# Patient Record
Sex: Female | Born: 1950
Health system: Southern US, Community
[De-identification: ages and names within clinical notes are randomized; demographics above are authoritative.]

## PROBLEM LIST (undated history)

## (undated) DIAGNOSIS — M1990 Unspecified osteoarthritis, unspecified site: Secondary | ICD-10-CM

## (undated) DIAGNOSIS — R011 Cardiac murmur, unspecified: Secondary | ICD-10-CM

## (undated) DIAGNOSIS — K759 Inflammatory liver disease, unspecified: Secondary | ICD-10-CM

## (undated) DIAGNOSIS — Z8679 Personal history of other diseases of the circulatory system: Secondary | ICD-10-CM

## (undated) DIAGNOSIS — I10 Essential (primary) hypertension: Secondary | ICD-10-CM

## (undated) DIAGNOSIS — Z8619 Personal history of other infectious and parasitic diseases: Secondary | ICD-10-CM

## (undated) DIAGNOSIS — M25461 Effusion, right knee: Secondary | ICD-10-CM

## (undated) DIAGNOSIS — Z8719 Personal history of other diseases of the digestive system: Secondary | ICD-10-CM

## (undated) DIAGNOSIS — K222 Esophageal obstruction: Secondary | ICD-10-CM

## (undated) DIAGNOSIS — Z9889 Other specified postprocedural states: Secondary | ICD-10-CM

## (undated) DIAGNOSIS — M25562 Pain in left knee: Secondary | ICD-10-CM

## (undated) DIAGNOSIS — R002 Palpitations: Secondary | ICD-10-CM

## (undated) DIAGNOSIS — Z8661 Personal history of infections of the central nervous system: Secondary | ICD-10-CM

## (undated) DIAGNOSIS — K219 Gastro-esophageal reflux disease without esophagitis: Secondary | ICD-10-CM

## (undated) DIAGNOSIS — H269 Unspecified cataract: Secondary | ICD-10-CM

## (undated) DIAGNOSIS — S83209A Unspecified tear of unspecified meniscus, current injury, unspecified knee, initial encounter: Secondary | ICD-10-CM

## (undated) HISTORY — PX: JOINT REPLACEMENT: SHX530

## (undated) HISTORY — PX: OTHER SURGICAL HISTORY: SHX169

## (undated) HISTORY — PX: APPENDECTOMY: SHX54

## (undated) HISTORY — PX: DILATION AND CURETTAGE OF UTERUS: SHX78

## (undated) HISTORY — PX: TONSILLECTOMY AND ADENOIDECTOMY: SUR1326

## (undated) HISTORY — PX: TUBAL LIGATION: SHX77

---

## 1998-06-28 ENCOUNTER — Emergency Department (HOSPITAL_COMMUNITY): Admission: EM | Admit: 1998-06-28 | Discharge: 1998-06-28 | Payer: Self-pay | Admitting: Emergency Medicine

## 1998-07-15 ENCOUNTER — Encounter: Admission: RE | Admit: 1998-07-15 | Discharge: 1998-08-09 | Payer: Self-pay

## 1998-09-03 ENCOUNTER — Encounter: Admission: RE | Admit: 1998-09-03 | Discharge: 1998-10-25 | Payer: Self-pay

## 1998-11-22 ENCOUNTER — Encounter: Admission: RE | Admit: 1998-11-22 | Discharge: 1999-02-20 | Payer: Self-pay | Admitting: *Deleted

## 1999-03-31 ENCOUNTER — Encounter: Payer: Self-pay | Admitting: Neurological Surgery

## 1999-03-31 ENCOUNTER — Ambulatory Visit (HOSPITAL_COMMUNITY): Admission: RE | Admit: 1999-03-31 | Discharge: 1999-03-31 | Payer: Self-pay | Admitting: Neurological Surgery

## 1999-04-15 ENCOUNTER — Ambulatory Visit (HOSPITAL_COMMUNITY): Admission: RE | Admit: 1999-04-15 | Discharge: 1999-04-15 | Payer: Self-pay | Admitting: Neurological Surgery

## 1999-04-15 ENCOUNTER — Encounter: Payer: Self-pay | Admitting: Neurological Surgery

## 1999-04-29 ENCOUNTER — Encounter: Payer: Self-pay | Admitting: Neurological Surgery

## 1999-04-29 ENCOUNTER — Ambulatory Visit (HOSPITAL_COMMUNITY): Admission: RE | Admit: 1999-04-29 | Discharge: 1999-04-29 | Payer: Self-pay | Admitting: Neurological Surgery

## 1999-04-30 ENCOUNTER — Encounter: Payer: Self-pay | Admitting: Internal Medicine

## 1999-04-30 ENCOUNTER — Ambulatory Visit (HOSPITAL_COMMUNITY): Admission: RE | Admit: 1999-04-30 | Discharge: 1999-04-30 | Payer: Self-pay | Admitting: Internal Medicine

## 1999-05-06 ENCOUNTER — Encounter: Payer: Self-pay | Admitting: Internal Medicine

## 1999-05-06 ENCOUNTER — Ambulatory Visit (HOSPITAL_COMMUNITY): Admission: RE | Admit: 1999-05-06 | Discharge: 1999-05-06 | Payer: Self-pay | Admitting: Internal Medicine

## 1999-10-13 ENCOUNTER — Encounter
Admission: RE | Admit: 1999-10-13 | Discharge: 1999-10-14 | Payer: Self-pay | Admitting: Physical Medicine & Rehabilitation

## 2000-03-29 ENCOUNTER — Other Ambulatory Visit: Admission: RE | Admit: 2000-03-29 | Discharge: 2000-03-29 | Payer: Self-pay | Admitting: Obstetrics and Gynecology

## 2000-05-18 ENCOUNTER — Ambulatory Visit (HOSPITAL_COMMUNITY): Admission: RE | Admit: 2000-05-18 | Discharge: 2000-05-18 | Payer: Self-pay | Admitting: Obstetrics and Gynecology

## 2000-05-18 ENCOUNTER — Encounter: Payer: Self-pay | Admitting: Obstetrics and Gynecology

## 2000-11-16 ENCOUNTER — Encounter: Payer: Self-pay | Admitting: Physical Medicine & Rehabilitation

## 2000-11-16 ENCOUNTER — Encounter
Admission: RE | Admit: 2000-11-16 | Discharge: 2000-11-16 | Payer: Self-pay | Admitting: Physical Medicine & Rehabilitation

## 2001-01-18 ENCOUNTER — Encounter
Admission: RE | Admit: 2001-01-18 | Discharge: 2001-01-18 | Payer: Self-pay | Admitting: Physical Medicine & Rehabilitation

## 2001-01-18 ENCOUNTER — Encounter: Payer: Self-pay | Admitting: Physical Medicine & Rehabilitation

## 2001-02-04 ENCOUNTER — Encounter: Payer: Self-pay | Admitting: Physical Medicine & Rehabilitation

## 2001-02-04 ENCOUNTER — Encounter
Admission: RE | Admit: 2001-02-04 | Discharge: 2001-02-04 | Payer: Self-pay | Admitting: Physical Medicine & Rehabilitation

## 2001-02-25 ENCOUNTER — Encounter
Admission: RE | Admit: 2001-02-25 | Discharge: 2001-02-25 | Payer: Self-pay | Admitting: Physical Medicine & Rehabilitation

## 2001-02-25 ENCOUNTER — Encounter: Payer: Self-pay | Admitting: Physical Medicine & Rehabilitation

## 2001-06-07 ENCOUNTER — Encounter: Payer: Self-pay | Admitting: Obstetrics and Gynecology

## 2001-06-07 ENCOUNTER — Ambulatory Visit (HOSPITAL_COMMUNITY): Admission: RE | Admit: 2001-06-07 | Discharge: 2001-06-07 | Payer: Self-pay | Admitting: Obstetrics and Gynecology

## 2001-06-14 ENCOUNTER — Encounter: Admission: RE | Admit: 2001-06-14 | Discharge: 2001-06-14 | Payer: Self-pay | Admitting: Obstetrics and Gynecology

## 2001-06-14 ENCOUNTER — Encounter: Payer: Self-pay | Admitting: Obstetrics and Gynecology

## 2001-06-20 ENCOUNTER — Other Ambulatory Visit: Admission: RE | Admit: 2001-06-20 | Discharge: 2001-06-20 | Payer: Self-pay | Admitting: Obstetrics and Gynecology

## 2001-08-11 ENCOUNTER — Encounter: Payer: Self-pay | Admitting: Physical Medicine & Rehabilitation

## 2001-08-11 ENCOUNTER — Ambulatory Visit (HOSPITAL_COMMUNITY)
Admission: RE | Admit: 2001-08-11 | Discharge: 2001-08-11 | Payer: Self-pay | Admitting: Physical Medicine & Rehabilitation

## 2001-08-23 ENCOUNTER — Inpatient Hospital Stay (HOSPITAL_COMMUNITY): Admission: EM | Admit: 2001-08-23 | Discharge: 2001-08-25 | Payer: Self-pay | Admitting: Emergency Medicine

## 2001-08-23 ENCOUNTER — Encounter: Payer: Self-pay | Admitting: Emergency Medicine

## 2002-02-14 ENCOUNTER — Encounter: Payer: Self-pay | Admitting: Internal Medicine

## 2002-02-14 ENCOUNTER — Ambulatory Visit (HOSPITAL_COMMUNITY): Admission: RE | Admit: 2002-02-14 | Discharge: 2002-02-14 | Payer: Self-pay | Admitting: Internal Medicine

## 2002-06-23 ENCOUNTER — Other Ambulatory Visit: Admission: RE | Admit: 2002-06-23 | Discharge: 2002-06-23 | Payer: Self-pay | Admitting: Obstetrics and Gynecology

## 2002-06-28 ENCOUNTER — Ambulatory Visit (HOSPITAL_COMMUNITY): Admission: RE | Admit: 2002-06-28 | Discharge: 2002-06-28 | Payer: Self-pay | Admitting: Gastroenterology

## 2002-06-28 ENCOUNTER — Encounter: Payer: Self-pay | Admitting: Gastroenterology

## 2002-07-04 ENCOUNTER — Encounter: Payer: Self-pay | Admitting: Obstetrics and Gynecology

## 2002-07-04 ENCOUNTER — Ambulatory Visit (HOSPITAL_COMMUNITY): Admission: RE | Admit: 2002-07-04 | Discharge: 2002-07-04 | Payer: Self-pay | Admitting: Obstetrics and Gynecology

## 2002-07-05 ENCOUNTER — Encounter: Payer: Self-pay | Admitting: Gastroenterology

## 2002-07-05 ENCOUNTER — Ambulatory Visit (HOSPITAL_COMMUNITY): Admission: RE | Admit: 2002-07-05 | Discharge: 2002-07-05 | Payer: Self-pay | Admitting: Gastroenterology

## 2002-07-13 ENCOUNTER — Ambulatory Visit (HOSPITAL_COMMUNITY): Admission: RE | Admit: 2002-07-13 | Discharge: 2002-07-13 | Payer: Self-pay | Admitting: Gastroenterology

## 2002-07-20 HISTORY — PX: HYSTEROSCOPY WITH D & C: SHX1775

## 2002-07-20 HISTORY — PX: HYSTEROSCOPY W/D&C: SHX1775

## 2002-07-26 ENCOUNTER — Encounter: Payer: Self-pay | Admitting: Physical Medicine & Rehabilitation

## 2002-07-26 ENCOUNTER — Encounter
Admission: RE | Admit: 2002-07-26 | Discharge: 2002-07-26 | Payer: Self-pay | Admitting: Physical Medicine & Rehabilitation

## 2002-08-11 ENCOUNTER — Ambulatory Visit (HOSPITAL_COMMUNITY): Admission: RE | Admit: 2002-08-11 | Discharge: 2002-08-11 | Payer: Self-pay | Admitting: Obstetrics and Gynecology

## 2002-08-11 ENCOUNTER — Encounter (INDEPENDENT_AMBULATORY_CARE_PROVIDER_SITE_OTHER): Payer: Self-pay

## 2002-08-17 ENCOUNTER — Encounter: Payer: Self-pay | Admitting: General Surgery

## 2002-08-19 HISTORY — PX: LAPAROSCOPIC CHOLECYSTECTOMY: SUR755

## 2002-08-22 ENCOUNTER — Encounter: Payer: Self-pay | Admitting: General Surgery

## 2002-08-22 ENCOUNTER — Encounter (INDEPENDENT_AMBULATORY_CARE_PROVIDER_SITE_OTHER): Payer: Self-pay | Admitting: Specialist

## 2002-08-22 ENCOUNTER — Observation Stay (HOSPITAL_COMMUNITY): Admission: RE | Admit: 2002-08-22 | Discharge: 2002-08-23 | Payer: Self-pay | Admitting: General Surgery

## 2002-11-15 ENCOUNTER — Encounter
Admission: RE | Admit: 2002-11-15 | Discharge: 2003-02-13 | Payer: Self-pay | Admitting: Physical Medicine & Rehabilitation

## 2003-03-19 ENCOUNTER — Encounter
Admission: RE | Admit: 2003-03-19 | Discharge: 2003-06-17 | Payer: Self-pay | Admitting: Physical Medicine & Rehabilitation

## 2003-10-01 ENCOUNTER — Other Ambulatory Visit: Admission: RE | Admit: 2003-10-01 | Discharge: 2003-10-01 | Payer: Self-pay | Admitting: Obstetrics and Gynecology

## 2003-10-08 ENCOUNTER — Emergency Department (HOSPITAL_COMMUNITY): Admission: EM | Admit: 2003-10-08 | Discharge: 2003-10-08 | Payer: Self-pay | Admitting: Emergency Medicine

## 2003-11-20 ENCOUNTER — Ambulatory Visit (HOSPITAL_COMMUNITY): Admission: RE | Admit: 2003-11-20 | Discharge: 2003-11-20 | Payer: Self-pay | Admitting: Obstetrics and Gynecology

## 2003-12-19 ENCOUNTER — Encounter
Admission: RE | Admit: 2003-12-19 | Discharge: 2004-03-18 | Payer: Self-pay | Admitting: Physical Medicine & Rehabilitation

## 2004-04-20 DIAGNOSIS — K759 Inflammatory liver disease, unspecified: Secondary | ICD-10-CM

## 2004-04-20 HISTORY — DX: Inflammatory liver disease, unspecified: K75.9

## 2004-06-13 ENCOUNTER — Ambulatory Visit (HOSPITAL_COMMUNITY): Admission: RE | Admit: 2004-06-13 | Discharge: 2004-06-13 | Payer: Self-pay | Admitting: Gastroenterology

## 2004-06-16 ENCOUNTER — Ambulatory Visit: Payer: Self-pay | Admitting: Gastroenterology

## 2004-07-09 ENCOUNTER — Ambulatory Visit: Payer: Self-pay | Admitting: Gastroenterology

## 2004-08-08 ENCOUNTER — Ambulatory Visit: Payer: Self-pay | Admitting: Gastroenterology

## 2004-09-29 ENCOUNTER — Encounter: Admission: RE | Admit: 2004-09-29 | Discharge: 2004-09-29 | Payer: Self-pay | Admitting: Internal Medicine

## 2004-09-30 ENCOUNTER — Other Ambulatory Visit: Admission: RE | Admit: 2004-09-30 | Discharge: 2004-09-30 | Payer: Self-pay | Admitting: Obstetrics and Gynecology

## 2004-11-20 ENCOUNTER — Encounter
Admission: RE | Admit: 2004-11-20 | Discharge: 2005-02-18 | Payer: Self-pay | Admitting: Physical Medicine & Rehabilitation

## 2004-11-20 ENCOUNTER — Ambulatory Visit: Payer: Self-pay | Admitting: Physical Medicine & Rehabilitation

## 2004-11-25 ENCOUNTER — Ambulatory Visit: Payer: Self-pay | Admitting: Gastroenterology

## 2004-12-19 ENCOUNTER — Encounter (INDEPENDENT_AMBULATORY_CARE_PROVIDER_SITE_OTHER): Payer: Self-pay | Admitting: *Deleted

## 2004-12-19 ENCOUNTER — Ambulatory Visit (HOSPITAL_COMMUNITY): Admission: RE | Admit: 2004-12-19 | Discharge: 2004-12-19 | Payer: Self-pay | Admitting: Gastroenterology

## 2005-05-28 ENCOUNTER — Encounter
Admission: RE | Admit: 2005-05-28 | Discharge: 2005-08-26 | Payer: Self-pay | Admitting: Physical Medicine & Rehabilitation

## 2005-05-28 ENCOUNTER — Ambulatory Visit: Payer: Self-pay | Admitting: Physical Medicine & Rehabilitation

## 2005-06-19 ENCOUNTER — Ambulatory Visit: Payer: Self-pay | Admitting: Gastroenterology

## 2005-09-24 ENCOUNTER — Ambulatory Visit: Payer: Self-pay | Admitting: Gastroenterology

## 2005-10-01 ENCOUNTER — Ambulatory Visit: Payer: Self-pay | Admitting: Gastroenterology

## 2005-10-15 ENCOUNTER — Ambulatory Visit: Payer: Self-pay | Admitting: Gastroenterology

## 2005-10-29 ENCOUNTER — Ambulatory Visit: Payer: Self-pay | Admitting: Gastroenterology

## 2005-11-16 ENCOUNTER — Encounter
Admission: RE | Admit: 2005-11-16 | Discharge: 2006-02-14 | Payer: Self-pay | Admitting: Physical Medicine & Rehabilitation

## 2005-11-19 ENCOUNTER — Ambulatory Visit: Payer: Self-pay | Admitting: Gastroenterology

## 2005-12-10 ENCOUNTER — Ambulatory Visit: Payer: Self-pay | Admitting: Gastroenterology

## 2005-12-24 ENCOUNTER — Ambulatory Visit: Payer: Self-pay | Admitting: Gastroenterology

## 2006-01-07 ENCOUNTER — Ambulatory Visit: Payer: Self-pay | Admitting: Gastroenterology

## 2006-02-04 ENCOUNTER — Ambulatory Visit: Payer: Self-pay | Admitting: Gastroenterology

## 2006-02-05 ENCOUNTER — Ambulatory Visit: Payer: Self-pay | Admitting: Physical Medicine & Rehabilitation

## 2006-03-04 ENCOUNTER — Ambulatory Visit: Payer: Self-pay | Admitting: Gastroenterology

## 2006-04-01 ENCOUNTER — Ambulatory Visit: Payer: Self-pay | Admitting: Gastroenterology

## 2006-04-09 ENCOUNTER — Ambulatory Visit (HOSPITAL_COMMUNITY): Admission: RE | Admit: 2006-04-09 | Discharge: 2006-04-09 | Payer: Self-pay | Admitting: Obstetrics and Gynecology

## 2006-04-29 ENCOUNTER — Ambulatory Visit: Payer: Self-pay | Admitting: Gastroenterology

## 2006-05-27 ENCOUNTER — Ambulatory Visit: Payer: Self-pay | Admitting: Gastroenterology

## 2006-06-10 ENCOUNTER — Emergency Department (HOSPITAL_COMMUNITY): Admission: EM | Admit: 2006-06-10 | Discharge: 2006-06-10 | Payer: Self-pay | Admitting: Family Medicine

## 2006-07-22 ENCOUNTER — Ambulatory Visit: Payer: Self-pay | Admitting: Gastroenterology

## 2006-08-24 ENCOUNTER — Ambulatory Visit: Payer: Self-pay | Admitting: Gastroenterology

## 2006-09-21 ENCOUNTER — Ambulatory Visit: Payer: Self-pay | Admitting: Gastroenterology

## 2006-11-18 ENCOUNTER — Ambulatory Visit: Payer: Self-pay | Admitting: Gastroenterology

## 2007-03-03 ENCOUNTER — Ambulatory Visit: Payer: Self-pay | Admitting: Gastroenterology

## 2007-08-11 ENCOUNTER — Ambulatory Visit: Payer: Self-pay | Admitting: Gastroenterology

## 2009-11-24 ENCOUNTER — Emergency Department (HOSPITAL_COMMUNITY): Admission: EM | Admit: 2009-11-24 | Discharge: 2009-11-24 | Payer: Self-pay | Admitting: Family Medicine

## 2010-01-05 ENCOUNTER — Ambulatory Visit (HOSPITAL_COMMUNITY): Admission: RE | Admit: 2010-01-05 | Discharge: 2010-01-05 | Payer: Self-pay | Admitting: Orthopedic Surgery

## 2010-05-11 ENCOUNTER — Encounter: Payer: Self-pay | Admitting: Obstetrics and Gynecology

## 2010-07-17 ENCOUNTER — Inpatient Hospital Stay (INDEPENDENT_AMBULATORY_CARE_PROVIDER_SITE_OTHER)
Admission: RE | Admit: 2010-07-17 | Discharge: 2010-07-17 | Disposition: A | Discharge status: Home / Self Care | Payer: 59 | Source: Ambulatory Visit | Attending: Family Medicine | Admitting: Family Medicine

## 2010-07-17 DIAGNOSIS — J019 Acute sinusitis, unspecified: Secondary | ICD-10-CM

## 2010-09-05 NOTE — Assessment & Plan Note (Signed)
Ms. Eickhoff returns to clinic today for followup evaluation. Overall, she is  doing well. She continues to use minimal amounts of hydrocodone, Ultram,  Neurontin, and Flexeril. She does need refills on each of those in the  office today. She continues to work at [employer] and is doing well there.  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   The patient continues on the Pegasus and ribavirin through the hepatology  office at [facility]. She continues to be seen by [facility] physician, Dr. Foy Guadalajara,  who apparently is a specialist in hepatitis C infection.   MEDICATIONS:  1. Vicodin 5/325 1/2 tablet 1 tablet b.i.d. p.r.n. (usually 0-1 per day).  2. Ultram 50 mg b.i.d. p.r.n. (0-1 per day).  3. Neurontin 300 mg daily.  4. Flexeril 10 mg 1/2 tablet to 1 tablet daily.  5. Nexium 40 mg daily.  6. Spironolactone 25 mg b.i.d.  7. Diovan/hydrochlorothiazide 160/25 one tablet daily.  8. Topamax 50 mg b.i.d.  9. Allegra daily.  10.Ribavirin b.i.d.  11.Pegasus injections weekly.   REVIEW OF SYSTEMS:  Positive for nausea, abdominal pain, and right ear fluid  with most of her side effects secondary to treatment for hepatitis C.   PHYSICAL EXAMINATION:  A well-appearing, fit, adult female in mild-to-no-  acute discomfort. Blood pressure 110/73 with pulse 77, respiratory rate 16,  and O2 saturation 100% on room air. She has 5-/5 strength throughout the  bilateral upper and lower extremities. She has decreased range of motion of  her lumbar spine but ambulates without any assistive device.   IMPRESSION:  1. Moderate lumbar spondylosis with chronic knee pain, improved.  2. Degenerative arthritis of the left knee.   In the office today, we did refill the patient's Ultram, Neurontin,  Flexeril, and Vicodin, each for a total of 3 months. She has a mail-in  through her work at [employer] and  that will be necessary to get it  in a 72-month supply. Will plan on seeing the patient in followup in this  office in approximately 6 months' time with refills prior to that  appointment as necessary.           ______________________________  Ellwood Dense, M.D.     DC/MedQ  D:  02/05/2006 13:00:46  T:  02/07/2006 10:04:35  Job #:  [id number]

## 2010-09-05 NOTE — Assessment & Plan Note (Signed)
HISTORY OF PRESENT ILLNESS:  Ms. Hudock returns to the clinical today for  followup evaluation. She reports that overall, her back and hip pain are  about the same. She continues to get good relief from the medicines but  tries to use them only as absolutely necessary. She apparently had a recent  elevated liver enzymes and was seen by recent Hepatology group. She had a  liver biopsy but is not sure of the results at this point. She has not had  any contact with the people that did the biopsy and that was done back in  September. I have encouraged her to follow up with those people.   MEDICATIONS:  1.  Vicodin 5/325, 1/2 tablet to 1 tablet b.i.d. p.r.n. (usually 0 to 1 per      day).  2.  Ultram 50 mg b.i.d. p.r.n. (0 to 1 per day).  3.  Neurontin 300 mg daily.  4.  Flexeril 10 mg 1/2 tablet to 1 tablet daily.  5.  Nexium 40 mg daily.  6.  Spironolactone 25 mg b.i.d.  7.  Diovan-hydrochlorothiazide 160/25 1 tablet daily.  8.  Topamax 50 mg b.i.d.   PHYSICAL EXAMINATION:  GENERAL:  A reasonably well-appearing adult female.  VITAL SIGNS:  Blood pressure 100/59 with a pulse of 75, respiratory rate 16,  and O2 saturation 100% on room air.  NEUROLOGIC:  She ambulates without any assistive device. Strength was 5  minus over 5 throughout.   IMPRESSION:  1.  Moderate lumbar spondylosis with chronic knee pain, improved.  2.  Degenerative arthritis of the left knee.   PLAN:  In the office today, we did give the patient refill on her  medications and specifically for a 3 month supply including the Ultram,  Neurontin, Flexeril, and Vicodin. We will plan on seeing her in followup in  approximately 6 months' time.           ______________________________  Ellwood Dense, M.D.     DC/MedQ  D:  05/29/2005 16:00:02  T:  05/30/2005 14:15:01  Job #:  [id number]

## 2010-09-05 NOTE — H&P (Signed)
NAME:  Vicki Krueger, CALEF NO.:  [id number]   MEDICAL RECORD NO.:  [id number]                   PATIENT TYPE:  AMB   LOCATION:  SDC                                  FACILITY:  [facility]   PHYSICIAN:  Osborn Coho, M.D.                DATE OF BIRTH:  [birth year]   DATE OF ADMISSION:  DATE OF DISCHARGE:                                HISTORY & PHYSICAL   CHIEF COMPLAINT:  Heavier menstrual cycles for the past 2 years with cycles  sometimes twice per month.   HISTORY OF PRESENT ILLNESS:  The patient is a 60 year old gravida 4, para 1  with last menstrual period of June 23, 2002, who [facility] 23, [facility], complaining of heavier menstrual  cycles for the past 2 years with 2 cycles in May 2003 and no menses since  that time. The patient reported a history of fibroids for approximately 5 to  10 years and had been status post a myomectomy.   More recently the patient reports intramenstrual spotting. An endometrial  biopsy in September 2003 revealed an endocervical polyp and proliferative  type endometrium with breakdown. The patient reported a history of using  Naprosyn. The patient was offered observation versus hysteroscopy and  dilatation and curettage versus sonohystogram at that time.   She opted for observation until March of 2004, when she underwent a  sonohystogram which showed a 1.2 cm echogenic focus in the endocervical  region suggestive of an endocervical polyp. The patient was again offered  observation versus surgical management, and the patient opted for surgical  management. A hysteroscopy dilatation and curettage was discussed with the  patient. The risks, benefits and alternatives were reviewed including but  not limited to the risks of bleeding, infection, injury and uterine  perforation. The patient was scheduled for hysteroscopy dilatation and  curettage on August 11, 2002.   OBSTETRIC HISTORY:  Cesarean  section x1 at 32 weeks secondary to PPROM with  failure to progress.   GYNECOLOGIC HISTORY:  History of regular menses until about 9 months ago  with irregular menses since and heavier bleeding for the past couple of  years. The patient denied a history of gonorrhea, Chlamydia or other  sexually transmitted diseases. She denied a history of hormonal treatment.  Her last Pap smear was in March 2004 which was negative. She denied a  history of abnormal Pap smears. She reported being diagnosed with fibroids  approximately 5 to 10 years ago. She reported a history of an ovarian cyst  years ago with no masses seen on last ultrasound on July 03, 2002. Last  mammogram was in March of 2004, which was negative.   PAST MEDICAL HISTORY:  1. Hypertension.  2. Heart murmur requiring antibiotics with dental procedures.  3. Arthritis.  4. Chronic back pain.  5. Left knee pain.   PAST SURGICAL HISTORY:  1. Cesarean section.  2. Left knee surgery.  3. Bilateral tubal ligation.  4. Diagnostic laparoscopy x3.  5. Appendectomy.  6. Tonsillectomy.  7. Myomectomy.  8. Dilatation and curettage.  9. Gastroesophageal reflux disease.  10.      Questionable irritable bowel syndrome.  11.      Questionable hiatal hernia.  12.      Gallbladder sludge.   MEDICATIONS:  1. Atenolol.  2. Diovan.  3. Norvasc.   SOCIAL HISTORY:  Denies a history of cigarette use, alcohol use, drug use.  She lives with her husband and son.   FAMILY HISTORY:  Father with heart disease and lung cancer.   REVIEW OF SYSTEMS:  Denies a history of chest pain or shortness of breath.  She reports a history of nausea secondary to gallbladder sludge. She reports  occasional intermittent episodes of constipation and diarrhea secondary to  irritable bowel syndrome. She denied a history of urinary symptoms except  occasional episodes of urgency. The patient is in the process of undergoing  a gastrointestinal workup with Dr. Victorino Dike and is scheduled to  undergo  a cholecystectomy on Aug 22, 2002, secondary to gallbladder sludge.   LABORATORY DATA:  In September 2003 TSH was normal at 2.026. Prolactin  normal at 11.1. SSH 17.5. Hematocrit 40.9.   Ultrasound on December 26, 2001, showed a uterus that was  11.8 x 5.6 x 5.6 cm with multiple fibroids, largest measuring  3.2 x 2.6 x 2.7 cm. Endometrial lining measuring 4.9 mm. No adnexal masses.  Endometrial biopsy December 23, 2001, showed proliferative type endometrium  with breakdown and benign endocervical polyp. Sonohystogram on July 03, 2002, showed a uterus measuring  12.4 x 6.4 x 7.6 cm, again with multiple fibroids. A nabothian cyst was  noted on the cervix measuring 3.8 mm. The endometrial lining measured 2.8  mm. No adnexal masses were noted. A 1.2-cm echogenic focus in the  endocervical region suggestive of a polyp. Mammogram July 04, 2002, was  negative. Pap smear June 23, 2002, was negative.   PHYSICAL EXAMINATION:  VITAL SIGNS:  Afebrile, vital signs stable. Blood  pressure has been 130s/80s in the office.  HEENT:  Normal.  NECK:  Thyroid not enlarged.  HEART:  Regular rate and rhythm.  CHEST:  Clear to auscultation bilaterally.  BREASTS:  No masses, discharge, skin changes or nipple retraction.  BACK:  No CVA tenderness.  ABDOMEN:  Soft, nontender without masses.  EXTREMITIES:  Grossly within normal limits.  PELVIC:  External genitalia were normal. Vagina was normal. Cervix was  nontender with no lesions. Uterus approximately 10 to 12 week size,  anteverted, nontender. Adnexa was  nontender without masses.    ASSESSMENT AND PLAN:  The patient is a 60 year old para 1, perimenopausal  female with menometrorrhagia and endocervical polyp on endometrial biopsy  and sonohystogram. She is scheduled to undergo a hysteroscopy dilatation and curettage on August 11, 2002, after the risks, benefits and alternatives were  discussed with the patient as  stated above. Preoperative labs to be done.  Consent to be signed and witnessed the day of the procedure.                                               Osborn Coho, M.D.    AR/MEDQ  D:  08/11/2002  T:  08/11/2002  Job:  [id number]

## 2010-09-05 NOTE — Op Note (Signed)
NAME:  Vicki Krueger, Vicki Krueger                          ACCOUNT NO.:  [id number]   MEDICAL RECORD NO.:  [id number]                   PATIENT TYPE:  AMB   LOCATION:  DAY                                  FACILITY:  [facility]   PHYSICIAN:  Timothy E. Earlene Plater, M.D.              DATE OF BIRTH:  05/09/1950   DATE OF PROCEDURE:  08/22/2002  DATE OF DISCHARGE:                                 OPERATIVE REPORT   PREOPERATIVE DIAGNOSIS:  Cholecystitis with biliary dyskinesia.   POSTOPERATIVE DIAGNOSIS:  Cholecystitis with biliary dyskinesia.   OPERATION PERFORMED:  Laparoscopic cholecystectomy with intraoperative  cholangiogram.   SURGEON:  Timothy E. Earlene Plater, M.D.   ASSISTANT:  Anselm Pancoast. Zachery Dakins, M.D.   ANESTHESIA:  CRNA supervised Dr. Almeta Monas.   INDICATIONS FOR PROCEDURE:  The patient is 52, has hypertension, arthritis,  chronic back pain, obesity, reflux and has had for years, right upper  quadrant pain associated with food intake.  A recent GI work-up by Dr. Victorino Dike was negative except for abnormal HIDA scan.  After careful  consideration and discussion, the patient wishes to proceed with removal of  the gallbladder.  This has been carefully discussed with her.  Her  laboratory data are satisfactory except for elevated SGOT and SGPT today.   DESCRIPTION OF PROCEDURE:  The patient was taken to the operating room and  placed supine.  General endotracheal anesthesia administered.  The abdomen  prepped and draped in the usual fashion.  Marcaine 0.5% with epinephrine was  used prior to each incision.  A horizontal infraumbilical incision was made,  the fascia identified, opened vertically.  The peritoneum entered without  complication, Hasson catheter placed, tied in place with a #1 Vicryl.  Abdomen insufflated.  Peritoneoscopy was carried out showing an enlarged  uterus with fibroids and a prominent right ovary.  These are known features.   A 10 mm trocar placed in the midepigastrium, two 5 mm  trocars in the right  upper quadrant.  The gallbladder was grasped, inspected, appeared normal in  its overall appearance.  Careful dissection at the base of the gallbladder  revealed an anterior cystic artery which was separated from the cystic duct.  The artery was triply clipped and divided.  The cystic duct was dissected  out.  It was quite small.  A clip was placed near the gallbladder on the  cystic duct.  The cystic duct was opened and it was tiny.  I was, however,  able to thread a Reddick catheter into the cystic duct.  The balloon was  inflated and then using real time fluoroscopy with half strength dye, a  cholangiogram performed.  The balloon did partially block the common bile  duct.  It was slightly deflated and the dye then completely opacified the  hepatic ductal system, as well the common duct and common bile duct.  There  was normal anatomy and free flow  of dye to the duodenum.  We thought this  was normal.  The Reddick catheter was removed.  The stump of the cystic duct  was carefully clipped twice.  The duct was divided and then the gallbladder  was removed from the gallbladder bed without incident of complication.  The  gallbladder was removed through the infraumbilical incision, passed off the  field and submitted for pathology.  The gallbladder bed was dry and there  was no bile or blood.  Irrigant was completely clear.  All irrigant, CO2,  instruments and trocars were removed under  direct vision and the procedure was complete.  Instrument and glove counts  correct.  The skin incisions were closed with 3-0 Monocryl and Steri-Strips.  Small bandages applied.  Final count was correct.  The patient tolerated the  procedure well, was awakened and taken to the recovery room in good  condition.                                                Timothy E. Earlene Plater, M.D.    TED/MEDQ  D:  08/22/2002  T:  08/22/2002  Job:  [id number]   cc:   Ulyess Mort, M.D. [facility]    Candyce Churn, M.D.  [address] [place]  [place] [address]  Fax: [phone]

## 2010-09-05 NOTE — Discharge Summary (Signed)
[place]  Patient:    OMAR, ORREGO Visit Number: [id number] MRN: [id number]          Service Type: MED Location: 3000 [id number] Attending Physician:  Pearla Dubonnet Dictated by:   Pearla Dubonnet, M.D. Admit Date:  08/23/2001 Discharge Date: 08/25/2001                             Discharge Summary  DISCHARGE DIAGNOSES: 1. Aseptic meningitis, recurrent. 2. Hypertension. 3. Lumbar and degenerative joint disease. 4. Knee degenerative joint disease. 5. History of migraine headaches. 6. Aortic sclerosis.  DISCHARGE MEDICATIONS:  Neurontin 300 mg b.i.d., Hyzaar 100/25 one daily, Atenolol 50 mg daily, indomethacin 50 mg 1 p.o. b.i.d. p.r.n. headache/neck pain, Protonix 40 mg daily while taking indomethacin, Darvocet-N 100 one orally 2-3 times a day for joint pain after Tylox is finished, Tylox 1-2 orally every 6-8 hours as needed for severe neck pain/headache.  PROCEDURES:  Lumbar puncture, Aug 23, 2001.  HOSPITAL COURSE:  Mrs. Brenn is a very pleasant 60 year old female who had an episode of viral meningitis in the 3s and another one in approximately 1996.  Two days prior to admission, she began to develop headache but interestingly enough had stopped her Vioxx therapy for approximately 48 hours which she takes chronically for DJD of the spine and knees.  She thought that this headache was consistent with prior viral meningitis headaches and came to the [facility] Emergency Room for evaluation and lumbar puncture was consistent with viral meningitis as below.  She was admitted and treated with IV Demerol and IV Toradol with excellent relief of pain over the next 24-48 hours and is now being discharged home on indomethacin and Tylox for continued anti-inflammatory therapy and pain control.  She is eating well, ambulating, and much improved with a supple neck although slightly painful with movement, and she will be followed up  next week in my office.  LABORATORY:  On admission, were as follows:  White count 8000, hemoglobin 15.5, platelets 190,000.  She had 73% polys on white count differential.  Lumbar puncture results reveal the following: 1. CSF had 885 white cells, 23 red cells. 2. Differential and white cells were 81 lymphocytes, 5% polys, and 14%    monocytes. 3. Glucose was normal at 55.  CSF protein was elevated at 194, and gram stain    was negative for organisms. 4. BMET was normal on admission except for a potassium elevated at 5.8 with    specimens hemolyzed.  BMET on the second day of admission, Aug 24, 2001,    revealed a low potassium of 3.0 and with IV replacement and oral    replacement, her potassium was 3.9 on discharge.  Hypertension was controlled while hospitalized.  Her menses did start while hospitalized.  On discharge, again, her neck was supple.  Her headache was much improved and she was to be discharged home on the above medications and to follow up with me in approximately 1 week.  She may return to work on Aug 29, 2001, if headache is well controlled, off Indocin and Tylox.  She will call me in the interim if symptoms worsen or if any visual changes, nausea, vomiting, or severe headache occurs.   Dictated by:   Pearla Dubonnet, M.D. Attending Physician:  Pearla Dubonnet DD:  08/25/01 TD:  08/26/01 Job: [id number]

## 2010-09-05 NOTE — Assessment & Plan Note (Signed)
Ms. Layne returns to the clinic today for follow-up evaluation.  She does  report that she has had some increased back pain recently as her mother has  been hospitalized at the hospital for recent TIA/stroke.  The patient has  been trying to  help her in the room as much as possible.  Apparently, the  patient's mother is doing reasonably well but is planning to move to the  rehabilitation unit today.   The patient continues to use her below noted medications with good relief.   MEDICATIONS:  1. Atenolol 50 mg daily.  2. Norvasc 5 mg daily.  3. Diovan/hydrochlorothiazide 160/25 one tablet daily.  4. Ultram 50 mg b.i.d. p.r.n.  5. Neurontin 300 mg b.i.d.  6. Flexeril 10 mg one tablet daily p.r.n.  7. Darvocet-N 100 one to two tablets p.o. daily p.r.n.   PHYSICAL EXAMINATION:  A well-appearing moderately overweight adult female.  Blood pressure 111/77 with pulse 63 and O2 saturation 98% on room air.  She  has strength of 5-/5 throughout the bilateral upper and lower extremities.  Bulk and tone were normal and reflexes were 2+ and symmetrical.  The patient  ambulates without any assistive device.   IMPRESSION:  1. Moderate lumbar spondylosis with chronic low back pain.  2. Degenerative arthritis of the left knee.   In the office today, I did refill her Ultram, Darvocet, Flexeril, and  Neurontin medications.  She continues to do well on these medications  without adverse side effects.  I will plan on seeing her in follow-up in  approximately six months' time.      Ellwood Dense, M.D.   DC/MedQ  D:  05/11/2003 10:47:03  T:  05/11/2003 11:14:10  Job #:  [id number]

## 2010-09-05 NOTE — Op Note (Signed)
   NAME:  Vicki Krueger, Vicki Krueger                          ACCOUNT NO.:  [id number]   MEDICAL RECORD NO.:  [id number]                   PATIENT TYPE:  AMB   LOCATION:  SDC                                  FACILITY:  [facility]   PHYSICIAN:  Osborn Coho, M.D.                DATE OF BIRTH:  02-Dec-1950   DATE OF PROCEDURE:  08/11/2002  DATE OF DISCHARGE:                                 OPERATIVE REPORT   PREOPERATIVE DIAGNOSES:  1. Menometrorrhagia.  2. Perimenopause.  3. Endocervical polyp.   POSTOPERATIVE DIAGNOSES:  1. Menometrorrhagia.  2. Perimenopause.   PROCEDURE:  1. Hysteroscopy.  2. Dilatation and curettage.   ANESTHESIA:  General.   ATTENDING:  Osborn Coho, M.D.   FLUIDS:  500 mL.   HYSTEROSCOPIC FLUID DEFICIT:  80 mL.   URINE OUTPUT:  Approximately 50 mL via straight catheter prior to procedure.   COMPLICATIONS:  None.   ESTIMATED BLOOD LOSS:  Minimal, less than 10 mL.   PATHOLOGY:  Endometrial curetting.   PROCEDURE:  The patient was taken to the operating room after the risks,  benefits, and alternatives discussed with patient.  The patient verbalized  understanding and consent signed and witnessed.  The patient was placed  under general anesthesia and prepped and draped in a normal sterile fashion  in a dorsal lithotomy position.  A bivalve speculum was placed in the  patient's vagina and the anterior lip of the cervix grasped with a single  tooth tenaculum.  A paracervical block was administered using approximately  10 mL of 1% lidocaine.  The cervix was dilated for passage of the diagnostic  hysteroscope.  Diagnostic hysteroscope was introduced and no obvious lesions  identified.  Curettage was then performed and a gritty texture noted  throughout.  Diagnostic hysteroscope was introduced again  and again no lesions noted and particular attention paid to the endocervical  region where there were no lesions identified.  Instruments were removed.  Sponge count was  correct.  The patient tolerated procedure well and was  returned to the recovery room in stable condition.                                               Osborn Coho, M.D.    AR/MEDQ  D:  08/11/2002  T:  08/11/2002  Job:  [id number]

## 2010-09-05 NOTE — Assessment & Plan Note (Signed)
INTERVAL HISTORY:  Vicki Krueger returns to clinic today for followup  evaluation.  Overall she reports that she is doing well.  She has had some  recent medical problems including a recurrence of viral meningitis in June  2005.  That apparently had been a problem for her with the last occurrence  being in 2003.  She reports that she has also lost about 50 pounds of weight  and that that seems to be helping her with her overall pain.  She does use  the medicines as listed below but on a less frequent basis compared to  previously.  She would like to have something in place of her Darvocet as  that does not seem to give her relief.  She has tried Percocet in the past  but the oxycodone seems to not agree with her.   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  She reports that she has minimal lifting required there, although  she does have to push some heavier patients in wheelchairs on a regular  basis.   MEDICATIONS:  1.  Darvocet-N 100 one to two tablets once daily p.r.n.  2.  Neurontin 300 mg b.i.d.  3.  Ultram 50 mg b.i.d. p.r.n.  4.  Nexium 40 mg once daily.  5.  Spironolactone 25 mg b.i.d.  6.  Diovan/hydrochlorothiazide 160/25 one tablet once daily.  7.  Flexeril 10 mg one half to one tablet once daily p.r.n.  8.  Topamax 50 mg once daily to be b.i.d.   PHYSICAL EXAMINATION:  Well-appearing slightly overweight adult female.  Blood pressure and vitals are not obtained in the office today.  The patient  has 5-/5 strength throughout the bilateral upper and lower extremities.  Bulk and tone are normal and reflexes are 2+ and symmetrical.  She ambulates  without any assistive device.   IMPRESSION:  1.  Moderate lumbar spondylosis with chronic knee pain, improved.  2.  Degenerative arthritis of the left knee.   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  She reports that  has helped her with  her knee pain and also the weight loss has helped.  She also reports that  her headache pain is less significant and that her low back pain is less  significant.  All of these have resulted in her using less of her pain  medicines.  She would like to have a refill on each of them but she  continues to use minimal amounts of each.  We have refilled the Ultram,  Neurontin, Flexeril in the office today.  I have also given her a  prescription for Vicodin which she is to use one half tablet to one tablet  p.o. once daily p.r.n. in place of her Darvocet.  I will plan on seeing the  patient in followup in approximately 6 months' time.       DC/MedQ  D:  02/04/2004 15:56:04  T:  02/04/2004 20:41:16  Job #:  [id number]

## 2010-09-05 NOTE — Assessment & Plan Note (Signed)
Ms. Vicki Krueger returns to the clinic today for follow-up evaluation.  She  relates that overall she is doing well.  She is using the Neurontin daily  along with her Ultram twice a day.  She is getting better relief with the  Vicodin compared to the Darvocet that she was on previously.  She takes one-  half to one tablet of Vicodin twice a day as needed.  She also needs a  refill on her Flexeril.   The patient continues to work at short stay at [employer].  She is  not involved in much lifting at the present time.  She does occasionally  have to push a wheelchair.   MEDICATIONS:  1. Vicodin 5/325 mg one-half tablet to one tablet p.o. b.i.d. p.r.n.  2. Ultram 50 mg b.i.d. p.r.n.  3. Neurontin 300 mg daily.  4. Flexeril 10 mg one-half tablet to one tablet daily.  5. Nexium 40 mg.  6. Spironolactone 25 mg b.i.d.  7. Diovan/hydrochlorothiazide 160/25 mg one tablet daily.  8. Topamax 50 mg one tablet b.i.d.   PHYSICAL EXAMINATION:  GENERAL:  A well-appearing, mildly overweight adult  female in no acute discomfort.  VITAL SIGNS:  Blood pressure 114/62 with a pulse of 63, respiratory rate of  16, and O2 saturation 97% on room air.  NEUROLOGIC/MUSCULOSKELETAL:  She ambulates without any assistive device.  Strength was 5-/5 throughout.   IMPRESSION:  1. Moderate lumbar spondylosis with chronic knee pain, improved.  2. Degenerative arthritis of the left knee.   In the office today, we did refill the patient's Neurontin, Ultram, Vicodin  and Flexeril medications.  Will plan on seeing her in follow-up in  approximately six months' time with refills to medication prior to that time  as necessary.       DC/MedQ  D:  11/24/2004 14:24:13  T:  11/25/2004 84:69:62  Job #:  [id number]

## 2010-09-05 NOTE — H&P (Signed)
[facility]  Patient:    Vicki Krueger, Vicki Krueger Visit Number: [id number] MRN: [id number]          Service Type: MED Location: 3000 [id number] Attending Physician:  Pearla Dubonnet Dictated by:   Pearla Dubonnet, M.D. Admit Date:  08/23/2001   CC:         Janine Limbo, M.D.   History and Physical  DATE OF BIRTH:  11-16-1950  CHIEF COMPLAINT:  Headache.  HISTORY OF PRESENT ILLNESS:  Vicki Krueger is a very pleasant 60 year old female with a history of viral meningitis x2 in the past.  She had it in the 1980s and in the 1990s.  She now presents with two days of headache consistent with prior meningitis headaches.  The patient has had no significant fever or chills, just developed a headache about two days ago and started to notice some soreness in her neck.  She came to the [facility] ER and was worked up and her lumbar puncture was consistent with viral meningitis, with lymphocytosis and an elevated protein.  She has been requiring intravenous Demerol and Toradol for pain relief in the emergency room and is admitted now for further observation and intravenous therapy.  PAST MEDICAL HISTORY:  1. Hypertension.  2. Viral meningitis x2 in the past.  3. Migraine headaches.  4. Aortic sclerosis by auscultation.  5. Degenerative disk disease of the lumbar spine.  6. Lower extremity venous insufficiency with superficial varicosities.  7. DJD of the knees.  8. Fibrocystic breast disease.  9. History of peptic ulcer disease in the distant past. 10. Mild bilateral premature cataracts.  PAST SURGICAL HISTORY:  1. C-section in 1989.  2. Appendectomy.  3. Tonsillectomy.  4. Knee surgery x2 in the past.  5. Ectopic pregnancy in 1985 with apparent removal of a fallopian tube.  6. Exploratory laparotomy for abdominal pain in 1978 and 1984, reason was     never found for pain.  HOSPITALIZATIONS:  In 1980 for meningitis, 1996 for viral  meningitis and 1980 for hepatitis A.  HEALTH MAINTENANCE:  Performed by Dr. Janine Limbo.  ALLERGIES:  AFRIN, DOXYCYCLINE, STADOL, SUDAFED, ACTIFED and MORPHINE SULFATE.  MEDICATIONS:  1. Atenolol 50 mg daily.  2. Hyzaar 100/25 one daily.  3. Vioxx 25 mg daily -- she has been on this for two years.  4. Neurontin 300 mg b.i.d.  FAMILY HISTORY:  Family history is significant for lung cancer in her father, who died several years ago.  He had an MI eight years ago.  He also had type 2 diabetes mellitus.  Mother is 6 with hypertension and osteoarthritis. Five brothers apparently in good health.  HABITS:  No smoking.  No alcohol.  SOCIAL HISTORY:  The patient works as a Engineer, civil (consulting) at [employer]. She is married to [employer].  She does have a son.  REVIEW OF SYSTEMS:  Noncontributory except as above.  Has had some mild nausea.  No change in vision.  She does get left lower extremity sciatica intermittently and Vioxx has been used for this almost on a daily basis, which also treats her knee pain and low back pain.  PHYSICAL EXAMINATION:  GENERAL:  Moderately obese female in no acute distress.  She has mild-to-moderate soreness in her neck with chin tuck, complaining of severe headache that she had earlier is improved after intravenous Toradol and Demerol.  VITAL SIGNS:  Stable.  She is afebrile.  HEENT:  Exam reveals her to be atraumatic and normocephalic.  Extraocular movements are intact.  Cranial nerves intact.  NECK:  As above -- not stiff but moderate pain with chin tuck.  CHEST:  Clear to auscultation.  CARDIAC:  Regular rate and rhythm with a 2/6 systolic ejection murmur over the right second intercostal space consistent with aortic sclerosis.  No prolonged ejection phase.  ABDOMEN:  Soft, nontender, but normal bowel sounds.  No organomegaly noted to palpation.  No bruits.  EXTREMITIES:  Without cyanosis, clubbing or edema.  NEUROLOGIC:   Neurological reveals her to be oriented x3, alert, oriented, nonfocal exam, except she does have some soreness in her neck with chin tuck.  LABORATORY AND ACCESSORY DATA:  White count 8000, hemoglobin 15.5, platelet count 190,000; 72% polys are noted.  Sodium 138, potassium 5.8 -- hemolyzed, chloride 102, bicarb 29, BUN 11, creatinine 0.7, blood sugar 125.  Verbal report of head CT from the [facility] ER was within normal limits.  Cerebrospinal fluid analysis after lumbar puncture -- 885 white cells, 23 red cells; differential on white cell count was 81% lymphocytes, 5% PMNs and 15% monocytes.  Glucose was normal at 55.  CSF protein was elevated at 195 and Grams stain was negative for organisms.  ASSESSMENT:  A 60 year old female with the third episode of aseptic/viral meningitis in the last 20 years.  I am wondering about the use of Vioxx in central nervous system side-effects but I suspect this is simply secondary to a viral etiology.  PLAN:  1. Treat with IV Toradol and Demerol.  2. IV fluids.  3. Discontinue Vioxx.  4. Continue antihypertensive medications. Dictated by:   Pearla Dubonnet, M.D. Attending Physician:  Pearla Dubonnet DD:  08/24/01 TD:  08/25/01 Job: [id number]

## 2011-03-09 ENCOUNTER — Ambulatory Visit
Admission: RE | Admit: 2011-03-09 | Discharge: 2011-03-09 | Disposition: A | Discharge status: Home / Self Care | Payer: Self-pay | Source: Ambulatory Visit | Attending: *Deleted | Admitting: *Deleted

## 2011-03-09 ENCOUNTER — Other Ambulatory Visit: Payer: Self-pay | Admitting: *Deleted

## 2011-03-09 DIAGNOSIS — M25561 Pain in right knee: Secondary | ICD-10-CM

## 2011-03-30 ENCOUNTER — Ambulatory Visit
Payer: PRIVATE HEALTH INSURANCE | Attending: Family Medicine | Admitting: Rehabilitative and Restorative Service Providers"

## 2011-03-30 DIAGNOSIS — M6281 Muscle weakness (generalized): Secondary | ICD-10-CM | POA: Insufficient documentation

## 2011-03-30 DIAGNOSIS — M25619 Stiffness of unspecified shoulder, not elsewhere classified: Secondary | ICD-10-CM | POA: Insufficient documentation

## 2011-03-30 DIAGNOSIS — IMO0001 Reserved for inherently not codable concepts without codable children: Secondary | ICD-10-CM | POA: Insufficient documentation

## 2011-03-30 DIAGNOSIS — M25519 Pain in unspecified shoulder: Secondary | ICD-10-CM | POA: Insufficient documentation

## 2011-03-31 ENCOUNTER — Ambulatory Visit: Payer: Self-pay | Admitting: Rehabilitative and Restorative Service Providers"

## 2011-04-08 ENCOUNTER — Encounter: Payer: PRIVATE HEALTH INSURANCE | Admitting: Physical Therapy

## 2011-04-16 ENCOUNTER — Ambulatory Visit: Payer: PRIVATE HEALTH INSURANCE | Admitting: Physical Therapy

## 2011-04-20 ENCOUNTER — Ambulatory Visit: Payer: PRIVATE HEALTH INSURANCE | Admitting: Rehabilitative and Restorative Service Providers"

## 2011-04-24 ENCOUNTER — Ambulatory Visit: Payer: PRIVATE HEALTH INSURANCE | Attending: Family Medicine | Admitting: Physical Therapy

## 2011-04-24 DIAGNOSIS — M25619 Stiffness of unspecified shoulder, not elsewhere classified: Secondary | ICD-10-CM | POA: Insufficient documentation

## 2011-04-24 DIAGNOSIS — IMO0001 Reserved for inherently not codable concepts without codable children: Secondary | ICD-10-CM | POA: Insufficient documentation

## 2011-04-24 DIAGNOSIS — M25519 Pain in unspecified shoulder: Secondary | ICD-10-CM | POA: Insufficient documentation

## 2011-04-24 DIAGNOSIS — M6281 Muscle weakness (generalized): Secondary | ICD-10-CM | POA: Insufficient documentation

## 2011-04-28 ENCOUNTER — Ambulatory Visit: Payer: PRIVATE HEALTH INSURANCE | Admitting: Physical Therapy

## 2011-05-01 ENCOUNTER — Other Ambulatory Visit (HOSPITAL_COMMUNITY): Payer: Self-pay | Admitting: Family Medicine

## 2011-05-01 DIAGNOSIS — R52 Pain, unspecified: Secondary | ICD-10-CM

## 2011-05-04 ENCOUNTER — Encounter: Payer: PRIVATE HEALTH INSURANCE | Admitting: Physical Therapy

## 2011-05-07 ENCOUNTER — Ambulatory Visit (HOSPITAL_COMMUNITY)
Admission: RE | Admit: 2011-05-07 | Discharge: 2011-05-07 | Disposition: A | Discharge status: Home / Self Care | Payer: PRIVATE HEALTH INSURANCE | Source: Ambulatory Visit | Attending: Family Medicine | Admitting: Family Medicine

## 2011-05-07 ENCOUNTER — Encounter: Payer: PRIVATE HEALTH INSURANCE | Admitting: Physical Therapy

## 2011-05-07 DIAGNOSIS — X58XXXA Exposure to other specified factors, initial encounter: Secondary | ICD-10-CM | POA: Insufficient documentation

## 2011-05-07 DIAGNOSIS — M25469 Effusion, unspecified knee: Secondary | ICD-10-CM | POA: Insufficient documentation

## 2011-05-07 DIAGNOSIS — M7120 Synovial cyst of popliteal space [Baker], unspecified knee: Secondary | ICD-10-CM | POA: Insufficient documentation

## 2011-05-07 DIAGNOSIS — S83289A Other tear of lateral meniscus, current injury, unspecified knee, initial encounter: Secondary | ICD-10-CM | POA: Insufficient documentation

## 2011-05-07 DIAGNOSIS — R52 Pain, unspecified: Secondary | ICD-10-CM

## 2011-05-07 DIAGNOSIS — M1710 Unilateral primary osteoarthritis, unspecified knee: Secondary | ICD-10-CM | POA: Insufficient documentation

## 2011-05-11 ENCOUNTER — Encounter: Payer: PRIVATE HEALTH INSURANCE | Admitting: Physical Therapy

## 2011-05-13 ENCOUNTER — Encounter: Payer: PRIVATE HEALTH INSURANCE | Admitting: Physical Therapy

## 2011-06-08 ENCOUNTER — Other Ambulatory Visit: Payer: Self-pay | Admitting: Pain Medicine

## 2011-06-09 ENCOUNTER — Encounter (HOSPITAL_BASED_OUTPATIENT_CLINIC_OR_DEPARTMENT_OTHER): Payer: Self-pay | Admitting: *Deleted

## 2011-06-09 NOTE — Progress Notes (Signed)
NPO AFTER MN WITH EXCEPTION CLEAR LIQUIDS UNTIL 0800 (NO CREAM/ MILK PRODUCTS)  ARRIVES AT 1215. NEEDS HG, CMET, AND 12 LEAD. WILL TAKE PRILOSEC, TOPAMAX AND IF NEEDED ULTRAM AM OF SURG W/ SIP OF WATER.

## 2011-06-11 ENCOUNTER — Ambulatory Visit (HOSPITAL_BASED_OUTPATIENT_CLINIC_OR_DEPARTMENT_OTHER)
Admission: RE | Admit: 2011-06-11 | Discharge: 2011-06-11 | Disposition: A | Discharge status: Home / Self Care | Payer: PRIVATE HEALTH INSURANCE | Source: Ambulatory Visit | Attending: Specialist | Admitting: Specialist

## 2011-06-11 ENCOUNTER — Ambulatory Visit (HOSPITAL_BASED_OUTPATIENT_CLINIC_OR_DEPARTMENT_OTHER): Payer: PRIVATE HEALTH INSURANCE | Admitting: Anesthesiology

## 2011-06-11 ENCOUNTER — Encounter (HOSPITAL_BASED_OUTPATIENT_CLINIC_OR_DEPARTMENT_OTHER)
Admission: RE | Disposition: A | Discharge status: Home / Self Care | Payer: Self-pay | Source: Ambulatory Visit | Attending: Specialist

## 2011-06-11 ENCOUNTER — Other Ambulatory Visit: Payer: Self-pay

## 2011-06-11 ENCOUNTER — Encounter (HOSPITAL_BASED_OUTPATIENT_CLINIC_OR_DEPARTMENT_OTHER): Payer: Self-pay | Admitting: *Deleted

## 2011-06-11 ENCOUNTER — Encounter (HOSPITAL_BASED_OUTPATIENT_CLINIC_OR_DEPARTMENT_OTHER): Payer: Self-pay | Admitting: Anesthesiology

## 2011-06-11 DIAGNOSIS — M2240 Chondromalacia patellae, unspecified knee: Secondary | ICD-10-CM | POA: Insufficient documentation

## 2011-06-11 DIAGNOSIS — Z79899 Other long term (current) drug therapy: Secondary | ICD-10-CM | POA: Insufficient documentation

## 2011-06-11 DIAGNOSIS — X58XXXA Exposure to other specified factors, initial encounter: Secondary | ICD-10-CM | POA: Insufficient documentation

## 2011-06-11 DIAGNOSIS — M1710 Unilateral primary osteoarthritis, unspecified knee: Secondary | ICD-10-CM | POA: Insufficient documentation

## 2011-06-11 DIAGNOSIS — I10 Essential (primary) hypertension: Secondary | ICD-10-CM | POA: Insufficient documentation

## 2011-06-11 DIAGNOSIS — M2390 Unspecified internal derangement of unspecified knee: Secondary | ICD-10-CM | POA: Insufficient documentation

## 2011-06-11 DIAGNOSIS — S83289A Other tear of lateral meniscus, current injury, unspecified knee, initial encounter: Secondary | ICD-10-CM | POA: Insufficient documentation

## 2011-06-11 DIAGNOSIS — K219 Gastro-esophageal reflux disease without esophagitis: Secondary | ICD-10-CM | POA: Insufficient documentation

## 2011-06-11 DIAGNOSIS — Z9889 Other specified postprocedural states: Secondary | ICD-10-CM

## 2011-06-11 HISTORY — DX: Personal history of infections of the central nervous system: Z86.61

## 2011-06-11 HISTORY — DX: Pain in left knee: M25.562

## 2011-06-11 HISTORY — DX: Effusion, right knee: M25.461

## 2011-06-11 HISTORY — DX: Unspecified cataract: H26.9

## 2011-06-11 HISTORY — DX: Unspecified tear of unspecified meniscus, current injury, unspecified knee, initial encounter: S83.209A

## 2011-06-11 HISTORY — PX: KNEE ARTHROSCOPY: SHX127

## 2011-06-11 HISTORY — DX: Essential (primary) hypertension: I10

## 2011-06-11 HISTORY — DX: Personal history of other diseases of the digestive system: Z87.19

## 2011-06-11 HISTORY — DX: Gastro-esophageal reflux disease without esophagitis: K21.9

## 2011-06-11 HISTORY — DX: Personal history of other diseases of the circulatory system: Z86.79

## 2011-06-11 HISTORY — DX: Personal history of other infectious and parasitic diseases: Z86.19

## 2011-06-11 HISTORY — DX: Unspecified osteoarthritis, unspecified site: M19.90

## 2011-06-11 LAB — COMPREHENSIVE METABOLIC PANEL
ALT: 14 U/L (ref 0–35)
AST: 34 U/L (ref 0–37)
CO2: 25 mEq/L (ref 19–32)
Calcium: 9.5 mg/dL (ref 8.4–10.5)
Chloride: 103 mEq/L (ref 96–112)
Creatinine, Ser: 0.86 mg/dL (ref 0.50–1.10)
GFR calc Af Amer: 84 mL/min — ABNORMAL LOW (ref >90)
GFR calc non Af Amer: 73 mL/min — ABNORMAL LOW (ref >90)
Glucose, Bld: 92 mg/dL (ref 70–99)
Total Bilirubin: 0.4 mg/dL (ref 0.3–1.2)

## 2011-06-11 SURGERY — ARTHROSCOPY, KNEE
Anesthesia: Monitor Anesthesia Care | Site: Knee | Wound class: Clean

## 2011-06-11 MED ORDER — HYDROCODONE-ACETAMINOPHEN 5-325 MG PO TABS
1.0000 | ORAL_TABLET | ORAL | Status: DC | PRN
  Administered 2011-06-11: 1 via ORAL

## 2011-06-11 MED ORDER — POVIDONE-IODINE 7.5 % EX SOLN: Freq: Once | CUTANEOUS | Status: DC

## 2011-06-11 MED ORDER — CEPHALEXIN 500 MG PO CAPS: 500.0000 mg | ORAL_CAPSULE | Freq: Three times a day (TID) | ORAL | Status: AC

## 2011-06-11 MED ORDER — SODIUM CHLORIDE 0.9 % IV SOLN: INTRAVENOUS | Status: DC

## 2011-06-11 MED ORDER — HYDROCODONE-ACETAMINOPHEN 5-325 MG PO TABS: 1.0000 | ORAL_TABLET | ORAL | Status: AC | PRN

## 2011-06-11 MED ORDER — MIDAZOLAM HCL 2 MG/2ML IJ SOLN
2.0000 mg | Freq: Once | INTRAMUSCULAR | Status: AC
  Administered 2011-06-11: 2 mg via INTRAVENOUS

## 2011-06-11 MED ORDER — PROPOFOL 10 MG/ML IV EMUL
INTRAVENOUS | Status: DC | PRN
  Administered 2011-06-11: 30 mg via INTRAVENOUS

## 2011-06-11 MED ORDER — FENTANYL CITRATE 0.05 MG/ML IJ SOLN
INTRAMUSCULAR | Status: DC | PRN
  Administered 2011-06-11: 100 ug via INTRAVENOUS

## 2011-06-11 MED ORDER — FENTANYL CITRATE 0.05 MG/ML IJ SOLN
100.0000 ug | Freq: Once | INTRAMUSCULAR | Status: AC
  Administered 2011-06-11: 100 ug via INTRAVENOUS

## 2011-06-11 MED ORDER — SODIUM CHLORIDE 0.9 % IJ SOLN
INTRAMUSCULAR | Status: DC | PRN
  Administered 2011-06-11: 30 mL

## 2011-06-11 MED ORDER — CEFAZOLIN SODIUM-DEXTROSE 2-3 GM-% IV SOLR
2.0000 g | INTRAVENOUS | Status: AC
  Administered 2011-06-11: 2 g via INTRAVENOUS

## 2011-06-11 MED ORDER — MIDAZOLAM HCL 5 MG/5ML IJ SOLN
INTRAMUSCULAR | Status: DC | PRN
  Administered 2011-06-11: 2 mg via INTRAVENOUS

## 2011-06-11 MED ORDER — ONDANSETRON HCL 4 MG/2ML IJ SOLN
INTRAMUSCULAR | Status: DC | PRN
  Administered 2011-06-11: 4 mg via INTRAVENOUS

## 2011-06-11 MED ORDER — LACTATED RINGERS IV SOLN
INTRAVENOUS | Status: DC
  Administered 2011-06-11 (×2): via INTRAVENOUS

## 2011-06-11 MED ORDER — PROPOFOL 10 MG/ML IV EMUL
INTRAVENOUS | Status: DC | PRN
  Administered 2011-06-11: 75 ug/kg/min via INTRAVENOUS

## 2011-06-11 MED ORDER — BUPIVACAINE HCL (PF) 0.25 % IJ SOLN
INTRAMUSCULAR | Status: DC | PRN
  Administered 2011-06-11: 20 mL

## 2011-06-11 SURGICAL SUPPLY — 45 items
BANDAGE ESMARK 6X9 LF (GAUZE/BANDAGES/DRESSINGS) IMPLANT
BANDAGE GAUZE ELAST BULKY 4 IN (GAUZE/BANDAGES/DRESSINGS) ×4 IMPLANT
BLADE 4.2CUDA (BLADE) IMPLANT
BLADE CUDA GRT WHITE 3.5 (BLADE) ×2 IMPLANT
BLADE CUDA SHAVER 3.5 (BLADE) ×2 IMPLANT
BNDG CMPR 9X6 STRL LF SNTH (GAUZE/BANDAGES/DRESSINGS)
BNDG ESMARK 6X9 LF (GAUZE/BANDAGES/DRESSINGS)
CANISTER SUCT LVC 12 LTR MEDI- (MISCELLANEOUS) ×4 IMPLANT
CANISTER SUCTION 1200CC (MISCELLANEOUS) ×2 IMPLANT
CLOTH BEACON ORANGE TIMEOUT ST (SAFETY) ×2 IMPLANT
DRAPE ARTHROSCOPY W/POUCH 114 (DRAPES) ×2 IMPLANT
DRAPE INCISE 23X17 IOBAN STRL (DRAPES) ×1
DRAPE INCISE IOBAN 23X17 STRL (DRAPES) ×1 IMPLANT
DRSG PAD ABDOMINAL 8X10 ST (GAUZE/BANDAGES/DRESSINGS) ×2 IMPLANT
DURAPREP 26ML APPLICATOR (WOUND CARE) ×2 IMPLANT
ELECT MENISCUS 165MM 90D (ELECTRODE) IMPLANT
ELECT REM PT RETURN 9FT ADLT (ELECTROSURGICAL)
ELECTRODE REM PT RTRN 9FT ADLT (ELECTROSURGICAL) IMPLANT
GAUZE XEROFORM 1X8 LF (GAUZE/BANDAGES/DRESSINGS) ×2 IMPLANT
GLOVE ECLIPSE 7.0 STRL STRAW (GLOVE) ×2 IMPLANT
GLOVE INDICATOR 7.0 STRL GRN (GLOVE) ×2 IMPLANT
GLOVE INDICATOR 8.0 STRL GRN (GLOVE) ×4 IMPLANT
GLOVE SURG ORTHO 8.0 STRL STRW (GLOVE) ×4 IMPLANT
GOWN PREVENTION PLUS LG XLONG (DISPOSABLE) ×2 IMPLANT
GOWN STRL REIN XL XLG (GOWN DISPOSABLE) ×4 IMPLANT
IMMOBILIZER KNEE 22 UNIV (SOFTGOODS) IMPLANT
IMMOBILIZER KNEE 24 THIGH 36 (MISCELLANEOUS) IMPLANT
IMMOBILIZER KNEE 24 UNIV (MISCELLANEOUS)
IV NS IRRIG 3000ML ARTHROMATIC (IV SOLUTION) ×4 IMPLANT
KNEE WRAP E Z 3 GEL PACK (MISCELLANEOUS) ×2 IMPLANT
MINI VAC (SURGICAL WAND) ×2 IMPLANT
PACK ARTHROSCOPY DSU (CUSTOM PROCEDURE TRAY) ×2 IMPLANT
PACK BASIN DAY SURGERY FS (CUSTOM PROCEDURE TRAY) ×2 IMPLANT
PADDING CAST ABS 4INX4YD NS (CAST SUPPLIES) ×1
PADDING CAST ABS COTTON 4X4 ST (CAST SUPPLIES) ×1 IMPLANT
PENCIL BUTTON HOLSTER BLD 10FT (ELECTRODE) IMPLANT
SET ARTHROSCOPY TUBING (MISCELLANEOUS) ×2
SET ARTHROSCOPY TUBING LN (MISCELLANEOUS) ×1 IMPLANT
SPONGE GAUZE 4X4 12PLY (GAUZE/BANDAGES/DRESSINGS) ×2 IMPLANT
SUT ETHILON 3 0 FSL (SUTURE) ×2 IMPLANT
SYR CONTROL 10ML LL (SYRINGE) ×2 IMPLANT
TOWEL OR 17X24 6PK STRL BLUE (TOWEL DISPOSABLE) ×2 IMPLANT
WAND 30 DEG SABER W/CORD (SURGICAL WAND) IMPLANT
WAND 90 DEG TURBOVAC W/CORD (SURGICAL WAND) IMPLANT
WATER STERILE IRR 500ML POUR (IV SOLUTION) ×2 IMPLANT

## 2011-06-11 NOTE — Op Note (Signed)
Preop diagnosis right knee torn lateral meniscus osteoarthritis Postoperative diagnosis right knee extensive tear lateral meniscus. Diffuse grade 4 chondromalacia lateral compartment. Advanced chondromalacia of patellofemoral joint. Mild chondromalacia medial compartment. Procedure right knee arthroscopic partial lateral meniscectomy, chondroplasty of lateral and patellofemoral joint Surgeon Valma Cava M.D. Assistant Nadine Counts [facility] Anesthesia knee block or with monitored anesthesia care Afebrile loss less than 10 cc Drains none N. none Tourniquet none Disposition to PACU stable  Details patient was counseled in the holding area cracks hours signed and marked appropriately IV was started block administered. On the where the operating room IV antibiotics were given. In the operating room placed supine position under monitored anesthesia care Dr. Theodoro Kos 8 relatives elevated prepped DuraPrep draped into a sterile fashion and a thigh holder. Done and confirmed by all involved in the room. Scopic portals were established proximal medial inferomedial inferolateral. Diagnostic arthroscopy revealed severest synovitis and a synovectomy was done to do synovitis. Telephone drawer but large osteophytes medially it was dry and a moderate arthritic change at L4 joint chondroplasty was performed debridement synovectomy. Anterior cruciate ligament and posterior cruciate ligaments were intact. Medial compartment showed intact meniscus and mild chondromalacia  Lateral Tsao inspected extensive large complex tear of the lateral meniscus involving the entire anterior to 3 force. This was in addition to extensive grade 4 bone against bone chondromalacia arthritic change. As and baskets and motorized shaver partial lateral meniscectomy was performed back to a stable base smoothed down ArthroCare system. An echo chondroplasty was performed on the periphery with shaver knee was sequentially reevaluated no other abnormalities  noted arthroscopic equipment was removed. 3 portals closed with 4-0 nylon suture. 10 cc of 0.25% Sensorcaine was placed into the portal sites and intra-articular. Sterile dressing was applied compressor wrap ice pack awakened tight operating room to PACU in stable condition. No complications or problems.

## 2011-06-11 NOTE — Transfer of Care (Signed)
Immediate Anesthesia Transfer of Care Note  Patient: Vicki Krueger  Procedure(s) Performed: Procedure(s) (LRB): ARTHROSCOPY KNEE ()  Patient Location: PACU  Anesthesia Type: MAC combined with regional for post-op pain  Level of Consciousness: awake, alert  and oriented  Airway & Oxygen Therapy: Patient Spontanous Breathing  Post-op Assessment: Report given to PACU RN and Post -op Vital signs reviewed and stable  Post vital signs: Reviewed and stable  Complications: No apparent anesthesia complications

## 2011-06-11 NOTE — H&P (Signed)
Vicki Krueger is an 61 y.o. female.   Chief Complaint: Right knee pain HPI:61 yoa female with right knee pain after a work injury. MRI shows lateral meniscus tear and OA  Past Medical History  Diagnosis Date  . Hypertension   . GERD (gastroesophageal reflux disease)   . H/O hiatal hernia   . Arthritis   . Acute meniscal tear of knee RIGHT  . Swelling of right knee joint   . Left knee pain   . Cataract immature   . H/O cardiac murmur   . History of hepatitis C 2006  PER BLOOD TEST -- TX FOR 1 YR--  NO SYMPTOMS SINCE--  POSS. DUE TO NEEDLE STICK  (PT A NURSE)  . History of meningitis X4  LAST HAD IN 2007    Past Surgical History  Procedure Date  . Dilation and curettage of uterus   . Dx laparoscopy GYN  . Cesarean section 1989  . Tubal ligation   . Left knee arthroscopy X2  LAST ONE 2000  . Laparoscopic cholecystectomy MAY 2004  . Hysteroscopy w/d&c APRIL 2004  . Tonsillectomy and adenoidectomy CHILD    History reviewed. No pertinent family history. Social History:  reports that she has never smoked. She has never used smokeless tobacco. She reports that she does not drink alcohol or use illicit drugs.  Allergies:  Allergies  Allergen Reactions  . Aspirin Other (See Comments)    GI UPSET  . Pseudoephedrine Other (See Comments)    "HYPER"  . Doxycycline Rash  . Morphine And Related Rash    Medications Prior to Admission  Medication Dose Route Frequency Provider Last Rate Last Dose  . 0.9 %  sodium chloride infusion   Intravenous Continuous Vicki Rushing, PA      . ceFAZolin (ANCEF) IVPB 2 g/50 mL premix  2 g Intravenous 60 min Pre-Op Vicki Rushing, PA      . fentaNYL (SUBLIMAZE) injection 100 mcg  100 mcg Intravenous Once Vicki Der, MD   100 mcg at 06/11/11 1333  . lactated ringers infusion   Intravenous Continuous Vicki Grout, MD 50 mL/hr at 06/11/11 1254    . midazolam (VERSED) injection 2 mg  2 mg Intravenous Once Vicki Der, MD   2 mg at 06/11/11  1333  . povidone-iodine (BETADINE) 7.5 % scrub   Topical Once Vicki Rushing, PA       Medications Prior to Admission  Medication Sig Dispense Refill  . omeprazole (PRILOSEC) 20 MG capsule Take 20 mg by mouth 2 (two) times daily.      Marland Kitchen spironolactone (ALDACTONE) 25 MG tablet Take 25 mg by mouth 2 (two) times daily.      Marland Kitchen topiramate (TOPAMAX) 50 MG tablet Take 50 mg by mouth 2 (two) times daily.      . traMADol (ULTRAM) 50 MG tablet Take 50 mg by mouth every 6 (six) hours as needed.      . valsartan-hydrochlorothiazide (DIOVAN-HCT) 160-25 MG per tablet Take 1 tablet by mouth daily.      . Multiple Vitamin (MULTIVITAMIN) tablet Take 1 tablet by mouth daily.        Results for orders placed during the hospital encounter of 06/11/11 (from the past 48 hour(s))  COMPREHENSIVE METABOLIC PANEL     Status: Abnormal   Collection Time   06/11/11 12:51 PM      Component Value Range Comment   Sodium 138  135 - 145 (mEq/L)  Potassium 4.3  3.5 - 5.1 (mEq/L) SLIGHT HEMOLYSIS   Chloride 103  96 - 112 (mEq/L)    CO2 25  19 - 32 (mEq/L)    Glucose, Bld 92  70 - 99 (mg/dL)    BUN 15  6 - 23 (mg/dL)    Creatinine, Ser 4.09  0.50 - 1.10 (mg/dL)    Calcium 9.5  8.4 - 10.5 (mg/dL)    Total Protein 7.7  6.0 - 8.3 (g/dL)    Albumin 4.0  3.5 - 5.2 (g/dL)    AST 34  0 - 37 (U/L) SLIGHT HEMOLYSIS   ALT 14  0 - 35 (U/L) SLIGHT HEMOLYSIS   Alkaline Phosphatase 73  39 - 117 (U/L) SLIGHT HEMOLYSIS   Total Bilirubin 0.4  0.3 - 1.2 (mg/dL)    GFR calc non Af Amer 73 (*) >90 (mL/min)    GFR calc Af Amer 84 (*) >90 (mL/min)   POCT HEMOGLOBIN-HEMACUE     Status: Normal   Collection Time   06/11/11  1:01 PM      Component Value Range Comment   Hemoglobin 14.6  12.0 - 15.0 (g/dL)    No results found.  Review of Systems  Constitutional: Negative.   HENT: Negative.   Respiratory: Negative.   Cardiovascular: Negative.   Genitourinary: Negative.   Musculoskeletal: Positive for joint pain.  Neurological:  Negative.     Blood pressure 118/72, pulse 83, temperature 97.3 F (36.3 C), temperature source Oral, resp. rate 16, height 4\' 11"  (1.499 m), weight 78.926 kg (174 lb), SpO2 100.00%. Physical Exam cons alert health appearing female no distress, neck supple,heart reg rate, lungs clear and equal,abd soft nontender, +BS, extremities NMVI  Assessment/Plan Right knee pain with internal derangement per MRI. Plan Rt knee scope with debridement of lateral meniscus tear and OA  Vicki Krueger W 06/11/2011, 2:33 PM

## 2011-06-11 NOTE — Anesthesia Preprocedure Evaluation (Addendum)
Anesthesia Evaluation  Patient identified by MRN, date of birth, ID band Patient awake    Reviewed: Allergy & Precautions, H&P , NPO status , Patient's Chart, lab work & pertinent test results  Airway Mallampati: II TM Distance: >3 FB Neck ROM: Full    Dental No notable dental hx.    Pulmonary neg pulmonary ROS,  clear to auscultation  Pulmonary exam normal       Cardiovascular hypertension, Pt. on medications Regular Normal    Neuro/Psych Negative Neurological ROS  Negative Psych ROS   GI/Hepatic Neg liver ROS, hiatal hernia, GERD-  Medicated,  Endo/Other  Negative Endocrine ROS  Renal/GU negative Renal ROS  Genitourinary negative   Musculoskeletal negative musculoskeletal ROS (+)   Abdominal (+) obese,   Peds negative pediatric ROS (+)  Hematology negative hematology ROS (+)   Anesthesia Other Findings   Reproductive/Obstetrics negative OB ROS                           Anesthesia Physical Anesthesia Plan  ASA: II  Anesthesia Plan: MAC   Post-op Pain Management: MAC Combined w/ Regional for Post-op pain   Induction: Intravenous  Airway Management Planned: Simple Face Mask  Additional Equipment:   Intra-op Plan:   Post-operative Plan: Extubation in OR  Informed Consent: I have reviewed the patients History and Physical, chart, labs and discussed the procedure including the risks, benefits and alternatives for the proposed anesthesia with the patient or authorized representative who has indicated his/her understanding and acceptance.   Dental advisory given  Plan Discussed with: CRNA  Anesthesia Plan Comments: (Discussed intraarticular knee block with patient. Knee block is per surgeon's request. Questions answered. Consent given. )       Anesthesia Quick Evaluation

## 2011-06-11 NOTE — Anesthesia Procedure Notes (Signed)
Anesthesia Regional Block:    Pre-Anesthetic Checklist: ,, timeout performed, Correct Patient, Correct Site, Correct Laterality, Correct Procedure, Correct Position, site marked, Risks and benefits discussed,  Surgical consent,  Pre-op evaluation,  At surgeon's request and post-op pain management  Laterality: Right  Prep: Betadine       Needles:  Injection technique: Single-shot      Needle Gauge: 18 and 18 G    Additional Needles:  Narrative:   Performed by: Personally  Anesthesiologist: denenny  Additional Notes: Right knee intraarticular block per surgeon request.

## 2011-06-11 NOTE — H&P (Signed)
Vicki Krueger is an 61 y.o. female.   Chief Complaint: Right knee pain HPI:61 yoa female with right knee pain after a work injury. MRI shows lateral meniscus tear and OA  Past Medical History  Diagnosis Date  . Hypertension   . GERD (gastroesophageal reflux disease)   . H/O hiatal hernia   . Arthritis   . Acute meniscal tear of knee RIGHT  . Swelling of right knee joint   . Left knee pain   . Cataract immature   . H/O cardiac murmur   . History of hepatitis C 2006  PER BLOOD TEST -- TX FOR 1 YR--  NO SYMPTOMS SINCE--  POSS. DUE TO NEEDLE STICK  (PT A NURSE)  . History of meningitis X4  LAST HAD IN 2007    Past Surgical History  Procedure Date  . Dilation and curettage of uterus   . Dx laparoscopy GYN  . Cesarean section 1989  . Tubal ligation   . Left knee arthroscopy X2  LAST ONE 2000  . Laparoscopic cholecystectomy MAY 2004  . Hysteroscopy w/d&c APRIL 2004  . Tonsillectomy and adenoidectomy CHILD    History reviewed. No pertinent family history. Social History:  reports that she has never smoked. She has never used smokeless tobacco. She reports that she does not drink alcohol or use illicit drugs.  Allergies:  Allergies  Allergen Reactions  . Aspirin Other (See Comments)    GI UPSET  . Pseudoephedrine Other (See Comments)    "HYPER"  . Doxycycline Rash  . Morphine And Related Rash    Medications Prior to Admission  Medication Dose Route Frequency Provider Last Rate Last Dose  . 0.9 %  sodium chloride infusion   Intravenous Continuous Jamelle Rushing, PA      . ceFAZolin (ANCEF) IVPB 2 g/50 mL premix  2 g Intravenous 60 min Pre-Op Jamelle Rushing, PA      . fentaNYL (SUBLIMAZE) injection 100 mcg  100 mcg Intravenous Once Azell Der, MD   100 mcg at 06/11/11 1333  . lactated ringers infusion   Intravenous Continuous Phillips Grout, MD 50 mL/hr at 06/11/11 1254    . midazolam (VERSED) injection 2 mg  2 mg Intravenous Once Azell Der, MD   2 mg at 06/11/11  1333  . povidone-iodine (BETADINE) 7.5 % scrub   Topical Once Jamelle Rushing, PA       Medications Prior to Admission  Medication Sig Dispense Refill  . omeprazole (PRILOSEC) 20 MG capsule Take 20 mg by mouth 2 (two) times daily.      Marland Kitchen spironolactone (ALDACTONE) 25 MG tablet Take 25 mg by mouth 2 (two) times daily.      Marland Kitchen topiramate (TOPAMAX) 50 MG tablet Take 50 mg by mouth 2 (two) times daily.      . traMADol (ULTRAM) 50 MG tablet Take 50 mg by mouth every 6 (six) hours as needed.      . valsartan-hydrochlorothiazide (DIOVAN-HCT) 160-25 MG per tablet Take 1 tablet by mouth daily.      . Multiple Vitamin (MULTIVITAMIN) tablet Take 1 tablet by mouth daily.        Results for orders placed during the hospital encounter of 06/11/11 (from the past 48 hour(s))  COMPREHENSIVE METABOLIC PANEL     Status: Abnormal   Collection Time   06/11/11 12:51 PM      Component Value Range Comment   Sodium 138  135 - 145 (mEq/L)  Potassium 4.3  3.5 - 5.1 (mEq/L) SLIGHT HEMOLYSIS   Chloride 103  96 - 112 (mEq/L)    CO2 25  19 - 32 (mEq/L)    Glucose, Bld 92  70 - 99 (mg/dL)    BUN 15  6 - 23 (mg/dL)    Creatinine, Ser 4.09  0.50 - 1.10 (mg/dL)    Calcium 9.5  8.4 - 10.5 (mg/dL)    Total Protein 7.7  6.0 - 8.3 (g/dL)    Albumin 4.0  3.5 - 5.2 (g/dL)    AST 34  0 - 37 (U/L) SLIGHT HEMOLYSIS   ALT 14  0 - 35 (U/L) SLIGHT HEMOLYSIS   Alkaline Phosphatase 73  39 - 117 (U/L) SLIGHT HEMOLYSIS   Total Bilirubin 0.4  0.3 - 1.2 (mg/dL)    GFR calc non Af Amer 73 (*) >90 (mL/min)    GFR calc Af Amer 84 (*) >90 (mL/min)   POCT HEMOGLOBIN-HEMACUE     Status: Normal   Collection Time   06/11/11  1:01 PM      Component Value Range Comment   Hemoglobin 14.6  12.0 - 15.0 (g/dL)    No results found.  Review of Systems  Constitutional: Negative.   HENT: Negative.   Respiratory: Negative.   Cardiovascular: Negative.   Genitourinary: Negative.   Musculoskeletal: Positive for joint pain.  Neurological:  Negative.     Blood pressure 118/72, pulse 83, temperature 97.3 F (36.3 C), temperature source Oral, resp. rate 16, height 4\' 11"  (1.499 m), weight 78.926 kg (174 lb), SpO2 100.00%. Physical Exam  cons alert health appearing female no distress, neck supple,heart reg rate, lungs clear and equal,abd soft nontender, +BS, extremities NMVI  Assessment/Plan Right knee pain with internal derangement per MRI. Plan Rt knee scope with debridement of lateral meniscus tear and OA  Alohilani Levenhagen ANDREW 06/11/2011, 2:48 PM

## 2011-06-11 NOTE — Anesthesia Postprocedure Evaluation (Signed)
  Anesthesia Post-op Note  Patient: Vicki Krueger  Procedure(s) Performed: Procedure(s) (LRB): ARTHROSCOPY KNEE ()  Patient Location: PACU  Anesthesia Type: MAC  Level of Consciousness: awake and alert   Airway and Oxygen Therapy: Patient Spontanous Breathing  Post-op Pain: mild  Post-op Assessment: Post-op Vital signs reviewed, Patient's Cardiovascular Status Stable, Respiratory Function Stable, Patent Airway and No signs of Nausea or vomiting  Post-op Vital Signs: stable  Complications: No apparent anesthesia complications

## 2011-06-12 ENCOUNTER — Encounter (HOSPITAL_BASED_OUTPATIENT_CLINIC_OR_DEPARTMENT_OTHER): Payer: Self-pay | Admitting: Specialist

## 2011-06-12 MED ORDER — SODIUM CHLORIDE 0.9 % IR SOLN
Status: AC | PRN
  Administered 2011-06-12: 6000 mL

## 2011-06-24 ENCOUNTER — Ambulatory Visit: Payer: PRIVATE HEALTH INSURANCE | Attending: Family Medicine | Admitting: Physical Therapy

## 2011-06-24 DIAGNOSIS — M6281 Muscle weakness (generalized): Secondary | ICD-10-CM | POA: Insufficient documentation

## 2011-06-24 DIAGNOSIS — M25619 Stiffness of unspecified shoulder, not elsewhere classified: Secondary | ICD-10-CM | POA: Insufficient documentation

## 2011-06-24 DIAGNOSIS — IMO0001 Reserved for inherently not codable concepts without codable children: Secondary | ICD-10-CM | POA: Insufficient documentation

## 2011-06-24 DIAGNOSIS — M25519 Pain in unspecified shoulder: Secondary | ICD-10-CM | POA: Insufficient documentation

## 2011-06-25 ENCOUNTER — Ambulatory Visit: Payer: PRIVATE HEALTH INSURANCE | Admitting: Physical Therapy

## 2011-06-30 ENCOUNTER — Ambulatory Visit: Payer: PRIVATE HEALTH INSURANCE | Admitting: Physical Therapy

## 2011-07-02 ENCOUNTER — Ambulatory Visit: Payer: PRIVATE HEALTH INSURANCE | Admitting: Physical Therapy

## 2011-07-07 ENCOUNTER — Ambulatory Visit: Payer: PRIVATE HEALTH INSURANCE | Admitting: Physical Therapy

## 2011-07-09 ENCOUNTER — Ambulatory Visit: Payer: PRIVATE HEALTH INSURANCE | Admitting: Physical Therapy

## 2011-07-14 ENCOUNTER — Ambulatory Visit: Payer: PRIVATE HEALTH INSURANCE | Admitting: Physical Therapy

## 2011-07-16 ENCOUNTER — Ambulatory Visit: Payer: PRIVATE HEALTH INSURANCE | Admitting: Physical Therapy

## 2011-07-21 ENCOUNTER — Ambulatory Visit: Payer: PRIVATE HEALTH INSURANCE | Attending: Family Medicine | Admitting: Physical Therapy

## 2011-07-21 DIAGNOSIS — M25519 Pain in unspecified shoulder: Secondary | ICD-10-CM | POA: Insufficient documentation

## 2011-07-21 DIAGNOSIS — IMO0001 Reserved for inherently not codable concepts without codable children: Secondary | ICD-10-CM | POA: Insufficient documentation

## 2011-07-21 DIAGNOSIS — M6281 Muscle weakness (generalized): Secondary | ICD-10-CM | POA: Insufficient documentation

## 2011-07-21 DIAGNOSIS — M25619 Stiffness of unspecified shoulder, not elsewhere classified: Secondary | ICD-10-CM | POA: Insufficient documentation

## 2011-07-23 ENCOUNTER — Ambulatory Visit: Payer: PRIVATE HEALTH INSURANCE | Admitting: Physical Therapy

## 2011-07-29 ENCOUNTER — Ambulatory Visit: Payer: PRIVATE HEALTH INSURANCE | Admitting: Physical Therapy

## 2011-07-31 ENCOUNTER — Ambulatory Visit: Payer: PRIVATE HEALTH INSURANCE | Admitting: Physical Therapy

## 2011-08-03 ENCOUNTER — Ambulatory Visit: Payer: PRIVATE HEALTH INSURANCE | Admitting: Physical Therapy

## 2011-08-05 ENCOUNTER — Ambulatory Visit: Payer: PRIVATE HEALTH INSURANCE | Admitting: Physical Therapy

## 2011-08-11 ENCOUNTER — Ambulatory Visit: Payer: PRIVATE HEALTH INSURANCE | Admitting: Physical Therapy

## 2011-08-12 ENCOUNTER — Encounter: Payer: Self-pay | Admitting: Physical Therapy

## 2011-08-14 ENCOUNTER — Ambulatory Visit: Payer: PRIVATE HEALTH INSURANCE | Admitting: Physical Therapy

## 2011-08-18 ENCOUNTER — Ambulatory Visit: Payer: PRIVATE HEALTH INSURANCE | Admitting: Physical Therapy

## 2011-08-19 ENCOUNTER — Ambulatory Visit: Payer: PRIVATE HEALTH INSURANCE | Attending: Family Medicine | Admitting: Physical Therapy

## 2011-08-19 DIAGNOSIS — M25519 Pain in unspecified shoulder: Secondary | ICD-10-CM | POA: Insufficient documentation

## 2011-08-19 DIAGNOSIS — IMO0001 Reserved for inherently not codable concepts without codable children: Secondary | ICD-10-CM | POA: Insufficient documentation

## 2011-08-19 DIAGNOSIS — M6281 Muscle weakness (generalized): Secondary | ICD-10-CM | POA: Insufficient documentation

## 2011-08-19 DIAGNOSIS — M25619 Stiffness of unspecified shoulder, not elsewhere classified: Secondary | ICD-10-CM | POA: Insufficient documentation

## 2011-08-20 ENCOUNTER — Encounter: Payer: Self-pay | Admitting: Physical Therapy

## 2011-08-25 ENCOUNTER — Ambulatory Visit: Payer: PRIVATE HEALTH INSURANCE | Admitting: Physical Therapy

## 2011-08-27 ENCOUNTER — Ambulatory Visit: Payer: PRIVATE HEALTH INSURANCE | Admitting: Physical Therapy

## 2011-09-01 ENCOUNTER — Ambulatory Visit: Payer: PRIVATE HEALTH INSURANCE | Admitting: Physical Therapy

## 2012-06-29 ENCOUNTER — Encounter: Payer: Self-pay | Admitting: Internal Medicine

## 2012-07-27 NOTE — Progress Notes (Signed)
Need order in epic for 08-10-12 surgery, thanks

## 2012-07-28 ENCOUNTER — Other Ambulatory Visit: Payer: Self-pay | Admitting: Orthopedic Surgery

## 2012-07-28 ENCOUNTER — Encounter (HOSPITAL_COMMUNITY): Payer: Self-pay | Admitting: Pharmacy Technician

## 2012-07-28 ENCOUNTER — Inpatient Hospital Stay (HOSPITAL_COMMUNITY)
Admission: RE | Admit: 2012-07-28 | Discharge: 2012-07-28 | Disposition: A | Discharge status: Home / Self Care | Payer: Self-pay | Source: Ambulatory Visit

## 2012-07-28 ENCOUNTER — Encounter (HOSPITAL_COMMUNITY): Payer: Self-pay | Admitting: *Deleted

## 2012-07-28 MED ORDER — BUPIVACAINE LIPOSOME 1.3 % IJ SUSP: 20.0000 mL | Freq: Once | INTRAMUSCULAR | Status: DC

## 2012-07-28 MED ORDER — DEXAMETHASONE SODIUM PHOSPHATE 10 MG/ML IJ SOLN: 10.0000 mg | Freq: Once | INTRAMUSCULAR | Status: DC

## 2012-07-28 NOTE — Patient Instructions (Addendum)
22 Vicki Krueger  07/28/2012   Your procedure is scheduled on: 4-23  -2014  Report to [facility] at    0515    AM   Call this number if you have problems the morning of surgery: [phone]  Or Presurgical Testing [phone]([name])     Do not eat food:After Midnight.    Take these medicines the morning of surgery with A SIP OF WATER: Topamax. Ultram.(if needed)   Do not wear jewelry, make-up or nail polish.  Do not wear lotions, powders, or perfumes. You may wear deodorant.  Do not shave 12 hours prior to first CHG shower(legs and under arms).(face and neck okay.)  Do not bring valuables to the hospital.  Contacts, dentures or bridgework,body piercing,  may not be worn into surgery.  Leave suitcase in the car. After surgery it may be brought to your room.  For patients admitted to the hospital, checkout time is 11:00 AM the day of discharge.   Patients discharged the day of surgery will not be allowed to drive home. Must have responsible person with you x 24 hours once discharged.  Name and phone number of your driver: [name], spouse [phone]  Special Instructions: CHG(Chlorhedine 4%-"Hibiclens","Betasept","Aplicare") Shower Use Special Wash: see special instructions.(avoid face and genitals)   Please read over the following fact sheets that you were given: MRSA Information, Blood Transfusion fact sheet, Incentive Spirometry Instruction.    Failure to follow these instructions may result in Cancellation of your surgery.   Patient signature_______________________________________________________

## 2012-07-28 NOTE — Progress Notes (Signed)
Preoperative surgical orders have been place into the Epic hospital system for Vicki Krueger on 07/28/2012, 3:43 PM  by Patrica Duel for surgery on 08/10/2012.  Preop Total Knee orders including Experal, IV Tylenol, and IV Decadron as long as there are no contraindications to the above medications. Avel Peace, PA-C

## 2012-08-08 ENCOUNTER — Ambulatory Visit (HOSPITAL_COMMUNITY)
Admission: RE | Admit: 2012-08-08 | Discharge: 2012-08-08 | Disposition: A | Discharge status: Home / Self Care | Payer: PRIVATE HEALTH INSURANCE | Source: Ambulatory Visit | Attending: Orthopedic Surgery | Admitting: Orthopedic Surgery

## 2012-08-08 ENCOUNTER — Other Ambulatory Visit: Payer: Self-pay

## 2012-08-08 ENCOUNTER — Encounter (HOSPITAL_COMMUNITY)
Admission: RE | Admit: 2012-08-08 | Discharge: 2012-08-08 | Disposition: A | Discharge status: Home / Self Care | Payer: PRIVATE HEALTH INSURANCE | Source: Ambulatory Visit | Attending: Orthopedic Surgery | Admitting: Orthopedic Surgery

## 2012-08-08 ENCOUNTER — Other Ambulatory Visit: Payer: Self-pay | Admitting: Surgical

## 2012-08-08 DIAGNOSIS — M1710 Unilateral primary osteoarthritis, unspecified knee: Secondary | ICD-10-CM | POA: Insufficient documentation

## 2012-08-08 DIAGNOSIS — Z01810 Encounter for preprocedural cardiovascular examination: Secondary | ICD-10-CM | POA: Insufficient documentation

## 2012-08-08 DIAGNOSIS — Z01818 Encounter for other preprocedural examination: Secondary | ICD-10-CM | POA: Insufficient documentation

## 2012-08-08 DIAGNOSIS — I10 Essential (primary) hypertension: Secondary | ICD-10-CM | POA: Insufficient documentation

## 2012-08-08 LAB — URINALYSIS, ROUTINE W REFLEX MICROSCOPIC
Bilirubin Urine: NEGATIVE
Glucose, UA: NEGATIVE mg/dL
Hgb urine dipstick: NEGATIVE
Specific Gravity, Urine: 1.017 (ref 1.005–1.030)
Urobilinogen, UA: 1 mg/dL (ref 0.0–1.0)
pH: 7 (ref 5.0–8.0)

## 2012-08-08 LAB — COMPREHENSIVE METABOLIC PANEL
AST: 24 U/L (ref 0–37)
CO2: 25 mEq/L (ref 19–32)
Chloride: 101 mEq/L (ref 96–112)
Creatinine, Ser: 0.87 mg/dL (ref 0.50–1.10)
GFR calc non Af Amer: 71 mL/min — ABNORMAL LOW (ref >90)
Total Bilirubin: 0.2 mg/dL — ABNORMAL LOW (ref 0.3–1.2)

## 2012-08-08 LAB — CBC
HCT: 39.2 % (ref 36.0–46.0)
MCV: 88.9 fL (ref 78.0–100.0)
Platelets: 250 10*3/uL (ref 150–400)
RBC: 4.41 MIL/uL (ref 3.87–5.11)
WBC: 7.7 10*3/uL (ref 4.0–10.5)

## 2012-08-08 LAB — PROTIME-INR: INR: 1.05 (ref 0.00–1.49)

## 2012-08-08 LAB — ABO/RH: ABO/RH(D): O POS

## 2012-08-08 MED ORDER — TRANEXAMIC ACID 100 MG/ML IV SOLN: 1000.0000 mg | INTRAVENOUS | Status: DC

## 2012-08-08 NOTE — Progress Notes (Signed)
Spoke with wendy cayton dr Lequita Halt and [facility] pa are aware of pts tooth # 13 with root tip fracture and pt on antibiotics since 08-02-2012 for abcess, pt ok for surgery per dr Lequita Halt.

## 2012-08-08 NOTE — H&P (Signed)
TOTAL KNEE ADMISSION H&P  Patient is being admitted for right total knee arthroplasty.  Subjective:  Chief Complaint:right knee pain.  HPI: Vicki Krueger, 62 y.o. female, has a history of pain and functional disability in the right knee due to trauma (twisted the knee at work) and arthritis and has failed non-surgical conservative treatments for greater than 12 weeks to includeNSAID's and/or analgesics, corticosteriod injections, viscosupplementation injections and activity modification.  Onset of symptoms was gradual, starting 2 years ago with gradually worsening course since that time. The patient noted prior procedures on the knee to include  arthroscopy on the right knee(s).  Patient currently rates pain in the right knee(s) at 7 out of 10 with activity. Patient has night pain, worsening of pain with activity and weight bearing, pain that interferes with activities of daily living, pain with passive range of motion, crepitus and joint swelling.  Patient has evidence of periarticular osteophytes and joint space narrowing by imaging studies. There is no active infection.   Past Medical History  Diagnosis Date  . Hypertension   . GERD (gastroesophageal reflux disease)   . H/O hiatal hernia   . Arthritis   . Acute meniscal tear of knee RIGHT  . Swelling of right knee joint   . Left knee pain   . Cataract immature   . H/O cardiac murmur   . History of hepatitis C 2006  PER BLOOD TEST -- TX FOR 1 YR--  NO SYMPTOMS SINCE--  POSS. DUE TO NEEDLE STICK  (PT A NURSE)  . History of meningitis X4  LAST HAD IN 2007  . Heart murmur   . Hepatitis 2006    treatment x 1 year no problem now    Past Surgical History  Procedure Laterality Date  . Dilation and curettage of uterus    . Dx laparoscopy  GYN  . Cesarean section  1989  . Tubal ligation    . Left knee arthroscopy  X2  LAST ONE 2000  . Laparoscopic cholecystectomy  MAY 2004  . Hysteroscopy w/d&c  APRIL 2004  . Knee arthroscopy   06/11/2011    Procedure: ARTHROSCOPY KNEE;  Surgeon: Erasmo Leventhal, MD;  Location: [facility];  Service: Orthopedics;;  with debridement  Local with MAC  . Tonsillectomy and adenoidectomy  CHILD     Current outpatient prescriptions: calcium carbonate (OS-CAL) 600 MG TABS, Take 600 mg by mouth 2 (two) times daily with a meal., Disp: , Rfl: ;  cholecalciferol (VITAMIN D) 400 UNITS TABS, Take 400 Units by mouth daily., Disp: , Rfl: ;  loratadine (CLARITIN) 10 MG tablet, Take 10 mg by mouth daily., Disp: , Rfl: ;  Multiple Vitamin (MULTIVITAMIN) tablet, Take 1 tablet by mouth daily., Disp: , Rfl:  omeprazole (PRILOSEC) 20 MG capsule, Take 20 mg by mouth daily. , Disp: , Rfl: ;  spironolactone (ALDACTONE) 25 MG tablet, Take 50 mg by mouth every morning. , Disp: , Rfl: ;  topiramate (TOPAMAX) 50 MG tablet, Take 50 mg by mouth 2 (two) times daily., Disp: , Rfl: ;  traMADol (ULTRAM) 50 MG tablet, Take 50 mg by mouth every 6 (six) hours as needed for pain., Disp: , Rfl:  valsartan-hydrochlorothiazide (DIOVAN-HCT) 160-25 MG per tablet, Take 1 tablet by mouth every morning. , Disp: , Rfl:    Allergies  Allergen Reactions  . Aspirin Other (See Comments)    GI UPSET  . Other Other (See Comments)    Actifed - hyper  . Pseudoephedrine  Other (See Comments)    "HYPER"  . Doxycycline Rash  . Morphine And Related Rash    History  Substance Use Topics  . Smoking status: Never Smoker   . Smokeless tobacco: Never Used  . Alcohol Use: No    History reviewed. No pertinent family history.   Review of Systems  Constitutional: Negative.   HENT: Negative for hearing loss, ear pain, nosebleeds, congestion, sore throat, neck pain, tinnitus and ear discharge.   Eyes: Negative.   Respiratory: Negative.  Negative for stridor.   Cardiovascular: Negative.   Gastrointestinal: Positive for heartburn and abdominal pain. Negative for nausea, vomiting, diarrhea, constipation, blood in stool and  melena.  Genitourinary: Negative.   Musculoskeletal: Positive for joint pain. Negative for myalgias, back pain and falls.       Right knee pain  Skin: Negative.   Neurological: Positive for headaches. Negative for tingling, tremors, sensory change, speech change, focal weakness, seizures and loss of consciousness.  Endo/Heme/Allergies: Negative.   Psychiatric/Behavioral: Negative.     Objective:  Physical Exam  Constitutional: She is oriented to person, place, and time. She appears well-developed and well-nourished. No distress.  HENT:  Head: Normocephalic and atraumatic.  Right Ear: External ear normal.  Left Ear: External ear normal.  Nose: Nose normal.  Mouth/Throat: Oropharynx is clear and moist.  Eyes: Conjunctivae and EOM are normal.  Neck: Normal range of motion. Neck supple. No tracheal deviation present. No thyromegaly present.  Cardiovascular: Normal rate, regular rhythm and intact distal pulses.   Murmur heard. Respiratory: Effort normal and breath sounds normal. No respiratory distress. She has no wheezes. She exhibits no tenderness.  GI: She exhibits no distension and no mass. There is no tenderness.  Musculoskeletal:       Right hip: Normal.       Left hip: Normal.       Right knee: She exhibits decreased range of motion, effusion and abnormal alignment. Tenderness found. Medial joint line and lateral joint line tenderness noted.       Left knee: Normal.       Right lower leg: She exhibits no tenderness and no swelling.       Left lower leg: She exhibits no tenderness and no swelling.  She has a valgus malalignment deformity noted. Range of motion lacking 5 degrees of extension with flexion back to 115 degrees of flexion. She has about a 12-14 degree malalignment. She does have a slight effusion on the knee. There is moderate crepitus noted. She is extremely tender to palpation over her lateral joint line. There is some tenderness over the medial joint line.   Lymphadenopathy:    She has no cervical adenopathy.  Neurological: She is alert and oriented to person, place, and time. She has normal strength and normal reflexes. No sensory deficit.  Skin: No rash noted. She is not diaphoretic. No erythema.  Psychiatric: She has a normal mood and affect. Her behavior is normal.    Vitals Pulse: 72 (Regular) BP: 118/76 (Sitting, Left Arm, Standard)  Estimated body mass index is 35.13 kg/(m^2) as calculated from the following:   Height as of 06/09/11: 4\' 11"  (1.499 m).   Weight as of 06/11/11: 78.926 kg (174 lb).   Imaging Review Plain radiographs demonstrate severe degenerative joint disease of the right knee(s). The overall alignment issignificant valgus. The bone quality appears to be good for age and reported activity level.  Assessment/Plan:  End stage arthritis, right knee   The patient history, physical  examination, clinical judgment of the provider and imaging studies are consistent with end stage degenerative joint disease of the right knee(s) and total knee arthroplasty is deemed medically necessary. The treatment options including medical management, injection therapy arthroscopy and arthroplasty were discussed at length. The risks and benefits of total knee arthroplasty were presented and reviewed. The risks due to aseptic loosening, infection, stiffness, patella tracking problems, thromboembolic complications and other imponderables were discussed. The patient acknowledged the explanation, agreed to proceed with the plan and consent was signed. Patient is being admitted for inpatient treatment for surgery, pain control, PT, OT, prophylactic antibiotics, VTE prophylaxis, progressive ambulation and ADL's and discharge planning. The patient is planning to be discharged home with home health services      [place], [place]

## 2012-08-09 ENCOUNTER — Encounter (HOSPITAL_COMMUNITY)
Admission: RE | Admit: 2012-08-09 | Discharge: 2012-08-09 | Disposition: A | Discharge status: Home / Self Care | Payer: PRIVATE HEALTH INSURANCE | Source: Ambulatory Visit | Attending: Orthopedic Surgery | Admitting: Orthopedic Surgery

## 2012-08-09 LAB — SURGICAL PCR SCREEN
MRSA, PCR: NEGATIVE
Staphylococcus aureus: NEGATIVE

## 2012-08-09 NOTE — Progress Notes (Signed)
cmet results routed by fax to dr Lequita Halt by epic

## 2012-08-10 ENCOUNTER — Ambulatory Visit (HOSPITAL_COMMUNITY): Payer: PRIVATE HEALTH INSURANCE | Admitting: Certified Registered Nurse Anesthetist

## 2012-08-10 ENCOUNTER — Encounter (HOSPITAL_COMMUNITY): Payer: Self-pay | Admitting: *Deleted

## 2012-08-10 ENCOUNTER — Encounter (HOSPITAL_COMMUNITY): Payer: Self-pay | Admitting: Certified Registered Nurse Anesthetist

## 2012-08-10 ENCOUNTER — Inpatient Hospital Stay (HOSPITAL_COMMUNITY)
Admission: RE | Admit: 2012-08-10 | Discharge: 2012-08-12 | DRG: 470 | Disposition: A | Discharge status: Home Health | Payer: PRIVATE HEALTH INSURANCE | Source: Ambulatory Visit | Attending: Orthopedic Surgery | Admitting: Orthopedic Surgery

## 2012-08-10 ENCOUNTER — Encounter (HOSPITAL_COMMUNITY)
Admission: RE | Disposition: A | Discharge status: Home Health | Payer: Self-pay | Source: Ambulatory Visit | Attending: Orthopedic Surgery

## 2012-08-10 DIAGNOSIS — Z8619 Personal history of other infectious and parasitic diseases: Secondary | ICD-10-CM

## 2012-08-10 DIAGNOSIS — M1710 Unilateral primary osteoarthritis, unspecified knee: Principal | ICD-10-CM | POA: Diagnosis present

## 2012-08-10 DIAGNOSIS — I10 Essential (primary) hypertension: Secondary | ICD-10-CM | POA: Diagnosis present

## 2012-08-10 DIAGNOSIS — K219 Gastro-esophageal reflux disease without esophagitis: Secondary | ICD-10-CM | POA: Diagnosis present

## 2012-08-10 HISTORY — PX: TOTAL KNEE ARTHROPLASTY: SHX125

## 2012-08-10 HISTORY — DX: Cardiac murmur, unspecified: R01.1

## 2012-08-10 HISTORY — DX: Inflammatory liver disease, unspecified: K75.9

## 2012-08-10 LAB — TYPE AND SCREEN: Antibody Screen: NEGATIVE

## 2012-08-10 SURGERY — ARTHROPLASTY, KNEE, TOTAL
Anesthesia: Spinal | Site: Knee | Laterality: Right | Wound class: Clean

## 2012-08-10 MED ORDER — FLEET ENEMA 7-19 GM/118ML RE ENEM: 1.0000 | ENEMA | Freq: Once | RECTAL | Status: AC | PRN

## 2012-08-10 MED ORDER — LIDOCAINE HCL (CARDIAC) 20 MG/ML IV SOLN
INTRAVENOUS | Status: DC | PRN
  Administered 2012-08-10: 100 mg via INTRAVENOUS

## 2012-08-10 MED ORDER — ONDANSETRON HCL 4 MG/2ML IJ SOLN: 4.0000 mg | Freq: Four times a day (QID) | INTRAMUSCULAR | Status: DC | PRN

## 2012-08-10 MED ORDER — SODIUM CHLORIDE 0.9 % IV SOLN: INTRAVENOUS | Status: DC

## 2012-08-10 MED ORDER — FENTANYL CITRATE 0.05 MG/ML IJ SOLN
25.0000 ug | INTRAMUSCULAR | Status: DC | PRN
  Administered 2012-08-10 (×2): 25 ug via INTRAVENOUS

## 2012-08-10 MED ORDER — VALSARTAN-HYDROCHLOROTHIAZIDE 160-25 MG PO TABS: 1.0000 | ORAL_TABLET | Freq: Every morning | ORAL | Status: DC

## 2012-08-10 MED ORDER — BUPIVACAINE HCL (PF) 0.75 % IJ SOLN
INTRAMUSCULAR | Status: DC | PRN
  Administered 2012-08-10: 1.8 mL via INTRATHECAL
  Administered 2012-08-10: 1 mL

## 2012-08-10 MED ORDER — ACETAMINOPHEN 10 MG/ML IV SOLN
INTRAVENOUS | Status: DC | PRN
  Administered 2012-08-10: 1000 mg via INTRAVENOUS

## 2012-08-10 MED ORDER — ACETAMINOPHEN 650 MG RE SUPP: 650.0000 mg | Freq: Four times a day (QID) | RECTAL | Status: DC | PRN

## 2012-08-10 MED ORDER — PHENYLEPHRINE HCL 10 MG/ML IJ SOLN
INTRAMUSCULAR | Status: DC | PRN
  Administered 2012-08-10 (×2): 80 ug via INTRAVENOUS
  Administered 2012-08-10: 40 ug via INTRAVENOUS
  Administered 2012-08-10: 80 ug via INTRAVENOUS

## 2012-08-10 MED ORDER — STERILE WATER FOR IRRIGATION IR SOLN
Status: DC | PRN
  Administered 2012-08-10: 3000 mL

## 2012-08-10 MED ORDER — FENTANYL CITRATE 0.05 MG/ML IJ SOLN
INTRAMUSCULAR | Status: AC
  Filled 2012-08-10: qty 2

## 2012-08-10 MED ORDER — DEXAMETHASONE 6 MG PO TABS
10.0000 mg | ORAL_TABLET | Freq: Every day | ORAL | Status: AC
  Administered 2012-08-11: 10 mg via ORAL
  Filled 2012-08-10: qty 1

## 2012-08-10 MED ORDER — ACETAMINOPHEN 10 MG/ML IV SOLN
INTRAVENOUS | Status: AC
  Filled 2012-08-10: qty 100

## 2012-08-10 MED ORDER — TRAMADOL HCL 50 MG PO TABS
50.0000 mg | ORAL_TABLET | Freq: Four times a day (QID) | ORAL | Status: DC | PRN
  Administered 2012-08-10: 100 mg via ORAL
  Filled 2012-08-10: qty 2

## 2012-08-10 MED ORDER — TOPIRAMATE 25 MG PO TABS
50.0000 mg | ORAL_TABLET | Freq: Two times a day (BID) | ORAL | Status: DC
  Administered 2012-08-10 – 2012-08-12 (×4): 50 mg via ORAL
  Filled 2012-08-10 (×5): qty 2

## 2012-08-10 MED ORDER — KETOROLAC TROMETHAMINE 30 MG/ML IJ SOLN: 15.0000 mg | Freq: Once | INTRAMUSCULAR | Status: DC | PRN

## 2012-08-10 MED ORDER — ONDANSETRON HCL 4 MG PO TABS: 4.0000 mg | ORAL_TABLET | Freq: Four times a day (QID) | ORAL | Status: DC | PRN

## 2012-08-10 MED ORDER — PROPOFOL 10 MG/ML IV EMUL
INTRAVENOUS | Status: DC | PRN
  Administered 2012-08-10: 120 ug/kg/min via INTRAVENOUS

## 2012-08-10 MED ORDER — ACETAMINOPHEN 10 MG/ML IV SOLN
1000.0000 mg | Freq: Four times a day (QID) | INTRAVENOUS | Status: AC
  Administered 2012-08-10 – 2012-08-11 (×4): 1000 mg via INTRAVENOUS
  Filled 2012-08-10 (×6): qty 100

## 2012-08-10 MED ORDER — LACTATED RINGERS IV SOLN
INTRAVENOUS | Status: DC | PRN
  Administered 2012-08-10: 07:00:00 via INTRAVENOUS

## 2012-08-10 MED ORDER — SPIRONOLACTONE 50 MG PO TABS
50.0000 mg | ORAL_TABLET | Freq: Every morning | ORAL | Status: DC
  Filled 2012-08-10 (×2): qty 1

## 2012-08-10 MED ORDER — IRBESARTAN 150 MG PO TABS
150.0000 mg | ORAL_TABLET | Freq: Every day | ORAL | Status: DC
  Filled 2012-08-10 (×2): qty 1

## 2012-08-10 MED ORDER — BUPIVACAINE LIPOSOME 1.3 % IJ SUSP
20.0000 mL | Freq: Once | INTRAMUSCULAR | Status: DC
  Filled 2012-08-10: qty 20

## 2012-08-10 MED ORDER — METHOCARBAMOL 100 MG/ML IJ SOLN: 500.0000 mg | Freq: Four times a day (QID) | INTRAVENOUS | Status: DC | PRN

## 2012-08-10 MED ORDER — METOCLOPRAMIDE HCL 5 MG/ML IJ SOLN: 5.0000 mg | Freq: Three times a day (TID) | INTRAMUSCULAR | Status: DC | PRN

## 2012-08-10 MED ORDER — PROMETHAZINE HCL 25 MG/ML IJ SOLN: 6.2500 mg | INTRAMUSCULAR | Status: DC | PRN

## 2012-08-10 MED ORDER — LORATADINE 10 MG PO TABS
10.0000 mg | ORAL_TABLET | Freq: Every day | ORAL | Status: DC
  Administered 2012-08-10 – 2012-08-12 (×3): 10 mg via ORAL
  Filled 2012-08-10 (×3): qty 1

## 2012-08-10 MED ORDER — POLYETHYLENE GLYCOL 3350 17 G PO PACK
17.0000 g | PACK | Freq: Every day | ORAL | Status: DC | PRN
  Administered 2012-08-11: 17 g via ORAL

## 2012-08-10 MED ORDER — HYDROMORPHONE HCL PF 1 MG/ML IJ SOLN
INTRAMUSCULAR | Status: AC
  Filled 2012-08-10: qty 1

## 2012-08-10 MED ORDER — ACETAMINOPHEN 325 MG PO TABS: 650.0000 mg | ORAL_TABLET | Freq: Four times a day (QID) | ORAL | Status: DC | PRN

## 2012-08-10 MED ORDER — SODIUM CHLORIDE 0.9 % IJ SOLN
INTRAMUSCULAR | Status: DC | PRN
  Administered 2012-08-10: 08:00:00

## 2012-08-10 MED ORDER — CEFAZOLIN SODIUM-DEXTROSE 2-3 GM-% IV SOLR
INTRAVENOUS | Status: AC
  Filled 2012-08-10: qty 50

## 2012-08-10 MED ORDER — TRANEXAMIC ACID 100 MG/ML IV SOLN
1000.0000 mg | INTRAVENOUS | Status: AC
  Administered 2012-08-10: 1000 mg via INTRAVENOUS
  Filled 2012-08-10: qty 10

## 2012-08-10 MED ORDER — CEFAZOLIN SODIUM-DEXTROSE 2-3 GM-% IV SOLR
2.0000 g | Freq: Four times a day (QID) | INTRAVENOUS | Status: AC
  Administered 2012-08-10 (×2): 2 g via INTRAVENOUS
  Filled 2012-08-10 (×2): qty 50

## 2012-08-10 MED ORDER — HYDROCHLOROTHIAZIDE 25 MG PO TABS
25.0000 mg | ORAL_TABLET | Freq: Every day | ORAL | Status: DC
  Filled 2012-08-10 (×2): qty 1

## 2012-08-10 MED ORDER — PHENOL 1.4 % MT LIQD: 1.0000 | OROMUCOSAL | Status: DC | PRN

## 2012-08-10 MED ORDER — METOCLOPRAMIDE HCL 10 MG PO TABS: 5.0000 mg | ORAL_TABLET | Freq: Three times a day (TID) | ORAL | Status: DC | PRN

## 2012-08-10 MED ORDER — CEFAZOLIN SODIUM-DEXTROSE 2-3 GM-% IV SOLR
2.0000 g | INTRAVENOUS | Status: AC
  Administered 2012-08-10: 2 g via INTRAVENOUS

## 2012-08-10 MED ORDER — OXYCODONE HCL 5 MG PO TABS
5.0000 mg | ORAL_TABLET | ORAL | Status: DC | PRN
  Administered 2012-08-10: 10 mg via ORAL
  Administered 2012-08-10 (×2): 5 mg via ORAL
  Administered 2012-08-11 – 2012-08-12 (×11): 10 mg via ORAL
  Filled 2012-08-10 (×13): qty 2

## 2012-08-10 MED ORDER — HYDROMORPHONE HCL PF 1 MG/ML IJ SOLN
0.5000 mg | INTRAMUSCULAR | Status: DC | PRN
  Administered 2012-08-10 (×2): 1 mg via INTRAVENOUS
  Filled 2012-08-10 (×2): qty 1

## 2012-08-10 MED ORDER — DEXAMETHASONE SODIUM PHOSPHATE 10 MG/ML IJ SOLN
INTRAMUSCULAR | Status: DC | PRN
  Administered 2012-08-10: 10 mg via INTRAVENOUS

## 2012-08-10 MED ORDER — MENTHOL 3 MG MT LOZG: 1.0000 | LOZENGE | OROMUCOSAL | Status: DC | PRN

## 2012-08-10 MED ORDER — BISACODYL 10 MG RE SUPP: 10.0000 mg | Freq: Every day | RECTAL | Status: DC | PRN

## 2012-08-10 MED ORDER — DEXAMETHASONE SODIUM PHOSPHATE 10 MG/ML IJ SOLN: 10.0000 mg | Freq: Every day | INTRAMUSCULAR | Status: AC

## 2012-08-10 MED ORDER — DOCUSATE SODIUM 100 MG PO CAPS
100.0000 mg | ORAL_CAPSULE | Freq: Two times a day (BID) | ORAL | Status: DC
  Administered 2012-08-10 – 2012-08-12 (×4): 100 mg via ORAL

## 2012-08-10 MED ORDER — ONDANSETRON HCL 4 MG/2ML IJ SOLN
INTRAMUSCULAR | Status: DC | PRN
  Administered 2012-08-10: 4 mg via INTRAVENOUS

## 2012-08-10 MED ORDER — 0.9 % SODIUM CHLORIDE (POUR BTL) OPTIME
TOPICAL | Status: DC | PRN
  Administered 2012-08-10: 1000 mL

## 2012-08-10 MED ORDER — METHOCARBAMOL 500 MG PO TABS
500.0000 mg | ORAL_TABLET | Freq: Four times a day (QID) | ORAL | Status: DC | PRN
  Administered 2012-08-10 – 2012-08-12 (×5): 500 mg via ORAL
  Filled 2012-08-10 (×5): qty 1

## 2012-08-10 MED ORDER — RIVAROXABAN 10 MG PO TABS
10.0000 mg | ORAL_TABLET | Freq: Every day | ORAL | Status: DC
  Administered 2012-08-11 – 2012-08-12 (×2): 10 mg via ORAL
  Filled 2012-08-10 (×3): qty 1

## 2012-08-10 MED ORDER — DIPHENHYDRAMINE HCL 12.5 MG/5ML PO ELIX: 12.5000 mg | ORAL_SOLUTION | ORAL | Status: DC | PRN

## 2012-08-10 MED ORDER — PANTOPRAZOLE SODIUM 40 MG PO TBEC
40.0000 mg | DELAYED_RELEASE_TABLET | Freq: Every day | ORAL | Status: DC
  Administered 2012-08-10 – 2012-08-12 (×3): 40 mg via ORAL
  Filled 2012-08-10 (×3): qty 1

## 2012-08-10 MED ORDER — MIDAZOLAM HCL 5 MG/5ML IJ SOLN
INTRAMUSCULAR | Status: DC | PRN
  Administered 2012-08-10: 2 mg via INTRAVENOUS

## 2012-08-10 MED ORDER — ACETAMINOPHEN 10 MG/ML IV SOLN: 1000.0000 mg | Freq: Once | INTRAVENOUS | Status: DC

## 2012-08-10 MED ORDER — POTASSIUM CHLORIDE IN NACL 20-0.9 MEQ/L-% IV SOLN
INTRAVENOUS | Status: DC
  Administered 2012-08-10 – 2012-08-11 (×2): via INTRAVENOUS
  Filled 2012-08-10 (×5): qty 1000

## 2012-08-10 SURGICAL SUPPLY — 56 items
BAG SPEC THK2 15X12 ZIP CLS (MISCELLANEOUS) ×1
BAG ZIPLOCK 12X15 (MISCELLANEOUS) ×2 IMPLANT
BANDAGE ELASTIC 6 VELCRO ST LF (GAUZE/BANDAGES/DRESSINGS) ×2 IMPLANT
BANDAGE ESMARK 6X9 LF (GAUZE/BANDAGES/DRESSINGS) ×1 IMPLANT
BLADE SAG 18X100X1.27 (BLADE) ×2 IMPLANT
BLADE SAW SGTL 11.0X1.19X90.0M (BLADE) ×2 IMPLANT
BNDG CMPR 9X6 STRL LF SNTH (GAUZE/BANDAGES/DRESSINGS) ×1
BNDG ESMARK 6X9 LF (GAUZE/BANDAGES/DRESSINGS) ×2
BOWL SMART MIX CTS (DISPOSABLE) ×2 IMPLANT
CEMENT HV SMART SET (Cement) ×4 IMPLANT
CLOTH BEACON ORANGE TIMEOUT ST (SAFETY) ×2 IMPLANT
CUFF TOURN SGL QUICK 34 (TOURNIQUET CUFF) ×2
CUFF TRNQT CYL 34X4X40X1 (TOURNIQUET CUFF) ×1 IMPLANT
DRAPE EXTREMITY T 121X128X90 (DRAPE) ×2 IMPLANT
DRAPE POUCH INSTRU U-SHP 10X18 (DRAPES) ×2 IMPLANT
DRAPE U-SHAPE 47X51 STRL (DRAPES) ×2 IMPLANT
DRSG ADAPTIC 3X8 NADH LF (GAUZE/BANDAGES/DRESSINGS) ×2 IMPLANT
DURAPREP 26ML APPLICATOR (WOUND CARE) ×2 IMPLANT
ELECT REM PT RETURN 9FT ADLT (ELECTROSURGICAL) ×2
ELECTRODE REM PT RTRN 9FT ADLT (ELECTROSURGICAL) ×1 IMPLANT
EVACUATOR 1/8 PVC DRAIN (DRAIN) ×2 IMPLANT
FACESHIELD LNG OPTICON STERILE (SAFETY) ×10 IMPLANT
GLOVE BIO SURGEON STRL SZ7.5 (GLOVE) ×2 IMPLANT
GLOVE BIO SURGEON STRL SZ8 (GLOVE) ×2 IMPLANT
GLOVE BIOGEL PI IND STRL 8 (GLOVE) ×2 IMPLANT
GLOVE BIOGEL PI INDICATOR 8 (GLOVE) ×2
GLOVE SURG SS PI 6.5 STRL IVOR (GLOVE) ×4 IMPLANT
GOWN STRL NON-REIN LRG LVL3 (GOWN DISPOSABLE) ×4 IMPLANT
GOWN STRL REIN XL XLG (GOWN DISPOSABLE) ×2 IMPLANT
HANDPIECE INTERPULSE COAX TIP (DISPOSABLE) ×2
IMMOBILIZER KNEE 20 (SOFTGOODS) ×2
IMMOBILIZER KNEE 20 THIGH 36 (SOFTGOODS) ×1 IMPLANT
KIT BASIN OR (CUSTOM PROCEDURE TRAY) ×2 IMPLANT
MANIFOLD NEPTUNE II (INSTRUMENTS) ×2 IMPLANT
NDL SAFETY ECLIPSE 18X1.5 (NEEDLE) ×1 IMPLANT
NEEDLE HYPO 18GX1.5 SHARP (NEEDLE) ×2
NS IRRIG 1000ML POUR BTL (IV SOLUTION) ×2 IMPLANT
PACK TOTAL JOINT (CUSTOM PROCEDURE TRAY) ×2 IMPLANT
PAD ABD 7.5X8 STRL (GAUZE/BANDAGES/DRESSINGS) ×2 IMPLANT
PADDING CAST COTTON 6X4 STRL (CAST SUPPLIES) ×6 IMPLANT
PADDING CAST SYN 6 (CAST SUPPLIES) ×1
PADDING CAST SYNTHETIC 6X4 NS (CAST SUPPLIES) ×1 IMPLANT
POSITIONER SURGICAL ARM (MISCELLANEOUS) ×2 IMPLANT
SET HNDPC FAN SPRY TIP SCT (DISPOSABLE) ×1 IMPLANT
SPONGE GAUZE 4X4 12PLY (GAUZE/BANDAGES/DRESSINGS) ×2 IMPLANT
STRIP CLOSURE SKIN 1/2X4 (GAUZE/BANDAGES/DRESSINGS) ×4 IMPLANT
SUCTION FRAZIER 12FR DISP (SUCTIONS) ×2 IMPLANT
SUT MNCRL AB 4-0 PS2 18 (SUTURE) ×2 IMPLANT
SUT VIC AB 2-0 CT1 27 (SUTURE) ×6
SUT VIC AB 2-0 CT1 TAPERPNT 27 (SUTURE) ×3 IMPLANT
SUT VLOC 180 0 24IN GS25 (SUTURE) ×2 IMPLANT
SYR 50ML LL SCALE MARK (SYRINGE) ×2 IMPLANT
TOWEL OR 17X26 10 PK STRL BLUE (TOWEL DISPOSABLE) ×4 IMPLANT
TRAY FOLEY CATH 14FRSI W/METER (CATHETERS) ×2 IMPLANT
WATER STERILE IRR 1500ML POUR (IV SOLUTION) ×2 IMPLANT
WRAP KNEE MAXI GEL POST OP (GAUZE/BANDAGES/DRESSINGS) ×4 IMPLANT

## 2012-08-10 NOTE — Anesthesia Procedure Notes (Signed)
Spinal  Patient location during procedure: OR Staffing Performed by: anesthesiologist  Preanesthetic Checklist Completed: patient identified, site marked, surgical consent, pre-op evaluation, timeout performed, IV checked, risks and benefits discussed and monitors and equipment checked Spinal Block Patient position: sitting Prep: Betadine Patient monitoring: heart rate, continuous pulse ox and blood pressure Location: L4-5 Injection technique: single-shot Needle Needle type: Spinocan  Needle gauge: 22 G Needle length: 9 cm Additional Notes Expiration date of kit checked and confirmed. Patient tolerated procedure well, without complications.     

## 2012-08-10 NOTE — Preoperative (Signed)
Beta Blockers   Reason not to administer Beta Blockers:Not Applicable 

## 2012-08-10 NOTE — Op Note (Signed)
Pre-operative diagnosis- Osteoarthritis  Right knee(s)  Post-operative diagnosis- Osteoarthritis Right knee(s)  Procedure-  Right  Total Knee Arthroplasty  Surgeon- Gus Rankin. Juno Alers, MD  Assistant- Leilani Able, PA-C   Anesthesia-  Spinal EBL-* No blood loss amount entered *  Drains Hemovac  Tourniquet time-  Total Tourniquet Time Documented: Thigh (Right) - 36 minutes Total: Thigh (Right) - 36 minutes    Complications- None  Condition-PACU - hemodynamically stable.   Brief Clinical Note  Vicki Krueger is a 62 y.o. year old female with end stage OA of her right knee with progressively worsening pain and dysfunction. She has constant pain, with activity and at rest and significant functional deficits with difficulties even with ADLs. She has had extensive non-op management including analgesics, injections of cortisone, and home exercise program, but remains in significant pain with significant dysfunction.Radiographs show bone on bone arthritis lateral and patellofemoral. She presents now for right Total Knee Arthroplasty.    Procedure in detail---   The patient is brought into the operating room and positioned supine on the operating table. After successful administration of  Spinal,   a tourniquet is placed high on the  Right thigh(s) and the lower extremity is prepped and draped in the usual sterile fashion. Time out is performed by the operating team and then the  Right lower extremity is wrapped in Esmarch, knee flexed and the tourniquet inflated to 300 mmHg.       A midline incision is made with a ten blade through the subcutaneous tissue to the level of the extensor mechanism. A fresh blade is used to make a medial parapatellar arthrotomy. Soft tissue over the proximal medial tibia is subperiosteally elevated to the joint line with a knife and into the semimembranosus bursa with a Cobb elevator. Soft tissue over the proximal lateral tibia is elevated with attention being paid to  avoiding the patellar tendon on the tibial tubercle. The patella is everted, knee flexed 90 degrees and the ACL and PCL are removed. Findings are bone on bone lateral and patellofemoral with large lateral osteophytes.        The drill is used to create a starting hole in the distal femur and the canal is thoroughly irrigated with sterile saline to remove the fatty contents. The 5 degree Right  valgus alignment guide is placed into the femoral canal and the distal femoral cutting block is pinned to remove 10 mm off the distal femur. Resection is made with an oscillating saw.      The tibia is subluxed forward and the menisci are removed. The extramedullary alignment guide is placed referencing proximally at the medial aspect of the tibial tubercle and distally along the second metatarsal axis and tibial crest. The block is pinned to remove 2mm off the more deficient lateral  side. Resection is made with an oscillating saw. Size 2is the most appropriate size for the tibia and the proximal tibia is prepared with the modular drill and keel punch for that size.      The femoral sizing guide is placed and size 2.5 is most appropriate. Rotation is marked off the epicondylar axis and confirmed by creating a rectangular flexion gap at 90 degrees. The size 2.5 cutting block is pinned in this rotation and the anterior, posterior and chamfer cuts are made with the oscillating saw. The intercondylar block is then placed and that cut is made.      Trial size 2 tibial component, trial size 2.5 posterior stabilized femur  and a 10  mm posterior stabilized rotating platform insert trial is placed. Full extension is achieved with excellent varus/valgus and anterior/posterior balance throughout full range of motion. The patella is everted and thickness measured to be 22  mm. Free hand resection is taken to 12 mm, a 35 template is placed, lug holes are drilled, trial patella is placed, and it tracks normally. Osteophytes are removed  off the posterior femur with the trial in place. All trials are removed and the cut bone surfaces prepared with pulsatile lavage. Cement is mixed and once ready for implantation, the size 2 tibial implant, size  2.5 posterior stabilized femoral component, and the size 35 patella are cemented in place and the patella is held with the clamp. The trial insert is placed and the knee held in full extension. The Exparel (20 ml mixed with 50 ml saline) is injected into the extensor mechanism, posterior capsule, medial and lateral gutters and subcutaneous tissues.  All extruded cement is removed and once the cement is hard the permanent 10 mm posterior stabilized rotating platform insert is placed into the tibial tray.      The wound is copiously irrigated with saline solution and the extensor mechanism closed over a hemovac drain with #1 PDS suture. The tourniquet is released for a total tourniquet time of 36  minutes. Flexion against gravity is 135 degrees and the patella tracks normally. Subcutaneous tissue is closed with 2.0 vicryl and subcuticular with running 4.0 Monocryl. The incision is cleaned and dried and steri-strips and a bulky sterile dressing are applied. The limb is placed into a knee immobilizer and the patient is awakened and transported to recovery in stable condition.      Please note that a surgical assistant was a medical necessity for this procedure in order to perform it in a safe and expeditious manner. Surgical assistant was necessary to retract the ligaments and vital neurovascular structures to prevent injury to them and also necessary for proper positioning of the limb to allow for anatomic placement of the prosthesis.   Gus Rankin Kaileah Shevchenko, MD    08/10/2012, 8:21 AM

## 2012-08-10 NOTE — Transfer of Care (Signed)
Immediate Anesthesia Transfer of Care Note  Patient: Vicki Krueger  Procedure(s) Performed: Procedure(s): TOTAL KNEE ARTHROPLASTY (Right)  Patient Location: PACU  Anesthesia Type:Spinal  Level of Consciousness: awake, alert  and patient cooperative  Airway & Oxygen Therapy: Patient Spontanous Breathing and Patient connected to face mask oxygen  Post-op Assessment: Report given to PACU RN and Post -op Vital signs reviewed and stable  Post vital signs: stable  Complications: No apparent anesthesia complications

## 2012-08-10 NOTE — Anesthesia Preprocedure Evaluation (Signed)
Anesthesia Evaluation  Patient identified by MRN, date of birth, ID band Patient awake    Reviewed: Allergy & Precautions, H&P , NPO status , Patient's Chart, lab work & pertinent test results  Airway Mallampati: III TM Distance: <3 FB Neck ROM: Full    Dental no notable dental hx.    Pulmonary neg pulmonary ROS,  breath sounds clear to auscultation  Pulmonary exam normal       Cardiovascular hypertension, Pt. on medications Rhythm:Regular Rate:Normal     Neuro/Psych negative neurological ROS  negative psych ROS   GI/Hepatic negative GI ROS, GERD-  Medicated,(+) Hepatitis -, C  Endo/Other  negative endocrine ROS  Renal/GU negative Renal ROS  negative genitourinary   Musculoskeletal negative musculoskeletal ROS (+)   Abdominal   Peds negative pediatric ROS (+)  Hematology negative hematology ROS (+)   Anesthesia Other Findings   Reproductive/Obstetrics negative OB ROS                           Anesthesia Physical Anesthesia Plan  ASA: III  Anesthesia Plan: Spinal   Post-op Pain Management:    Induction:   Airway Management Planned: Simple Face Mask  Additional Equipment:   Intra-op Plan:   Post-operative Plan:   Informed Consent: I have reviewed the patients History and Physical, chart, labs and discussed the procedure including the risks, benefits and alternatives for the proposed anesthesia with the patient or authorized representative who has indicated his/her understanding and acceptance.     Plan Discussed with: CRNA and Surgeon  Anesthesia Plan Comments:         Anesthesia Quick Evaluation

## 2012-08-10 NOTE — Anesthesia Postprocedure Evaluation (Signed)
  Anesthesia Post-op Note  Patient: Vicki Krueger  Procedure(s) Performed: Procedure(s) (LRB): TOTAL KNEE ARTHROPLASTY (Right)  Patient Location: PACU  Anesthesia Type: Spinal  Level of Consciousness: awake and alert   Airway and Oxygen Therapy: Patient Spontanous Breathing  Post-op Pain: mild  Post-op Assessment: Post-op Vital signs reviewed, Patient's Cardiovascular Status Stable, Respiratory Function Stable, Patent Airway and No signs of Nausea or vomiting  Last Vitals:  Filed Vitals:   08/10/12 0915  BP: 88/54  Pulse: 64  Temp:   Resp: 14    Post-op Vital Signs: stable   Complications: No apparent anesthesia complications

## 2012-08-10 NOTE — Interval H&P Note (Signed)
History and Physical Interval Note:  08/10/2012 7:04 AM  Vicki Krueger  has presented today for surgery, with the diagnosis of oa right knee   The various methods of treatment have been discussed with the patient and family. After consideration of risks, benefits and other options for treatment, the patient has consented to  Procedure(s): TOTAL KNEE ARTHROPLASTY (Right) as a surgical intervention .  The patient's history has been reviewed, patient examined, no change in status, stable for surgery.  I have reviewed the patient's chart and labs.  Questions were answered to the patient's satisfaction.     Loanne Drilling

## 2012-08-10 NOTE — Evaluation (Signed)
Physical Therapy Evaluation Patient Details Name: ANIELLE HEADRICK MRN: [id number] DOB: Mar 05, 1951 Today's Date: 08/10/2012 Time: 7829-5621 PT Time Calculation (min): 32 min  PT Assessment / Plan / Recommendation Clinical Impression  Pt s/p R TKR presents with decreased R LE strength/ROM and post op pain limiting functional mobility    PT Assessment  Patient needs continued PT services    Follow Up Recommendations  Home health PT    Does the patient have the potential to tolerate intense rehabilitation      Barriers to Discharge None      Equipment Recommendations  Rolling walker with 5" wheels (Pt is 4'11)    Recommendations for Other Services OT consult   Frequency 7X/week    Precautions / Restrictions Precautions Precautions: Knee;Fall Required Braces or Orthoses: Knee Immobilizer - Right Knee Immobilizer - Right: Discontinue once straight leg raise with < 10 degree lag Restrictions Weight Bearing Restrictions: No Other Position/Activity Restrictions: WBAT   Pertinent Vitals/Pain 5/10; premed, ice packs provided      Mobility  Bed Mobility Bed Mobility: Supine to Sit Supine to Sit: 4: Min assist Details for Bed Mobility Assistance: min assist with R LE with min cues for sequence Transfers Transfers: Sit to Stand;Stand to Sit Sit to Stand: 4: Min assist Stand to Sit: 4: Min assist Details for Transfer Assistance: cues for LE management and use of UES to self assist Ambulation/Gait Ambulation/Gait Assistance: 4: Min assist Ambulation Distance (Feet): 42 Feet Assistive device: Rolling walker Ambulation/Gait Assistance Details: cues for sequence, posture, and position from RW Gait Pattern: Step-to pattern;Decreased step length - right;Decreased step length - left;Decreased stance time - right Stairs: No    Exercises Total Joint Exercises Ankle Circles/Pumps: AROM;10 reps;Supine;Both Quad Sets: AROM;10 reps;Supine;Both Heel Slides: AROM;10  reps;Supine;Right Straight Leg Raises: AAROM;AROM;Right;Supine;15 reps   PT Diagnosis: Difficulty walking  PT Problem List: Decreased strength;Decreased range of motion;Decreased activity tolerance;Decreased mobility;Decreased knowledge of use of DME;Pain PT Treatment Interventions: DME instruction;Gait training;Stair training;Functional mobility training;Therapeutic activities;Therapeutic exercise;Patient/family education   PT Goals Acute Rehab PT Goals PT Goal Formulation: With patient Time For Goal Achievement: 08/17/12 Potential to Achieve Goals: Good Pt will go Supine/Side to Sit: with supervision PT Goal: Supine/Side to Sit - Progress: Goal set today Pt will go Sit to Supine/Side: with supervision PT Goal: Sit to Supine/Side - Progress: Goal set today Pt will go Sit to Stand: with supervision PT Goal: Sit to Stand - Progress: Goal set today Pt will go Stand to Sit: with supervision PT Goal: Stand to Sit - Progress: Goal set today Pt will Ambulate: 51 - 150 feet;with supervision;with rolling walker PT Goal: Ambulate - Progress: Goal set today Pt will Go Up / Down Stairs: 1-2 stairs;with min assist;with least restrictive assistive device PT Goal: Up/Down Stairs - Progress: Goal set today  Visit Information  Last PT Received On: 08/10/12 Assistance Needed: +1    Subjective Data  Subjective: I am ready to get out of this bed Patient Stated Goal: Resume previous lifestyle with decreased pain   Prior Functioning  Home Living Lives With: Spouse Available Help at Discharge: Family Type of Home: House Home Access: Stairs to enter Secretary/administrator of Steps: 2 Entrance Stairs-Rails: None Home Layout: Able to live on main level with bedroom/bathroom Prior Function Level of Independence: Independent Able to Take Stairs?: Yes Driving: Yes Vocation: Full time employment Communication Communication: No difficulties Dominant Hand: Right    Cognition   Cognition Arousal/Alertness: Awake/alert Behavior During Therapy: [facility] for tasks  assessed/performed Overall Cognitive Status: Within Functional Limits for tasks assessed    Extremity/Trunk Assessment Right Upper Extremity Assessment RUE ROM/Strength/Tone: [facility] for tasks assessed Left Upper Extremity Assessment LUE ROM/Strength/Tone: WFL for tasks assessed Right Lower Extremity Assessment RLE ROM/Strength/Tone: Deficits RLE ROM/Strength/Tone Deficits: 3/5 quads with -10 - 70 AAROM Left Lower Extremity Assessment LLE ROM/Strength/Tone: WFL for tasks assessed Trunk Assessment Trunk Assessment: Normal   Balance    End of Session PT - End of Session Equipment Utilized During Treatment: Gait belt Activity Tolerance: Patient tolerated treatment well Patient left: in chair;with call bell/phone within reach;with family/visitor present Nurse Communication: Mobility status CPM Right Knee CPM Right Knee: Off  GP     Florence Antonelli 08/10/2012, 5:19 PM

## 2012-08-11 ENCOUNTER — Encounter (HOSPITAL_COMMUNITY): Payer: Self-pay | Admitting: Orthopedic Surgery

## 2012-08-11 LAB — BASIC METABOLIC PANEL
Chloride: 104 mEq/L (ref 96–112)
Creatinine, Ser: 0.74 mg/dL (ref 0.50–1.10)
GFR calc Af Amer: >90 mL/min (ref >90)
Sodium: 140 mEq/L (ref 135–145)

## 2012-08-11 LAB — CBC
MCV: 90.2 fL (ref 78.0–100.0)
Platelets: 208 10*3/uL (ref 150–400)
RDW: 12.6 % (ref 11.5–15.5)
WBC: 10.4 10*3/uL (ref 4.0–10.5)

## 2012-08-11 MED ORDER — POTASSIUM CHLORIDE CRYS ER 20 MEQ PO TBCR
40.0000 meq | EXTENDED_RELEASE_TABLET | Freq: Two times a day (BID) | ORAL | Status: AC
  Administered 2012-08-11 (×2): 40 meq via ORAL
  Filled 2012-08-11 (×2): qty 2

## 2012-08-11 MED ORDER — RIVAROXABAN 10 MG PO TABS: 10.0000 mg | ORAL_TABLET | Freq: Every day | ORAL | Status: DC

## 2012-08-11 MED ORDER — TRAMADOL HCL 50 MG PO TABS: 50.0000 mg | ORAL_TABLET | Freq: Four times a day (QID) | ORAL | Status: DC | PRN

## 2012-08-11 MED ORDER — POTASSIUM CHLORIDE CRYS ER 20 MEQ PO TBCR
20.0000 meq | EXTENDED_RELEASE_TABLET | Freq: Two times a day (BID) | ORAL | Status: DC
  Filled 2012-08-11 (×2): qty 1

## 2012-08-11 MED ORDER — OXYCODONE HCL 5 MG PO TABS: 5.0000 mg | ORAL_TABLET | ORAL | Status: DC | PRN

## 2012-08-11 MED ORDER — METHOCARBAMOL 500 MG PO TABS: 500.0000 mg | ORAL_TABLET | Freq: Four times a day (QID) | ORAL | Status: DC | PRN

## 2012-08-11 NOTE — Evaluation (Signed)
Occupational Therapy Evaluation Patient Details Name: Vicki Krueger MRN: [id number] DOB: 03-07-1951 Today's Date: 08/11/2012 Time: 1308-6578 OT Time Calculation (min): 25 min  OT Assessment / Plan / Recommendation Clinical Impression   This 62 y.o. Female admitted for Rt. TKA.  Pt. Demonstrates good safety awareness and ability to perform BADLs with supervision.  All education completed.  No further OT needs identified.  Will sign off.     OT Assessment  Patient does not need any further OT services    Follow Up Recommendations  No OT follow up;Supervision - Intermittent    Barriers to Discharge      Equipment Recommendations  None recommended by OT    Recommendations for Other Services    Frequency       Precautions / Restrictions Precautions Precautions: Knee;Fall Required Braces or Orthoses: Knee Immobilizer - Right Knee Immobilizer - Right: Discontinue once straight leg raise with < 10 degree lag Restrictions Weight Bearing Restrictions: No       ADL  Eating/Feeding: Independent Where Assessed - Eating/Feeding: Chair Grooming: Wash/dry hands;Wash/dry face;Teeth care;Supervision/safety Where Assessed - Grooming: Unsupported standing Upper Body Bathing: Set up Where Assessed - Upper Body Bathing: Unsupported sitting Lower Body Bathing: Supervision/safety Where Assessed - Lower Body Bathing: Unsupported sit to stand Upper Body Dressing: Set up Where Assessed - Upper Body Dressing: Unsupported sitting Lower Body Dressing: Supervision/safety Where Assessed - Lower Body Dressing: Unsupported sit to stand Toilet Transfer: Supervision/safety Toilet Transfer Method: Sit to Barista: Regular height toilet;Comfort height toilet;Grab bars Toileting - Clothing Manipulation and Hygiene: Supervision/safety Where Assessed - Engineer, mining and Hygiene: Standing Equipment Used: Rolling walker Transfers/Ambulation Related to ADLs: S ADL  Comments: Pt able to easily access foot by leaning forward for lower body ADLs.  Discussed options for tub seat and how to perform tub transfer safely.  Pt verbalized understanding     OT Diagnosis:    OT Problem List:   OT Treatment Interventions:     OT Goals    Visit Information  Last OT Received On: 08/11/12 Assistance Needed: +1    Subjective Data  Subjective: "I think I have a shower seat that was my dad's that I can use" Patient Stated Goal: To regain independence to return to work   Prior Functioning     Home Living Lives With: Spouse Available Help at Discharge: Family Type of Home: House Home Access: Stairs to enter Secretary/administrator of Steps: 2 Entrance Stairs-Rails: None Home Layout: Able to live on main level with bedroom/bathroom Bathroom Shower/Tub: Tub/shower unit;Curtain Firefighter: Standard Bathroom Accessibility: Yes How Accessible: Accessible via walker Prior Function Level of Independence: Independent Able to Take Stairs?: Yes Driving: Yes Vocation: Full time employment Communication Communication: No difficulties Dominant Hand: Right         Vision/Perception     Cognition  Cognition Arousal/Alertness: Awake/alert Behavior During Therapy: WFL for tasks assessed/performed Overall Cognitive Status: Within Functional Limits for tasks assessed    Extremity/Trunk Assessment Right Upper Extremity Assessment RUE ROM/Strength/Tone: WFL for tasks assessed RUE Coordination: WFL - gross/fine motor Left Upper Extremity Assessment LUE ROM/Strength/Tone: Within functional levels LUE Coordination: WFL - gross/fine motor Trunk Assessment Trunk Assessment: Normal     Mobility Bed Mobility Bed Mobility: Not assessed Transfers Transfers: Sit to Stand Sit to Stand: 5: Supervision;With upper extremity assist;From chair/3-in-1 Stand to Sit: 5: Supervision;With upper extremity assist;To chair/3-in-1     Exercise     Balance     End  of Session OT - End of Session Activity Tolerance: Patient tolerated treatment well Patient left: in chair;with call bell/phone within reach  GO     Reighlynn Swiney M 08/11/2012, 11:54 AM

## 2012-08-11 NOTE — Progress Notes (Signed)
Utilization review completed.  

## 2012-08-11 NOTE — Progress Notes (Signed)
Physical Therapy Treatment Patient Details Name: Vicki Krueger MRN: [id number] DOB: 19-Jan-1951 Today's Date: 08/11/2012 Time: 7829-5621 PT Time Calculation (min): 26 min  PT Assessment / Plan / Recommendation Comments on Treatment Session       Follow Up Recommendations  Home health PT     Does the patient have the potential to tolerate intense rehabilitation     Barriers to Discharge        Equipment Recommendations  Rolling walker with 5" wheels    Recommendations for Other Services OT consult  Frequency 7X/week   Plan Discharge plan remains appropriate    Precautions / Restrictions Precautions Precautions: Knee;Fall Required Braces or Orthoses: Knee Immobilizer - Right Knee Immobilizer - Right: Discontinue once straight leg raise with < 10 degree lag Restrictions Weight Bearing Restrictions: No Other Position/Activity Restrictions: WBAT   Pertinent Vitals/Pain 4/10; meds requested    Mobility  Bed Mobility Bed Mobility: Sit to Supine Sit to Supine: 4: Min guard Transfers Transfers: Sit to Stand;Stand to Sit Sit to Stand: 5: Supervision Stand to Sit: 5: Supervision Ambulation/Gait Ambulation/Gait Assistance: 4: Min guard;5: Supervision Ambulation Distance (Feet): 200 Feet Assistive device: Rolling walker Ambulation/Gait Assistance Details: min cues for position from RW Gait Pattern: Step-to pattern;Decreased step length - right;Decreased step length - left;Decreased stance time - right Stairs: Yes Stairs Assistance: 4: Min assist Stairs Assistance Details (indicate cue type and reason): cues for sequence and foot/RW placement Stair Management Technique: No rails;Backwards;With walker;Step to pattern Number of Stairs: 2    Exercises     PT Diagnosis:    PT Problem List:   PT Treatment Interventions:     PT Goals Acute Rehab PT Goals PT Goal Formulation: With patient Time For Goal Achievement: 08/17/12 Potential to Achieve Goals: Good Pt will go  Supine/Side to Sit: with supervision PT Goal: Supine/Side to Sit - Progress: Progressing toward goal Pt will go Sit to Supine/Side: with supervision PT Goal: Sit to Supine/Side - Progress: Progressing toward goal Pt will go Sit to Stand: with supervision PT Goal: Sit to Stand - Progress: Progressing toward goal Pt will go Stand to Sit: with supervision PT Goal: Stand to Sit - Progress: Progressing toward goal Pt will Ambulate: 51 - 150 feet;with supervision;with rolling walker PT Goal: Ambulate - Progress: Progressing toward goal Pt will Go Up / Down Stairs: 1-2 stairs;with min assist;with least restrictive assistive device PT Goal: Up/Down Stairs - Progress: Progressing toward goal  Visit Information  Last PT Received On: 08/11/12 Assistance Needed: +1    Subjective Data  Patient Stated Goal: Resume previous lifestyle with decreased pain   Cognition  Cognition Arousal/Alertness: Awake/alert Behavior During Therapy: WFL for tasks assessed/performed Overall Cognitive Status: Within Functional Limits for tasks assessed    Balance     End of Session PT - End of Session Activity Tolerance: Patient tolerated treatment well Patient left: in bed;with call bell/phone within reach Nurse Communication: Mobility status   GP     Kyann Heydt 08/11/2012, 2:55 PM

## 2012-08-11 NOTE — Progress Notes (Signed)
Physical Therapy Treatment Patient Details Name: Vicki Krueger MRN: [id number] DOB: [birth year] Today's Date: 08/11/2012 Time: 0920-0947 PT Time Calculation (min): 27 min  PT Assessment / Plan / Recommendation Comments on Treatment Session       Follow Up Recommendations  Home health PT     Does the patient have the potential to tolerate intense rehabilitation     Barriers to Discharge        Equipment Recommendations  Rolling walker with 5" wheels    Recommendations for Other Services OT consult  Frequency 7X/week   Plan Discharge plan remains appropriate    Precautions / Restrictions Precautions Precautions: Knee;Fall Required Braces or Orthoses: Knee Immobilizer - Right Knee Immobilizer - Right: Discontinue once straight leg raise with < 10 degree lag Restrictions Weight Bearing Restrictions: No   Pertinent Vitals/Pain     Mobility  Bed Mobility Bed Mobility: Not assessed Supine to Sit: 4: Min guard Transfers Transfers: Sit to Stand;Stand to Sit Sit to Stand: 5: Supervision;With upper extremity assist;From chair/3-in-1 Stand to Sit: 5: Supervision;With upper extremity assist;To chair/3-in-1 Details for Transfer Assistance: cues for LE management and use of UES to self assist Ambulation/Gait Ambulation/Gait Assistance: 4: Min guard Ambulation Distance (Feet): 121 Feet Assistive device: Rolling walker Ambulation/Gait Assistance Details: cues for posture, position from RW and stride length Gait Pattern: Step-to pattern;Decreased step length - right;Decreased step length - left;Decreased stance time - right Stairs: No    Exercises Total Joint Exercises Ankle Circles/Pumps: AROM;Supine;Both;15 reps Quad Sets: AROM;Supine;Both;15 reps Heel Slides: AROM;Supine;Right;15 reps Straight Leg Raises: AAROM;AROM;Right;Supine;15 reps   PT Diagnosis:    PT Problem List:   PT Treatment Interventions:     PT Goals Acute Rehab PT Goals PT Goal Formulation: With  patient Time For Goal Achievement: 08/17/12 Potential to Achieve Goals: Good Pt will go Supine/Side to Sit: with supervision PT Goal: Supine/Side to Sit - Progress: Progressing toward goal Pt will go Sit to Supine/Side: with supervision PT Goal: Sit to Supine/Side - Progress: Progressing toward goal Pt will go Sit to Stand: with supervision PT Goal: Sit to Stand - Progress: Progressing toward goal Pt will go Stand to Sit: with supervision PT Goal: Stand to Sit - Progress: Progressing toward goal Pt will Ambulate: 51 - 150 feet;with supervision;with rolling walker PT Goal: Ambulate - Progress: Progressing toward goal Pt will Go Up / Down Stairs: 1-2 stairs;with min assist;with least restrictive assistive device  Visit Information  Last PT Received On: 08/11/12 Assistance Needed: +1    Subjective Data  Patient Stated Goal: Resume previous lifestyle with decreased pain   Cognition  Cognition Arousal/Alertness: Awake/alert Behavior During Therapy: WFL for tasks assessed/performed Overall Cognitive Status: Within Functional Limits for tasks assessed    Balance     End of Session PT - End of Session Equipment Utilized During Treatment: Gait belt Activity Tolerance: Patient tolerated treatment well Patient left: in chair;with call bell/phone within reach;with family/visitor present Nurse Communication: Mobility status   GP     Vicki Krueger 08/11/2012, 1:19 PM

## 2012-08-11 NOTE — Care Management Note (Signed)
    Page 1 of 1   08/11/2012     4:52:35 PM   CARE MANAGEMENT NOTE 08/11/2012  Patient:  Vicki Krueger, Vicki Krueger   Account Number:  [id number]  Date Initiated:  08/11/2012  Documentation initiated by:  Lanier Clam  Subjective/Objective Assessment:   ADMITTED W/OA R KNEE.     Action/Plan:   FROM HOME W/SPOUSE.   Anticipated DC Date:  08/12/2012   Anticipated DC Plan:  HOME W HOME HEALTH SERVICES      DC Planning Services  CM consult      Choice offered to / List presented to:             Status of service:  In process, will continue to follow Medicare Important Message given?   (If response is "NO", the following Medicare IM given date fields will be blank) Date Medicare IM given:   Date Additional Medicare IM given:    Discharge Disposition:    Per UR Regulation:  Reviewed for med. necessity/level of care/duration of stay  If discussed at Long Length of Stay Meetings, dates discussed:    Comments:  08/11/12 [facility] RN,BSN NCM [phone] TC WORKER[employer]VE[employer] LEFT VM W/PATRICIA HENDERSON TEL#[phone] [id number] ABOUT HHC AGENCY,& DME PROVIDER.PT-HH,RW.OT-NA.

## 2012-08-11 NOTE — Progress Notes (Signed)
   Subjective: 1 Day Post-Op Procedure(s) (LRB): TOTAL KNEE ARTHROPLASTY (Right) Patient reports pain as mild.   Patient seen in rounds with Dr. Lequita Halt. Patient is well, and has had no acute complaints or problems. She reports that she is doing well, already bathed and ready today. No issues overnight. No complaints of chest pain or shortness of breath.  We will continue therapy today.  Plan is to go Home after hospital stay.  Objective: Vital signs in last 24 hours: Temp:  [97.3 F (36.3 C)-99 F (37.2 C)] 97.9 F (36.6 C) (04/24 0539) Pulse Rate:  [59-81] 76 (04/24 0539) Resp:  [11-18] 16 (04/24 0539) BP: (82-132)/(49-83) 105/66 mmHg (04/24 0539) SpO2:  [96 %-100 %] 96 % (04/24 0539) Weight:  [77.111 kg (170 lb)] 77.111 kg (170 lb) (04/23 1225)  Intake/Output from previous day:  Intake/Output Summary (Last 24 hours) at 08/11/12 0712 Last data filed at 08/11/12 0600  Gross per 24 hour  Intake 4947.5 ml  Output   2920 ml  Net 2027.5 ml     Labs:  Recent Labs  08/08/12 1600 08/11/12 0428  HGB 13.3 11.9*    Recent Labs  08/08/12 1600 08/11/12 0428  WBC 7.7 10.4  RBC 4.41 3.96  HCT 39.2 35.7*  PLT 250 208    Recent Labs  08/08/12 1600 08/11/12 0428  NA 138 140  K 3.1* 3.2*  CL 101 104  CO2 25 27  BUN 11 8  CREATININE 0.87 0.74  GLUCOSE 124* 124*  CALCIUM 9.5 8.6    Recent Labs  08/08/12 1600  INR 1.05    EXAM General - Patient is Alert and Oriented Extremity - Neurologically intact Dorsiflexion/Plantar flexion intact Dressing - dressing C/D/I Motor Function - intact, moving foot and toes well on exam.  Hemovac pulled without difficulty.  Past Medical History  Diagnosis Date  . Hypertension   . GERD (gastroesophageal reflux disease)   . H/O hiatal hernia   . Arthritis   . Acute meniscal tear of knee RIGHT  . Swelling of right knee joint   . Left knee pain   . Cataract immature   . H/O cardiac murmur   . History of hepatitis C 2006   PER BLOOD TEST -- TX FOR 1 YR--  NO SYMPTOMS SINCE--  POSS. DUE TO NEEDLE STICK  (PT A NURSE)  . History of meningitis X4  LAST HAD IN 2007  . Heart murmur   . Hepatitis 2006    treatment x 1 year no problem now    Assessment/Plan: 1 Day Post-Op Procedure(s) (LRB): TOTAL KNEE ARTHROPLASTY (Right) Principal Problem:   OA (osteoarthritis) of knee  Estimated body mass index is 34.32 kg/(m^2) as calculated from the following:   Height as of this encounter: 4\' 11"  (1.499 m).   Weight as of this encounter: 77.111 kg (170 lb). Advance diet Up with therapy D/C IV fluids when tolerating PO well Plan for discharge tomorrow with [facility] [place]  DVT Prophylaxis - Xarelto Weight-Bearing as tolerated No vaccines. D/C O2 and Pulse OX and try on Room Air  She is doing great. Will continue therapy today and likely discharge home tomorrow. Will start KDur today.   Vicki Krueger Vicki Krueger 08/11/2012, 7:12 AM

## 2012-08-12 LAB — BASIC METABOLIC PANEL
CO2: 27 mEq/L (ref 19–32)
Calcium: 8.6 mg/dL (ref 8.4–10.5)
Creatinine, Ser: 0.83 mg/dL (ref 0.50–1.10)
GFR calc Af Amer: 87 mL/min — ABNORMAL LOW (ref >90)

## 2012-08-12 LAB — CBC
MCH: 30 pg (ref 26.0–34.0)
MCHC: 33.3 g/dL (ref 30.0–36.0)
MCV: 89.9 fL (ref 78.0–100.0)
Platelets: 201 10*3/uL (ref 150–400)
RDW: 13 % (ref 11.5–15.5)

## 2012-08-12 MED ORDER — SALINE SPRAY 0.65 % NA SOLN
1.0000 | NASAL | Status: DC | PRN
  Filled 2012-08-12: qty 44

## 2012-08-12 NOTE — Progress Notes (Signed)
Patient has had 2 small nosebleeds today instructed to not blow nose and use nasal saline as ordered A [facility] page with no new orders received.  Sharrell Ku RN

## 2012-08-12 NOTE — Progress Notes (Signed)
HHPT services approved to be provided by [facility]. Youth rolling walker delivered to room as well.

## 2012-08-12 NOTE — Progress Notes (Signed)
Physical Therapy Treatment Patient Details Name: Vicki Krueger MRN: [id number] DOB: 09-Dec-1950 Today's Date: 08/12/2012 Time: 1914-7829 PT Time Calculation (min): 33 min  PT Assessment / Plan / Recommendation Comments on Treatment Session       Follow Up Recommendations  Home health PT     Does the patient have the potential to tolerate intense rehabilitation     Barriers to Discharge        Equipment Recommendations  Rolling walker with 5" wheels    Recommendations for Other Services OT consult  Frequency 7X/week   Plan Discharge plan remains appropriate    Precautions / Restrictions Precautions Precautions: Knee;Fall Required Braces or Orthoses: Knee Immobilizer - Right Knee Immobilizer - Right: Discontinue once straight leg raise with < 10 degree lag (Pt performed IND SLR) Restrictions Weight Bearing Restrictions: No Other Position/Activity Restrictions: WBAT   Pertinent Vitals/Pain     Mobility  Bed Mobility Bed Mobility: Supine to Sit Supine to Sit: 5: Supervision Transfers Transfers: Sit to Stand;Stand to Sit Sit to Stand: 5: Supervision Stand to Sit: 5: Supervision Details for Transfer Assistance: cues for LE management and use of UES to self assist Ambulation/Gait Ambulation/Gait Assistance: 5: Supervision Ambulation Distance (Feet): 130 Feet Assistive device: Rolling walker Ambulation/Gait Assistance Details: min cues for position from RW Gait Pattern: Step-to pattern Stairs: Yes Stairs Assistance: 4: Min assist Stairs Assistance Details (indicate cue type and reason): min cues for hand placement on RW and foot placement on stairs Stair Management Technique: No rails;Backwards;With walker;Step to pattern Number of Stairs: 2    Exercises Total Joint Exercises Ankle Circles/Pumps: AROM;Supine;Both;15 reps Quad Sets: AROM;Supine;Both;20 reps Heel Slides: AROM;Supine;Right;20 reps Straight Leg Raises: AAROM;AROM;Right;Supine;20 reps   PT Diagnosis:     PT Problem List:   PT Treatment Interventions:     PT Goals Acute Rehab PT Goals PT Goal Formulation: With patient Time For Goal Achievement: 08/17/12 Potential to Achieve Goals: Good Pt will go Supine/Side to Sit: with supervision PT Goal: Supine/Side to Sit - Progress: Met Pt will go Sit to Supine/Side: with supervision PT Goal: Sit to Supine/Side - Progress: Met Pt will go Sit to Stand: with supervision PT Goal: Sit to Stand - Progress: Met Pt will go Stand to Sit: with supervision PT Goal: Stand to Sit - Progress: Met Pt will Ambulate: 51 - 150 feet;with supervision;with rolling walker PT Goal: Ambulate - Progress: Met Pt will Go Up / Down Stairs: 1-2 stairs;with min assist;with least restrictive assistive device PT Goal: Up/Down Stairs - Progress: Met  Visit Information  Last PT Received On: 08/12/12 Assistance Needed: +1    Subjective Data  Subjective: I got myself in and out of bed this morning Patient Stated Goal: Resume previous lifestyle with decreased pain   Cognition  Cognition Arousal/Alertness: Awake/alert Behavior During Therapy: WFL for tasks assessed/performed Overall Cognitive Status: Within Functional Limits for tasks assessed    Balance     End of Session PT - End of Session Activity Tolerance: Patient tolerated treatment well Patient left: in chair;with call bell/phone within reach Nurse Communication: Mobility status   GP     Demonie Kassa 08/12/2012, 10:22 AM

## 2012-08-25 NOTE — Discharge Summary (Signed)
Physician Discharge Summary   Patient ID: Vicki Krueger MRN: [id number] DOB/AGE: 1950/10/27 62 y.o.  Admit date: 08/10/2012 Discharge date: 08/12/2012 Primary Diagnosis:  Osteoarthritis Right knee Admission Diagnoses:  Past Medical History  Diagnosis Date  . Hypertension   . GERD (gastroesophageal reflux disease)   . H/O hiatal hernia   . Arthritis   . Acute meniscal tear of knee RIGHT  . Swelling of right knee joint   . Left knee pain   . Cataract immature   . H/O cardiac murmur   . History of hepatitis C 2006  PER BLOOD TEST -- TX FOR 1 YR--  NO SYMPTOMS SINCE--  POSS. DUE TO NEEDLE STICK  (PT A NURSE)  . History of meningitis X4  LAST HAD IN 2007  . Heart murmur   . Hepatitis 2006    treatment x 1 year no problem now   Discharge Diagnoses:   Principal Problem:   OA (osteoarthritis) of knee  Estimated body mass index is 34.32 kg/(m^2) as calculated from the following:   Height as of this encounter: 4\' 11"  (1.499 m).   Weight as of this encounter: 77.111 kg (170 lb).  Procedure:  Procedure(s) (LRB): TOTAL KNEE ARTHROPLASTY (Right)   Consults: None  HPI: Vicki Krueger is a 62 y.o. year old female with end stage OA of her right knee with progressively worsening pain and dysfunction. She has constant pain, with activity and at rest and significant functional deficits with difficulties even with ADLs. She has had extensive non-op management including analgesics, injections of cortisone, and home exercise program, but remains in significant pain with significant dysfunction.Radiographs show bone on bone arthritis lateral and patellofemoral. She presents now for right Total Knee Arthroplasty.   Laboratory Data: Admission on 08/10/2012, Discharged on 08/12/2012  Component Date Value Range Status  . WBC 08/08/2012 7.7  4.0 - 10.5 K/uL Final  . RBC 08/08/2012 4.41  3.87 - 5.11 MIL/uL Final  . Hemoglobin 08/08/2012 13.3  12.0 - 15.0 g/dL Final  . HCT 08/65/7846 39.2  36.0 -  46.0 % Final  . MCV 08/08/2012 88.9  78.0 - 100.0 fL Final  . MCH 08/08/2012 30.2  26.0 - 34.0 pg Final  . MCHC 08/08/2012 33.9  30.0 - 36.0 g/dL Final  . RDW 96/29/5284 12.6  11.5 - 15.5 % Final  . Platelets 08/08/2012 250  150 - 400 K/uL Final  . Sodium 08/08/2012 138  135 - 145 mEq/L Final  . Potassium 08/08/2012 3.1* 3.5 - 5.1 mEq/L Final  . Chloride 08/08/2012 101  96 - 112 mEq/L Final  . CO2 08/08/2012 25  19 - 32 mEq/L Final  . Glucose, Bld 08/08/2012 124* 70 - 99 mg/dL Final  . BUN 13/24/4010 11  6 - 23 mg/dL Final  . Creatinine, Ser 08/08/2012 0.87  0.50 - 1.10 mg/dL Final  . Calcium 27/25/3664 9.5  8.4 - 10.5 mg/dL Final  . Total Protein 08/08/2012 7.2  6.0 - 8.3 g/dL Final  . Albumin 40/34/7425 3.5  3.5 - 5.2 g/dL Final  . AST 95/63/8756 24  0 - 37 U/L Final  . ALT 08/08/2012 15  0 - 35 U/L Final  . Alkaline Phosphatase 08/08/2012 89  39 - 117 U/L Final  . Total Bilirubin 08/08/2012 0.2* 0.3 - 1.2 mg/dL Final  . GFR calc non Af Amer 08/08/2012 71* >90 mL/min Final  . GFR calc Af Amer 08/08/2012 82* >90 mL/min Final   Comment:  The eGFR has been calculated                          using the CKD EPI equation.                          This calculation has not been                          validated in all clinical                          situations.                          eGFR's persistently                          <90 mL/min signify                          possible Chronic Kidney Disease.  Marland Kitchen Prothrombin Time 08/08/2012 13.6  11.6 - 15.2 seconds Final  . INR 08/08/2012 1.05  0.00 - 1.49 Final  . aPTT 08/08/2012 32  24 - 37 seconds Final  . Color, Urine 08/08/2012 YELLOW  YELLOW Final  . APPearance 08/08/2012 CLEAR  CLEAR Final  . Specific Gravity, Urine 08/08/2012 1.017  1.005 - 1.030 Final  . pH 08/08/2012 7.0  5.0 - 8.0 Final  . Glucose, UA 08/08/2012 NEGATIVE  NEGATIVE mg/dL Final  . Hgb urine dipstick 08/08/2012 NEGATIVE  NEGATIVE  Final  . Bilirubin Urine 08/08/2012 NEGATIVE  NEGATIVE Final  . Ketones, ur 08/08/2012 NEGATIVE  NEGATIVE mg/dL Final  . Protein, ur 40/98/1191 NEGATIVE  NEGATIVE mg/dL Final  . Urobilinogen, UA 08/08/2012 1.0  0.0 - 1.0 mg/dL Final  . Nitrite 47/82/9562 NEGATIVE  NEGATIVE Final  . Leukocytes, UA 08/08/2012 NEGATIVE  NEGATIVE Final   MICROSCOPIC NOT DONE ON URINES WITH NEGATIVE PROTEIN, BLOOD, LEUKOCYTES, NITRITE, OR GLUCOSE <1000 mg/dL.  . ABO/RH(D) 08/08/2012 O POS   Final  . Antibody Screen 08/08/2012 NEG   Final  . Sample Expiration 08/08/2012 08/13/2012   Final  . WBC 08/11/2012 10.4  4.0 - 10.5 K/uL Final  . RBC 08/11/2012 3.96  3.87 - 5.11 MIL/uL Final  . Hemoglobin 08/11/2012 11.9* 12.0 - 15.0 g/dL Final  . HCT 13/11/6576 35.7* 36.0 - 46.0 % Final  . MCV 08/11/2012 90.2  78.0 - 100.0 fL Final  . MCH 08/11/2012 30.1  26.0 - 34.0 pg Final  . MCHC 08/11/2012 33.3  30.0 - 36.0 g/dL Final  . RDW 46/96/2952 12.6  11.5 - 15.5 % Final  . Platelets 08/11/2012 208  150 - 400 K/uL Final  . Sodium 08/11/2012 140  135 - 145 mEq/L Final  . Potassium 08/11/2012 3.2* 3.5 - 5.1 mEq/L Final  . Chloride 08/11/2012 104  96 - 112 mEq/L Final  . CO2 08/11/2012 27  19 - 32 mEq/L Final  . Glucose, Bld 08/11/2012 124* 70 - 99 mg/dL Final  . BUN 84/13/2440 8  6 - 23 mg/dL Final  . Creatinine, Ser 08/11/2012 0.74  0.50 - 1.10 mg/dL Final  . Calcium 02/14/2535 8.6  8.4 - 10.5 mg/dL Final  . GFR calc non Af Amer 08/11/2012 >90  >90 mL/min Final  . GFR  calc Af Amer 08/11/2012 >90  >90 mL/min Final   Comment:                                 The eGFR has been calculated                          using the CKD EPI equation.                          This calculation has not been                          validated in all clinical                          situations.                          eGFR's persistently                          <90 mL/min signify                          possible Chronic Kidney  Disease.  . WBC 08/12/2012 13.3* 4.0 - 10.5 K/uL Final  . RBC 08/12/2012 3.67* 3.87 - 5.11 MIL/uL Final  . Hemoglobin 08/12/2012 11.0* 12.0 - 15.0 g/dL Final  . HCT 16/01/9603 33.0* 36.0 - 46.0 % Final  . MCV 08/12/2012 89.9  78.0 - 100.0 fL Final  . MCH 08/12/2012 30.0  26.0 - 34.0 pg Final  . MCHC 08/12/2012 33.3  30.0 - 36.0 g/dL Final  . RDW 54/12/8117 13.0  11.5 - 15.5 % Final  . Platelets 08/12/2012 201  150 - 400 K/uL Final  . Sodium 08/12/2012 140  135 - 145 mEq/L Final  . Potassium 08/12/2012 3.8  3.5 - 5.1 mEq/L Final  . Chloride 08/12/2012 107  96 - 112 mEq/L Final  . CO2 08/12/2012 27  19 - 32 mEq/L Final  . Glucose, Bld 08/12/2012 117* 70 - 99 mg/dL Final  . BUN 14/78/2956 9  6 - 23 mg/dL Final  . Creatinine, Ser 08/12/2012 0.83  0.50 - 1.10 mg/dL Final  . Calcium 21/30/8657 8.6  8.4 - 10.5 mg/dL Final  . GFR calc non Af Amer 08/12/2012 75* >90 mL/min Final  . GFR calc Af Amer 08/12/2012 87* >90 mL/min Final   Comment:                                 The eGFR has been calculated                          using the CKD EPI equation.                          This calculation has not been                          validated in all clinical  situations.                          eGFR's persistently                          <90 mL/min signify                          possible Chronic Kidney Disease.  Hospital Outpatient Visit on 08/08/2012  Component Date Value Range Status  . ABO/RH(D) 08/08/2012 O POS   Final  Hospital Outpatient Visit on 08/08/2012  Component Date Value Range Status  . MRSA, PCR 08/09/2012 NEGATIVE  NEGATIVE Final  . Staphylococcus aureus 08/09/2012 NEGATIVE  NEGATIVE Final   Comment:                                 The Xpert SA Assay (FDA                          approved for NASAL specimens                          in patients over 61 years of age),                          is one component of                          a  comprehensive surveillance                          program.  Test performance has                          been validated by [employer] for patients greater                          than or equal to 92 year old.                          It is not intended                          to diagnose infection nor to                          guide or monitor treatment.     X-Rays:Dg Chest 2 View  08/08/2012  *RADIOLOGY REPORT*  Clinical Data: Preop right total knee arthroplasty  CHEST - 2 VIEW  Comparison: None  Findings: The heart size and mediastinal contours are within normal limits.  Both lungs are clear.  The visualized skeletal structures are unremarkable.  IMPRESSION: Negative exam.   Original Report Authenticated By: Signa Kell, M.D.     EKG: Orders placed during the hospital encounter of 08/10/12  . EKG 12-LEAD  . EKG 12-LEAD  . EKG     Hospital Course: Vicki  B Krueger is a 62 y.o. who was admitted to [facility]. They were brought to the operating room on 08/10/2012 and underwent Procedure(s): TOTAL KNEE ARTHROPLASTY.  Patient tolerated the procedure well and was later transferred to the recovery room and then to the orthopaedic floor for postoperative care.  They were given PO and IV analgesics for pain control following their surgery.  They were given 24 hours of postoperative antibiotics of  Anti-infectives   Start     Dose/Rate Route Frequency Ordered Stop   08/10/12 1400  ceFAZolin (ANCEF) IVPB 2 g/50 mL premix     2 g 100 mL/hr over 30 Minutes Intravenous Every 6 hours 08/10/12 1306 08/10/12 2017   08/10/12 0600  ceFAZolin (ANCEF) IVPB 2 g/50 mL premix     2 g 100 mL/hr over 30 Minutes Intravenous On call to O.R. 08/10/12 0981 08/10/12 0715     and started on DVT prophylaxis in the form of Xarelto.   PT and OT were ordered for total joint protocol.  Discharge planning consulted to help with postop disposition and equipment needs.  Patient  had a good night on the evening of surgery.  They started to get up OOB with therapy on day one. Hemovac drain was pulled without difficulty.  Continued to work with therapy into day two.  Dressing was changed on day two and the incision was healing well.  Patient was seen in rounds and was ready to go home later on day two.   Discharge Medications: Prior to Admission medications   Medication Sig Start Date End Date Taking? Authorizing Provider  loratadine (CLARITIN) 10 MG tablet Take 10 mg by mouth daily.   Yes Historical Provider, MD  omeprazole (PRILOSEC) 20 MG capsule Take 20 mg by mouth daily.    Yes Historical Provider, MD  spironolactone (ALDACTONE) 25 MG tablet Take 50 mg by mouth every morning.    Yes Historical Provider, MD  topiramate (TOPAMAX) 50 MG tablet Take 50 mg by mouth 2 (two) times daily.   Yes Historical Provider, MD  valsartan-hydrochlorothiazide (DIOVAN-HCT) 160-25 MG per tablet Take 1 tablet by mouth every morning.    Yes Historical Provider, MD  methocarbamol (ROBAXIN) 500 MG tablet Take 1 tablet (500 mg total) by mouth every 6 (six) hours as needed. 08/11/12   Amber Tamala Ser, PA-C  oxyCODONE (OXY IR/ROXICODONE) 5 MG immediate release tablet Take 1-2 tablets (5-10 mg total) by mouth every 3 (three) hours as needed. 08/11/12   Amber Tamala Ser, PA-C  rivaroxaban (XARELTO) 10 MG TABS tablet Take 1 tablet (10 mg total) by mouth daily with breakfast. 08/11/12   Amber Tamala Ser, PA-C  traMADol (ULTRAM) 50 MG tablet Take 1-2 tablets (50-100 mg total) by mouth every 6 (six) hours as needed for pain. 08/11/12   Amber Tamala Ser, PA-C    Diet: Regular diet Activity:WBAT Follow-up:in 2 weeks Disposition - Home Discharged Condition: good   Discharge Orders   Future Appointments Provider Department Dept Phone   08/30/2012 8:45 AM [facility] Therapist [facility] [phone]   Future Orders Complete By Expires     Call  MD / Call 911  As directed     Comments:      If you experience chest pain or shortness of breath, CALL 911 and be transported to the hospital emergency room.  If you develope a fever above 101 F, pus (white drainage) or increased drainage or redness at the wound, or calf pain,  call your surgeon's office.    Change dressing  As directed     Comments:      Change dressing daily with sterile 4 x 4 inch gauze dressing and apply TED hose.    Constipation Prevention  As directed     Comments:      Drink plenty of fluids.  Prune juice may be helpful.  You may use a stool softener, such as Colace (over the counter) 100 mg twice a day.  Use MiraLax (over the counter) for constipation as needed.    Diet general  As directed     Do not put a pillow under the knee. Place it under the heel.  As directed     Driving restrictions  As directed     Comments:      No driving    Increase activity slowly as tolerated  As directed         Medication List    STOP taking these medications       amoxicillin 875 MG tablet  Commonly known as:  AMOXIL     calcium carbonate 600 MG Tabs  Commonly known as:  OS-CAL     cholecalciferol 400 UNITS Tabs  Commonly known as:  VITAMIN D     multivitamin tablet      TAKE these medications       loratadine 10 MG tablet  Commonly known as:  CLARITIN  Take 10 mg by mouth daily.     methocarbamol 500 MG tablet  Commonly known as:  ROBAXIN  Take 1 tablet (500 mg total) by mouth every 6 (six) hours as needed.     omeprazole 20 MG capsule  Commonly known as:  PRILOSEC  Take 20 mg by mouth daily.     oxyCODONE 5 MG immediate release tablet  Commonly known as:  Oxy IR/ROXICODONE  Take 1-2 tablets (5-10 mg total) by mouth every 3 (three) hours as needed.     rivaroxaban 10 MG Tabs tablet  Commonly known as:  XARELTO  Take 1 tablet (10 mg total) by mouth daily with breakfast.     spironolactone 25 MG tablet  Commonly known as:  ALDACTONE  Take 50 mg by  mouth every morning.     topiramate 50 MG tablet  Commonly known as:  TOPAMAX  Take 50 mg by mouth 2 (two) times daily.     traMADol 50 MG tablet  Commonly known as:  ULTRAM  Take 1-2 tablets (50-100 mg total) by mouth every 6 (six) hours as needed for pain.     valsartan-hydrochlorothiazide 160-25 MG per tablet  Commonly known as:  DIOVAN-HCT  Take 1 tablet by mouth every morning.           Follow-up Information   Follow up with Loanne Drilling, MD. Schedule an appointment as soon as possible for a visit on 08/25/2012. (Call [phone] Monday to make the appointment)    Contact information:   [address], [address] [place] [address] [phone]       Signed: Patrica Duel 08/25/2012, 10:38 AM

## 2012-08-30 ENCOUNTER — Ambulatory Visit: Payer: PRIVATE HEALTH INSURANCE | Attending: Orthopedic Surgery

## 2012-08-30 DIAGNOSIS — M25569 Pain in unspecified knee: Secondary | ICD-10-CM | POA: Insufficient documentation

## 2012-08-30 DIAGNOSIS — IMO0001 Reserved for inherently not codable concepts without codable children: Secondary | ICD-10-CM | POA: Insufficient documentation

## 2012-08-30 DIAGNOSIS — R269 Unspecified abnormalities of gait and mobility: Secondary | ICD-10-CM | POA: Insufficient documentation

## 2012-08-30 DIAGNOSIS — M25669 Stiffness of unspecified knee, not elsewhere classified: Secondary | ICD-10-CM | POA: Insufficient documentation

## 2012-09-02 ENCOUNTER — Ambulatory Visit: Payer: PRIVATE HEALTH INSURANCE | Admitting: Physical Therapy

## 2012-09-05 ENCOUNTER — Ambulatory Visit: Payer: PRIVATE HEALTH INSURANCE | Admitting: Rehabilitation

## 2012-09-07 ENCOUNTER — Ambulatory Visit: Payer: PRIVATE HEALTH INSURANCE | Admitting: Physical Therapy

## 2012-09-13 ENCOUNTER — Ambulatory Visit: Payer: PRIVATE HEALTH INSURANCE | Admitting: Physical Therapy

## 2012-09-14 ENCOUNTER — Ambulatory Visit: Payer: PRIVATE HEALTH INSURANCE | Admitting: Physical Therapy

## 2012-09-16 ENCOUNTER — Ambulatory Visit: Payer: PRIVATE HEALTH INSURANCE | Admitting: Rehabilitation

## 2012-09-20 ENCOUNTER — Ambulatory Visit: Payer: PRIVATE HEALTH INSURANCE | Attending: Orthopedic Surgery | Admitting: Rehabilitation

## 2012-09-20 DIAGNOSIS — IMO0001 Reserved for inherently not codable concepts without codable children: Secondary | ICD-10-CM | POA: Insufficient documentation

## 2012-09-20 DIAGNOSIS — M25669 Stiffness of unspecified knee, not elsewhere classified: Secondary | ICD-10-CM | POA: Insufficient documentation

## 2012-09-20 DIAGNOSIS — R269 Unspecified abnormalities of gait and mobility: Secondary | ICD-10-CM | POA: Insufficient documentation

## 2012-09-20 DIAGNOSIS — M25569 Pain in unspecified knee: Secondary | ICD-10-CM | POA: Insufficient documentation

## 2012-09-21 ENCOUNTER — Ambulatory Visit: Payer: PRIVATE HEALTH INSURANCE | Admitting: Physical Therapy

## 2012-09-22 ENCOUNTER — Ambulatory Visit: Payer: PRIVATE HEALTH INSURANCE

## 2012-09-26 ENCOUNTER — Ambulatory Visit: Payer: PRIVATE HEALTH INSURANCE | Admitting: Physical Therapy

## 2012-09-28 ENCOUNTER — Ambulatory Visit: Payer: PRIVATE HEALTH INSURANCE | Admitting: Physical Therapy

## 2012-09-29 ENCOUNTER — Ambulatory Visit: Payer: PRIVATE HEALTH INSURANCE | Admitting: Rehabilitation

## 2012-10-03 ENCOUNTER — Ambulatory Visit: Payer: PRIVATE HEALTH INSURANCE | Admitting: Physical Therapy

## 2012-10-05 ENCOUNTER — Ambulatory Visit: Payer: PRIVATE HEALTH INSURANCE | Admitting: Physical Therapy

## 2012-10-06 ENCOUNTER — Ambulatory Visit: Payer: PRIVATE HEALTH INSURANCE | Admitting: Physical Therapy

## 2012-10-10 ENCOUNTER — Ambulatory Visit: Payer: PRIVATE HEALTH INSURANCE | Admitting: Rehabilitation

## 2012-10-12 ENCOUNTER — Ambulatory Visit: Payer: PRIVATE HEALTH INSURANCE | Admitting: Rehabilitation

## 2012-10-17 ENCOUNTER — Ambulatory Visit: Payer: PRIVATE HEALTH INSURANCE | Admitting: Physical Therapy

## 2012-10-19 ENCOUNTER — Ambulatory Visit: Payer: PRIVATE HEALTH INSURANCE | Attending: Orthopedic Surgery | Admitting: Physical Therapy

## 2012-10-19 DIAGNOSIS — M25569 Pain in unspecified knee: Secondary | ICD-10-CM | POA: Insufficient documentation

## 2012-10-19 DIAGNOSIS — M25669 Stiffness of unspecified knee, not elsewhere classified: Secondary | ICD-10-CM | POA: Insufficient documentation

## 2012-10-19 DIAGNOSIS — R269 Unspecified abnormalities of gait and mobility: Secondary | ICD-10-CM | POA: Insufficient documentation

## 2012-10-19 DIAGNOSIS — IMO0001 Reserved for inherently not codable concepts without codable children: Secondary | ICD-10-CM | POA: Insufficient documentation

## 2012-10-24 ENCOUNTER — Ambulatory Visit: Payer: PRIVATE HEALTH INSURANCE | Admitting: Rehabilitation

## 2012-10-26 ENCOUNTER — Ambulatory Visit: Payer: PRIVATE HEALTH INSURANCE | Admitting: Rehabilitation

## 2012-10-31 ENCOUNTER — Ambulatory Visit: Payer: PRIVATE HEALTH INSURANCE | Admitting: Physical Therapy

## 2013-03-03 ENCOUNTER — Encounter: Payer: Self-pay | Admitting: Internal Medicine

## 2013-05-31 ENCOUNTER — Other Ambulatory Visit: Payer: Self-pay | Admitting: Gastroenterology

## 2013-07-24 ENCOUNTER — Encounter (HOSPITAL_COMMUNITY): Payer: Self-pay | Admitting: Pharmacy Technician

## 2013-07-27 ENCOUNTER — Encounter (HOSPITAL_COMMUNITY): Payer: Self-pay | Admitting: *Deleted

## 2013-07-27 DIAGNOSIS — R002 Palpitations: Secondary | ICD-10-CM

## 2013-07-27 HISTORY — DX: Palpitations: R00.2

## 2013-08-07 ENCOUNTER — Ambulatory Visit (HOSPITAL_COMMUNITY)
Admission: RE | Admit: 2013-08-07 | Discharge: 2013-08-07 | Disposition: A | Discharge status: Home / Self Care | Payer: 59 | Source: Ambulatory Visit | Attending: Internal Medicine | Admitting: Internal Medicine

## 2013-08-07 ENCOUNTER — Other Ambulatory Visit (HOSPITAL_COMMUNITY): Payer: Self-pay | Admitting: Internal Medicine

## 2013-08-07 DIAGNOSIS — M25519 Pain in unspecified shoulder: Secondary | ICD-10-CM | POA: Insufficient documentation

## 2013-08-15 ENCOUNTER — Encounter (HOSPITAL_COMMUNITY)
Admission: RE | Disposition: A | Discharge status: Home / Self Care | Payer: Self-pay | Source: Ambulatory Visit | Attending: Gastroenterology

## 2013-08-15 ENCOUNTER — Encounter (HOSPITAL_COMMUNITY): Payer: Self-pay | Admitting: Anesthesiology

## 2013-08-15 ENCOUNTER — Ambulatory Visit (HOSPITAL_COMMUNITY): Payer: 59 | Admitting: Anesthesiology

## 2013-08-15 ENCOUNTER — Ambulatory Visit (HOSPITAL_COMMUNITY)
Admission: RE | Admit: 2013-08-15 | Discharge: 2013-08-15 | Disposition: A | Discharge status: Home / Self Care | Payer: 59 | Source: Ambulatory Visit | Attending: Gastroenterology | Admitting: Gastroenterology

## 2013-08-15 ENCOUNTER — Encounter (HOSPITAL_COMMUNITY): Payer: 59 | Admitting: Anesthesiology

## 2013-08-15 DIAGNOSIS — M1990 Unspecified osteoarthritis, unspecified site: Secondary | ICD-10-CM | POA: Insufficient documentation

## 2013-08-15 DIAGNOSIS — K219 Gastro-esophageal reflux disease without esophagitis: Secondary | ICD-10-CM | POA: Insufficient documentation

## 2013-08-15 DIAGNOSIS — Z886 Allergy status to analgesic agent status: Secondary | ICD-10-CM | POA: Insufficient documentation

## 2013-08-15 DIAGNOSIS — E7800 Pure hypercholesterolemia, unspecified: Secondary | ICD-10-CM | POA: Insufficient documentation

## 2013-08-15 DIAGNOSIS — I10 Essential (primary) hypertension: Secondary | ICD-10-CM | POA: Insufficient documentation

## 2013-08-15 DIAGNOSIS — Z9079 Acquired absence of other genital organ(s): Secondary | ICD-10-CM | POA: Insufficient documentation

## 2013-08-15 DIAGNOSIS — Z9089 Acquired absence of other organs: Secondary | ICD-10-CM | POA: Insufficient documentation

## 2013-08-15 DIAGNOSIS — R1310 Dysphagia, unspecified: Secondary | ICD-10-CM | POA: Insufficient documentation

## 2013-08-15 DIAGNOSIS — Z96659 Presence of unspecified artificial knee joint: Secondary | ICD-10-CM | POA: Insufficient documentation

## 2013-08-15 DIAGNOSIS — Z1211 Encounter for screening for malignant neoplasm of colon: Secondary | ICD-10-CM | POA: Insufficient documentation

## 2013-08-15 HISTORY — PX: COLONOSCOPY WITH PROPOFOL: SHX5780

## 2013-08-15 HISTORY — PX: ESOPHAGOGASTRODUODENOSCOPY (EGD) WITH PROPOFOL: SHX5813

## 2013-08-15 HISTORY — DX: Other specified postprocedural states: Z98.890

## 2013-08-15 HISTORY — DX: Palpitations: R00.2

## 2013-08-15 HISTORY — DX: Esophageal obstruction: K22.2

## 2013-08-15 SURGERY — COLONOSCOPY WITH PROPOFOL
Anesthesia: Monitor Anesthesia Care

## 2013-08-15 MED ORDER — KETAMINE HCL 10 MG/ML IJ SOLN
INTRAMUSCULAR | Status: DC | PRN
Start: 2013-08-15 — End: 2013-08-15
  Administered 2013-08-15: 10 mg via INTRAVENOUS

## 2013-08-15 MED ORDER — PROPOFOL INFUSION 10 MG/ML OPTIME
INTRAVENOUS | Status: DC | PRN
  Administered 2013-08-15: 125 ug/kg/min via INTRAVENOUS

## 2013-08-15 MED ORDER — MIDAZOLAM HCL 5 MG/5ML IJ SOLN
INTRAMUSCULAR | Status: DC | PRN
  Administered 2013-08-15: 2 mg via INTRAVENOUS

## 2013-08-15 MED ORDER — LACTATED RINGERS IV SOLN
INTRAVENOUS | Status: DC
Start: 2013-08-15 — End: 2013-08-15
  Administered 2013-08-15: 1000 mL via INTRAVENOUS

## 2013-08-15 MED ORDER — PROPOFOL 10 MG/ML IV BOLUS
INTRAVENOUS | Status: AC
  Filled 2013-08-15: qty 20

## 2013-08-15 MED ORDER — ESMOLOL HCL 10 MG/ML IV SOLN
INTRAVENOUS | Status: DC | PRN
  Administered 2013-08-15 (×2): 20 mg via INTRAVENOUS

## 2013-08-15 MED ORDER — LACTATED RINGERS IV SOLN
INTRAVENOUS | Status: DC | PRN
  Administered 2013-08-15: 10:00:00 via INTRAVENOUS

## 2013-08-15 MED ORDER — BUTAMBEN-TETRACAINE-BENZOCAINE 2-2-14 % EX AERO
INHALATION_SPRAY | CUTANEOUS | Status: DC | PRN
  Administered 2013-08-15: 1 via TOPICAL

## 2013-08-15 MED ORDER — FENTANYL CITRATE 0.05 MG/ML IJ SOLN: INTRAMUSCULAR | Status: DC | PRN

## 2013-08-15 MED ORDER — ONDANSETRON HCL 4 MG/2ML IJ SOLN
INTRAMUSCULAR | Status: DC | PRN
  Administered 2013-08-15: 4 mg via INTRAVENOUS

## 2013-08-15 MED ORDER — MIDAZOLAM HCL 2 MG/2ML IJ SOLN
INTRAMUSCULAR | Status: AC
  Filled 2013-08-15: qty 2

## 2013-08-15 MED ORDER — SODIUM CHLORIDE 0.9 % IV SOLN: INTRAVENOUS | Status: DC

## 2013-08-15 MED ORDER — FENTANYL CITRATE 0.05 MG/ML IJ SOLN
INTRAMUSCULAR | Status: AC
  Filled 2013-08-15: qty 2

## 2013-08-15 MED ORDER — LACTATED RINGERS IV SOLN: INTRAVENOUS | Status: DC

## 2013-08-15 MED ORDER — ESMOLOL HCL 10 MG/ML IV SOLN
INTRAVENOUS | Status: AC
  Filled 2013-08-15: qty 10

## 2013-08-15 MED ORDER — ONDANSETRON HCL 4 MG/2ML IJ SOLN
INTRAMUSCULAR | Status: AC
  Filled 2013-08-15: qty 2

## 2013-08-15 MED ORDER — LIDOCAINE HCL (CARDIAC) 20 MG/ML IV SOLN
INTRAVENOUS | Status: AC
  Filled 2013-08-15: qty 5

## 2013-08-15 SURGICAL SUPPLY — 21 items

## 2013-08-15 NOTE — Anesthesia Preprocedure Evaluation (Signed)
Anesthesia Evaluation  Patient identified by MRN, date of birth, ID band Patient awake    Reviewed: Allergy & Precautions, H&P , NPO status , Patient's Chart, lab work & pertinent test results  Airway Mallampati: II TM Distance: >3 FB Neck ROM: Full    Dental no notable dental hx.    Pulmonary neg pulmonary ROS,  breath sounds clear to auscultation  Pulmonary exam normal       Cardiovascular Exercise Tolerance: Good hypertension, Pt. on medications and Pt. on home beta blockers Rhythm:Regular Rate:Normal     Neuro/Psych negative neurological ROS  negative psych ROS   GI/Hepatic hiatal hernia, GERD-  ,(+) Hepatitis -, C  Endo/Other  negative endocrine ROS  Renal/GU negative Renal ROS  negative genitourinary   Musculoskeletal negative musculoskeletal ROS (+)   Abdominal   Peds negative pediatric ROS (+)  Hematology negative hematology ROS (+)   Anesthesia Other Findings   Reproductive/Obstetrics negative OB ROS                           Anesthesia Physical Anesthesia Plan  ASA: II  Anesthesia Plan: MAC   Post-op Pain Management:    Induction: Intravenous  Airway Management Planned:   Additional Equipment:   Intra-op Plan:   Post-operative Plan:   Informed Consent: I have reviewed the patients History and Physical, chart, labs and discussed the procedure including the risks, benefits and alternatives for the proposed anesthesia with the patient or authorized representative who has indicated his/her understanding and acceptance.   Dental advisory given  Plan Discussed with: CRNA  Anesthesia Plan Comments:         Anesthesia Quick Evaluation

## 2013-08-15 NOTE — Anesthesia Postprocedure Evaluation (Signed)
  Anesthesia Post-op Note  Patient: Vicki Krueger  Procedure(s) Performed: Procedure(s) (LRB): COLONOSCOPY WITH PROPOFOL (N/A) ESOPHAGOGASTRODUODENOSCOPY (EGD) WITH PROPOFOL (N/A)  Patient Location: PACU  Anesthesia Type: MAC  Level of Consciousness: awake and alert   Airway and Oxygen Therapy: Patient Spontanous Breathing  Post-op Pain: mild  Post-op Assessment: Post-op Vital signs reviewed, Patient's Cardiovascular Status Stable, Respiratory Function Stable, Patent Airway and No signs of Nausea or vomiting  Last Vitals:  Filed Vitals:   08/15/13 1140  BP: 159/101  Pulse:   Temp:   Resp: 20    Post-op Vital Signs: stable   Complications: No apparent anesthesia complications

## 2013-08-15 NOTE — Transfer of Care (Signed)
Immediate Anesthesia Transfer of Care Note  Patient: Vicki Krueger  Procedure(s) Performed: Procedure(s): COLONOSCOPY WITH PROPOFOL (N/A) ESOPHAGOGASTRODUODENOSCOPY (EGD) WITH PROPOFOL (N/A)  Patient Location: PACU and Endoscopy Unit  Anesthesia Type:MAC  Level of Consciousness: awake, alert , oriented and patient cooperative  Airway & Oxygen Therapy: Patient connected to nasal cannula oxygen  Post-op Assessment: Post -op Vital signs reviewed and stable and Patient moving all extremities  Post vital signs: Reviewed and stable  Complications: No apparent anesthesia complications

## 2013-08-15 NOTE — H&P (Signed)
  Problems: Esophageal dysphagia. Screening colonoscopy.  History: The patient is a 63 year old female born 05/10/1946 were. She underwent a normal screening colonoscopy and diagnostic esophagogastroduodenoscopy on 07/13/2002. Intermittently, she experiences esophageal dysphagia.  The patient is scheduled to undergo a repeat screening colonoscopy and diagnostic esophagogastroduodenoscopy with possible esophageal dilation  Past medical history: Hypertension. Migraine headache syndrome. Gastroesophageal reflux. Osteoarthritis. Hypercholesterolemia. Ectopic pregnancy. Left oophorectomy. Cesarean section. Arthroscopic knee surgery. Cholecystectomy. Total right knee replacement surgery.  Medication allergies: Cafergot. Aspirin. Morphine.  Exam: The patient is alert and lying comfortably on the endoscopy stretcher. Lungs are clear to auscultation. Cardiac exam reveals a regular rhythm. Abdomen is soft and nontender to palpation  Plan: Proceed with diagnostic esophagogastroduodenoscopy with possible esophageal dilation and repeat screening colonoscopy

## 2013-08-15 NOTE — Op Note (Signed)
Problem: Esophageal dysphagia. Screening colonoscopy  Endoscopist: Danise EdgeMartin Nayelli Inglis  Premedication: Propofol administered by anesthesia  Procedure: Diagnostic esophagogastroduodenoscopy The patient was placed in the left lateral decubitus position. The Pentax gastroscope was passed through the posterior hypopharynx into the proximal esophagus without difficulty. The hypopharynx, larynx, and vocal cords appeared normal.  Esophagoscopy: The proximal, mid, and lower segments of the esophageal mucosa appeared normal. The squamocolumnar junction was regular in appearance and located 35 cm from the incisor teeth. There was no endoscopic evidence for the presence of erosive esophagitis, esophageal stricture formation, or esophageal obstruction. Five biopsies were performed along the length of the esophagus to look for eosinophilic esophagitis  Gastroscopy: Retroflex view of the gastric cardia and fundus was normal. The gastric body, antrum, and pylorus appeared normal.  Duodenoscopy: The duodenal bulb and descending duodenum appeared normal  Assessment: Normal gastroduodenoscopy. Random esophageal biopsies performed to look for eosinophilic esophagitis  Procedure: Screening colonoscopy Anal inspection and digital rectal exam were normal. The Pentax pediatric colonoscope was introduced into the rectum and advanced to the cecum. A normal-appearing appendiceal orifice and ileocecal valve were identified. Colonic preparation for the exam today was good  Rectum. Normal. Retroflex view of the distal rectum normal  Sigmoid colon and descending colon. Normal  Splenic flexure. Normal  Transverse colon. Normal  Hepatic flexure. Normal  Ascending colon. Normal  Cecum and ileocecal valve. Normal  Assessment: Normal screening proctocolonoscopy to the cecum.  Recommendation: Schedule repeat screening colonoscopy in 10 years.

## 2013-08-15 NOTE — Discharge Instructions (Signed)
Colonoscopy, Care After  Refer to this sheet in the next few weeks. These instructions provide you with information on caring for yourself after your procedure. Your health care provider may also give you more specific instructions. Your treatment has been planned according to current medical practices, but problems sometimes occur. Call your health care provider if you have any problems or questions after your procedure.  WHAT TO EXPECT AFTER THE PROCEDURE   After your procedure, it is typical to have the following:  · A small amount of blood in your stool.  · Moderate amounts of gas and mild abdominal cramping or bloating.  HOME CARE INSTRUCTIONS  · Do not drive, operate machinery, or sign important documents for 24 hours.  · You may shower and resume your regular physical activities, but move at a slower pace for the first 24 hours.  · Take frequent rest periods for the first 24 hours.  · Walk around or put a warm pack on your abdomen to help reduce abdominal cramping and bloating.  · Drink enough fluids to keep your urine clear or pale yellow.  · You may resume your normal diet as instructed by your health care provider. Avoid heavy or fried foods that are hard to digest.  · Avoid drinking alcohol for 24 hours or as instructed by your health care provider.  · Only take over-the-counter or prescription medicines as directed by your health care provider.  · If a tissue sample (biopsy) was taken during your procedure:  · Do not take aspirin or blood thinners for 7 days, or as instructed by your health care provider.  · Do not drink alcohol for 7 days, or as instructed by your health care provider.  · Eat soft foods for the first 24 hours.  SEEK MEDICAL CARE IF:  You have persistent spotting of blood in your stool 2 3 days after the procedure.  SEEK IMMEDIATE MEDICAL CARE IF:  · You have more than a small spotting of blood in your stool.  · You pass large blood clots in your stool.  · Your abdomen is swollen  (distended).  · You have nausea or vomiting.  · You have a fever.  · You have increasing abdominal pain that is not relieved with medicine.  Document Released: 11/19/2003 Document Revised: 01/25/2013 Document Reviewed: 12/12/2012  ExitCare® Patient Information ©2014 [employer].    Gastrointestinal Endoscopy  Care After  Refer to this sheet in the next few weeks. These instructions provide you with information on caring for yourself after your procedure. Your caregiver may also give you more specific instructions. Your treatment has been planned according to current medical practices, but problems sometimes occur. Call your caregiver if you have any problems or questions after your procedure.  HOME CARE INSTRUCTIONS  · If you were given medicine to help you relax (sedative), do not drive, operate machinery, or sign important documents for 24 hours.  · Avoid alcohol and hot or warm beverages for the first 24 hours after the procedure.  · Only take over-the-counter or prescription medicines for pain, discomfort, or fever as directed by your caregiver. You may resume taking your normal medicines unless your caregiver tells you otherwise. Ask your caregiver when you may resume taking medicines that may cause bleeding, such as aspirin, clopidogrel, or warfarin.  · You may return to your normal diet and activities on the day after your procedure, or as directed by your caregiver. Walking may help to reduce any bloated feeling   in your abdomen.  · Drink enough fluids to keep your urine clear or pale yellow.  · You may gargle with salt water if you have a sore throat.  SEEK IMMEDIATE MEDICAL CARE IF:  · You have severe nausea or vomiting.  · You have severe abdominal pain, abdominal cramps that last longer than 6 hours, or abdominal swelling (distention).  · You have severe shoulder or back pain.  · You have trouble swallowing.  · You have shortness of breath, your breathing is shallow, or you are breathing faster than  normal.  · You have a fever or a rapid heartbeat.  · You vomit blood or material that looks like coffee grounds.  · You have bloody, black, or tarry stools.  MAKE SURE YOU:  · Understand these instructions.  · Will watch your condition.  · Will get help right away if you are not doing well or get worse.  Document Released: 11/19/2003 Document Revised: 10/06/2011 Document Reviewed: 07/07/2011  ExitCare® Patient Information ©2014 [employer].

## 2013-08-16 ENCOUNTER — Encounter (HOSPITAL_COMMUNITY): Payer: Self-pay | Admitting: Gastroenterology

## 2013-09-25 ENCOUNTER — Ambulatory Visit (HOSPITAL_COMMUNITY)
Admission: RE | Admit: 2013-09-25 | Discharge: 2013-09-25 | Disposition: A | Discharge status: Home / Self Care | Payer: 59 | Source: Ambulatory Visit | Attending: Gastroenterology | Admitting: Gastroenterology

## 2013-09-25 ENCOUNTER — Encounter (HOSPITAL_COMMUNITY)
Admission: RE | Disposition: A | Discharge status: Home / Self Care | Payer: Self-pay | Source: Ambulatory Visit | Attending: Gastroenterology

## 2013-09-25 DIAGNOSIS — R05 Cough: Secondary | ICD-10-CM | POA: Insufficient documentation

## 2013-09-25 DIAGNOSIS — R1314 Dysphagia, pharyngoesophageal phase: Secondary | ICD-10-CM | POA: Insufficient documentation

## 2013-09-25 DIAGNOSIS — R059 Cough, unspecified: Secondary | ICD-10-CM | POA: Insufficient documentation

## 2013-09-25 HISTORY — PX: ESOPHAGEAL MANOMETRY: SHX5429

## 2013-09-25 SURGERY — MANOMETRY, ESOPHAGUS

## 2013-09-25 MED ORDER — LIDOCAINE VISCOUS 2 % MT SOLN
OROMUCOSAL | Status: AC
  Filled 2013-09-25: qty 15

## 2013-09-25 SURGICAL SUPPLY — 1 items: FACESHIELD LNG OPTICON STERILE (SAFETY) IMPLANT

## 2013-09-25 NOTE — H&P (Signed)
Problem: Esophageal dysphagia. Normal esophagogastroduodenoscopy and colonoscopy performed in April 2015.  History: The patient is a 63 year old female born 1951/02/23. The patient has intermittent esophageal dysphagia associated with a normal esophagogastroduodenoscopy with random esophageal biopsies performed in April 2015.  She is scheduled to undergo a diagnostic esophageal manometry.  Past medical history: Hypertension. Migraine headaches syndrome. Gastroesophageal reflux. Osteoarthritis. Chronic obstructive pulmonary disease. Hypercholesterolemia. Ectopic pregnancy. Laparotomies. Cesarean section. Arthroscopic left knee surgery on 2 occasions. Cholecystectomy. Total right knee replacement surgery.  Plan: Proceed with diagnostic high-resolution esophageal manometry to evaluate dysphagia.

## 2013-09-26 ENCOUNTER — Encounter (HOSPITAL_COMMUNITY): Payer: Self-pay | Admitting: Gastroenterology

## 2013-09-26 NOTE — Op Note (Signed)
Procedure: High-resolution esophageal manometry  Upper esophageal sphincter function: Normal basal and relaxation pressures  Lower esophageal sphincter function: Normal basal and relaxation pressures.  Esophageal body function: Normal peristaltic contractions  Assessment: Normal high-resolution esophageal manometry

## 2015-04-29 MED FILL — AMOXICILLIN 500 MG CAPSULE: 500 | 5 days supply | Qty: 15 | Fill #0

## 2015-04-29 MED FILL — traMADol HCL 50 MG TABS: 50 | 30 days supply | Qty: 90 | Fill #0

## 2015-04-29 MED FILL — SPIRONOLACTONE 25 MG TABLET: 25 | 90 days supply | Qty: 180 | Fill #3

## 2015-04-29 MED FILL — TOPIRAMATE 50 MG TABLET: 50 | 90 days supply | Qty: 180 | Fill #3

## 2015-07-22 MED FILL — OMEPRAZOLE DR 20 MG CAPSULE: 20 | 60 days supply | Qty: 120 | Fill #0

## 2015-07-25 MED FILL — traMADol HCL 50 MG TABS: 50 | 30 days supply | Qty: 90 | Fill #0

## 2015-07-26 MED FILL — TOPIRAMATE 50 MG TABLET: 50 | 90 days supply | Qty: 180 | Fill #0

## 2015-07-26 MED FILL — SPIRONOLACTONE 25 MG TABLET: 25 | 90 days supply | Qty: 180 | Fill #0

## 2015-09-16 DIAGNOSIS — H524 Presbyopia: Secondary | ICD-10-CM | POA: Diagnosis not present

## 2015-10-02 DIAGNOSIS — E782 Mixed hyperlipidemia: Secondary | ICD-10-CM | POA: Diagnosis not present

## 2015-10-02 DIAGNOSIS — M545 Low back pain: Secondary | ICD-10-CM | POA: Diagnosis not present

## 2015-10-02 DIAGNOSIS — K219 Gastro-esophageal reflux disease without esophagitis: Secondary | ICD-10-CM | POA: Diagnosis not present

## 2015-10-02 DIAGNOSIS — I10 Essential (primary) hypertension: Secondary | ICD-10-CM | POA: Diagnosis not present

## 2015-10-02 DIAGNOSIS — J309 Allergic rhinitis, unspecified: Secondary | ICD-10-CM | POA: Diagnosis not present

## 2015-10-02 DIAGNOSIS — Z79899 Other long term (current) drug therapy: Secondary | ICD-10-CM | POA: Diagnosis not present

## 2015-10-02 DIAGNOSIS — E6609 Other obesity due to excess calories: Secondary | ICD-10-CM | POA: Diagnosis not present

## 2015-10-02 DIAGNOSIS — E559 Vitamin D deficiency, unspecified: Secondary | ICD-10-CM | POA: Diagnosis not present

## 2015-10-02 DIAGNOSIS — G43919 Migraine, unspecified, intractable, without status migrainosus: Secondary | ICD-10-CM | POA: Diagnosis not present

## 2015-10-02 MED FILL — traMADol HCL 50 MG TABS: 50 | 30 days supply | Qty: 90 | Fill #0

## 2015-10-02 MED FILL — PHENTERMINE 15 MG CAPSULE: 15 | 30 days supply | Qty: 30 | Fill #0

## 2015-10-24 MED FILL — SPIRONOLACTONE 25 MG TABLET: 25 | 90 days supply | Qty: 180 | Fill #1

## 2015-10-24 MED FILL — CYCLOBENZAPRINE 10 MG TAB: 10 | 30 days supply | Qty: 30 | Fill #0

## 2015-10-24 MED FILL — TOPIRAMATE 50 MG TABLET: 50 | 90 days supply | Qty: 180 | Fill #1

## 2015-10-24 MED FILL — OMEPRAZOLE DR 20 MG CAPSULE: 20 | 60 days supply | Qty: 120 | Fill #1

## 2015-11-05 MED FILL — traMADol HCL 50 MG TABS: 50 | 30 days supply | Qty: 90 | Fill #1

## 2015-11-19 MED FILL — AMOXICILLIN 500 MG CAPSULE: 500 | 2 days supply | Qty: 8 | Fill #0

## 2015-11-21 MED FILL — ACETAMINOPHEN/COD #3 TABLET: 300-30 | 4 days supply | Qty: 20 | Fill #0

## 2015-11-21 MED FILL — AMOXICILLIN 500 MG CAPSULE: 500 | 2 days supply | Qty: 8 | Fill #1

## 2016-01-03 MED FILL — traMADol HCL 50 MG TABS: 50 | 30 days supply | Qty: 90 | Fill #0

## 2016-01-27 MED FILL — OMEPRAZOLE 20 MG CAPSULE DR: 20 | 60 days supply | Qty: 120 | Fill #2

## 2016-01-27 MED FILL — SPIRONOLACTONE 25 MG TABLET: 25 | 90 days supply | Qty: 180 | Fill #2

## 2016-01-27 MED FILL — TOPIRAMATE 50 MG TABLET: 50 | 90 days supply | Qty: 180 | Fill #2

## 2016-02-11 MED FILL — traMADol HCL 50 MG TABS: 50 | 30 days supply | Qty: 90 | Fill #1

## 2016-03-23 MED FILL — AMOXICILLIN 500 MG CAPSULE: 500 | 2 days supply | Qty: 8 | Fill #2

## 2016-03-23 MED FILL — CYCLOBENZAPRINE 10 MG TAB: 10 | 30 days supply | Qty: 30 | Fill #1

## 2016-03-24 MED FILL — traMADol HCL 50 MG TABS: 50 | 30 days supply | Qty: 90 | Fill #0

## 2016-04-27 MED FILL — TOPIRAMATE 50 MG TABLET: 50 | 90 days supply | Qty: 180 | Fill #0

## 2016-04-27 MED FILL — SPIRONOLACTONE 25 MG TABLET: 25 | 90 days supply | Qty: 180 | Fill #0

## 2016-04-27 MED FILL — AMOXICILLIN 500 MG CAPSULE: 500 | 2 days supply | Qty: 8 | Fill #3

## 2016-05-01 DIAGNOSIS — J209 Acute bronchitis, unspecified: Secondary | ICD-10-CM | POA: Diagnosis not present

## 2016-05-01 DIAGNOSIS — J309 Allergic rhinitis, unspecified: Secondary | ICD-10-CM | POA: Diagnosis not present

## 2016-05-01 DIAGNOSIS — J0100 Acute maxillary sinusitis, unspecified: Secondary | ICD-10-CM | POA: Diagnosis not present

## 2016-05-01 MED FILL — AZITHROMYCIN 500 MG TABLET: 500 | 7 days supply | Qty: 7 | Fill #0

## 2016-05-05 MED FILL — traMADol HCL 50 MG TABS: 50 | 30 days supply | Qty: 90 | Fill #1

## 2016-05-22 DIAGNOSIS — Z0000 Encounter for general adult medical examination without abnormal findings: Secondary | ICD-10-CM | POA: Diagnosis not present

## 2016-05-22 DIAGNOSIS — K219 Gastro-esophageal reflux disease without esophagitis: Secondary | ICD-10-CM | POA: Diagnosis not present

## 2016-05-22 DIAGNOSIS — E559 Vitamin D deficiency, unspecified: Secondary | ICD-10-CM | POA: Diagnosis not present

## 2016-05-22 DIAGNOSIS — E6609 Other obesity due to excess calories: Secondary | ICD-10-CM | POA: Diagnosis not present

## 2016-05-22 DIAGNOSIS — J309 Allergic rhinitis, unspecified: Secondary | ICD-10-CM | POA: Diagnosis not present

## 2016-05-22 DIAGNOSIS — Z79899 Other long term (current) drug therapy: Secondary | ICD-10-CM | POA: Diagnosis not present

## 2016-05-22 DIAGNOSIS — E782 Mixed hyperlipidemia: Secondary | ICD-10-CM | POA: Diagnosis not present

## 2016-05-22 DIAGNOSIS — M189 Osteoarthritis of first carpometacarpal joint, unspecified: Secondary | ICD-10-CM | POA: Diagnosis not present

## 2016-05-22 DIAGNOSIS — I10 Essential (primary) hypertension: Secondary | ICD-10-CM | POA: Diagnosis not present

## 2016-05-22 DIAGNOSIS — G43919 Migraine, unspecified, intractable, without status migrainosus: Secondary | ICD-10-CM | POA: Diagnosis not present

## 2016-06-11 MED FILL — OMEPRAZOLE 20 MG CAPSULE DR: 20 | 60 days supply | Qty: 120 | Fill #3

## 2016-06-11 MED FILL — traMADol HCL 50 MG TABS: 50 | 30 days supply | Qty: 90 | Fill #0

## 2016-07-23 MED FILL — SPIRONOLACTONE 25 MG TABLET: 25 | 90 days supply | Qty: 180 | Fill #1

## 2016-07-23 MED FILL — TOPIRAMATE 50 MG TABLET: 50 | 90 days supply | Qty: 180 | Fill #1

## 2016-07-23 MED FILL — traMADol HCL 50 MG TABS: 50 | 30 days supply | Qty: 90 | Fill #1

## 2016-07-23 MED FILL — AMOXICILLIN 500 MG CAPSULE: 500 | 2 days supply | Qty: 8 | Fill #4

## 2016-07-31 DIAGNOSIS — Z471 Aftercare following joint replacement surgery: Secondary | ICD-10-CM | POA: Diagnosis not present

## 2016-07-31 DIAGNOSIS — Z96651 Presence of right artificial knee joint: Secondary | ICD-10-CM | POA: Diagnosis not present

## 2016-08-07 ENCOUNTER — Other Ambulatory Visit: Payer: Self-pay | Admitting: Urology

## 2016-08-07 DIAGNOSIS — R112 Nausea with vomiting, unspecified: Secondary | ICD-10-CM

## 2016-08-07 MED ORDER — PROMETHAZINE HCL 25 MG RE SUPP: 25.0000 mg | Freq: Four times a day (QID) | RECTAL | 0 refills | Status: DC | PRN

## 2016-08-07 MED ORDER — PROMETHAZINE HCL 25 MG PO TABS: 25.0000 mg | ORAL_TABLET | Freq: Four times a day (QID) | ORAL | 0 refills | Status: DC | PRN

## 2016-08-07 MED FILL — PHENADOZ 25 MG SUPP: 25 | 3 days supply | Qty: 12 | Fill #0

## 2016-08-07 MED FILL — PROMETHAZINE 25 MG TABLET: 25 | 7 days supply | Qty: 30 | Fill #0

## 2016-08-07 NOTE — Progress Notes (Signed)
Vicki Krueger reports new onset of N/V/D last evening and has persisted.  She denies fever or chills, SOB, chest pain or productive cough.   She has not had any recent exposures to illness to her knowledge.  She has not recently eaten in a restaurant or been exposed to under cooked food to her knowledge.  Advised that I will send Escript for phenergan tablets and suppositories to use prn N/V.  She understands to use either the tablets or the suppositories but not in combination.  She will use suppositories if unable to keep down oral medications.  Escript sent.  Advised to follow up with her PCP if sxs persist or worsen or should she develop fever or new sxs.  She states her understanding and comliance.   Marguarite Arbour, PA-C

## 2016-09-03 MED FILL — traMADol HCL 50 MG TABS: 50 | 30 days supply | Qty: 90 | Fill #0

## 2016-10-14 MED FILL — SPIRONOLACTONE 25 MG TABLET: 25 | 90 days supply | Qty: 180 | Fill #2

## 2016-10-14 MED FILL — TOPIRAMATE 50 MG TABLET: 50 | 90 days supply | Qty: 180 | Fill #2

## 2016-10-14 MED FILL — traMADol HCL 50 MG TABS: 50 | 30 days supply | Qty: 90 | Fill #1

## 2016-10-15 MED FILL — OMEPRAZOLE 20 MG CAPSULE DR: 20 | 60 days supply | Qty: 120 | Fill #0

## 2016-10-15 MED FILL — PHENTERMINE 15 MG CAPSULE: 15 | 30 days supply | Qty: 30 | Fill #0

## 2016-10-16 MED FILL — AMOXICILLIN 500 MG CAPSULE: 500 | 2 days supply | Qty: 8 | Fill #5

## 2016-11-20 MED FILL — traMADol HCL 50 MG TABS: 50 | 30 days supply | Qty: 90 | Fill #0

## 2016-12-31 MED FILL — traMADol HCL 50 MG TABS: 50 | 30 days supply | Qty: 90 | Fill #1

## 2017-01-20 MED FILL — SPIRONOLACTONE 25 MG TABLET: 25 | 90 days supply | Qty: 180 | Fill #3

## 2017-01-20 MED FILL — TOPIRAMATE 50 MG TABLET: 50 | 90 days supply | Qty: 180 | Fill #3

## 2017-01-20 MED FILL — OMEPRAZOLE 20 MG CAP: 20 | 60 days supply | Qty: 120 | Fill #1

## 2017-02-26 MED FILL — traMADol HCL 50 MG TABS: 50 | 30 days supply | Qty: 90 | Fill #2

## 2017-03-01 MED FILL — AMOXICILLIN 500 MG CAPSULE: 500 | 2 days supply | Qty: 8 | Fill #0

## 2017-04-15 MED FILL — traMADol HCL 50 MG TABS: 50 | 30 days supply | Qty: 90 | Fill #0

## 2017-04-21 MED FILL — SPIRONOLACTONE 25 MG TABLET: 25 | 90 days supply | Qty: 180 | Fill #0

## 2017-04-21 MED FILL — AMOXICILLIN 500 MG CAPSULE: 500 | 2 days supply | Qty: 8 | Fill #1

## 2017-04-21 MED FILL — TOPIRAMATE 50 MG TABLET: 50 | 90 days supply | Qty: 180 | Fill #0

## 2017-05-31 DIAGNOSIS — G43919 Migraine, unspecified, intractable, without status migrainosus: Secondary | ICD-10-CM | POA: Diagnosis not present

## 2017-05-31 DIAGNOSIS — Z79899 Other long term (current) drug therapy: Secondary | ICD-10-CM | POA: Diagnosis not present

## 2017-05-31 DIAGNOSIS — Z23 Encounter for immunization: Secondary | ICD-10-CM | POA: Diagnosis not present

## 2017-05-31 DIAGNOSIS — Z0000 Encounter for general adult medical examination without abnormal findings: Secondary | ICD-10-CM | POA: Diagnosis not present

## 2017-05-31 DIAGNOSIS — E559 Vitamin D deficiency, unspecified: Secondary | ICD-10-CM | POA: Diagnosis not present

## 2017-05-31 DIAGNOSIS — J309 Allergic rhinitis, unspecified: Secondary | ICD-10-CM | POA: Diagnosis not present

## 2017-05-31 DIAGNOSIS — K219 Gastro-esophageal reflux disease without esophagitis: Secondary | ICD-10-CM | POA: Diagnosis not present

## 2017-05-31 DIAGNOSIS — I10 Essential (primary) hypertension: Secondary | ICD-10-CM | POA: Diagnosis not present

## 2017-05-31 DIAGNOSIS — M189 Osteoarthritis of first carpometacarpal joint, unspecified: Secondary | ICD-10-CM | POA: Diagnosis not present

## 2017-05-31 DIAGNOSIS — E6609 Other obesity due to excess calories: Secondary | ICD-10-CM | POA: Diagnosis not present

## 2017-05-31 DIAGNOSIS — E782 Mixed hyperlipidemia: Secondary | ICD-10-CM | POA: Diagnosis not present

## 2017-05-31 MED FILL — AMOXICILLIN 500 MG CAPSULE: 500 | 2 days supply | Qty: 8 | Fill #2

## 2017-05-31 MED FILL — OMEPRAZOLE 20 MG CAP: 20 | 60 days supply | Qty: 120 | Fill #2

## 2017-06-01 MED FILL — traMADol HCL 50 MG TABS: 50 | 30 days supply | Qty: 90 | Fill #0

## 2017-07-16 MED FILL — traMADol HCL 50 MG TABS: 50 | 30 days supply | Qty: 90 | Fill #1

## 2017-07-23 MED FILL — SPIRONOLACTONE 25 MG TABLET: 25 | 90 days supply | Qty: 180 | Fill #0

## 2017-07-23 MED FILL — TOPIRAMATE 50 MG TABLET: 50 | 90 days supply | Qty: 180 | Fill #0

## 2017-07-26 MED FILL — AMOXICILLIN 500 MG CAPSULE: 500 | 2 days supply | Qty: 8 | Fill #3

## 2017-08-27 MED FILL — AMOXICILLIN 500 MG CAPSULE: 500 | 2 days supply | Qty: 8 | Fill #4

## 2017-08-27 MED FILL — traMADol HCL 50 MG TABS: 50 | 30 days supply | Qty: 90 | Fill #2

## 2017-10-14 MED FILL — CYCLOBENZAPRINE 10 MG TAB: 10 | 30 days supply | Qty: 30 | Fill #0

## 2017-10-18 MED FILL — SPIRONOLACTONE 25 MG TABLET: 25 | 90 days supply | Qty: 180 | Fill #0

## 2017-10-18 MED FILL — OMEPRAZOLE 20 MG CAP: 20 | 90 days supply | Qty: 180 | Fill #0

## 2017-10-18 MED FILL — TOPIRAMATE 50 MG TABLET: 50 | 90 days supply | Qty: 180 | Fill #1

## 2017-10-22 ENCOUNTER — Ambulatory Visit
Admission: RE | Admit: 2017-10-22 | Discharge: 2017-10-22 | Disposition: A | Discharge status: Home / Self Care | Payer: 59 | Source: Ambulatory Visit | Attending: Internal Medicine | Admitting: Internal Medicine

## 2017-10-22 ENCOUNTER — Other Ambulatory Visit: Payer: Self-pay | Admitting: Obstetrics and Gynecology

## 2017-10-22 ENCOUNTER — Other Ambulatory Visit: Payer: Self-pay | Admitting: Internal Medicine

## 2017-10-22 DIAGNOSIS — Z1231 Encounter for screening mammogram for malignant neoplasm of breast: Secondary | ICD-10-CM

## 2017-10-22 MED FILL — traMADol HCL 50 MG TABS: 50 | 30 days supply | Qty: 90 | Fill #0

## 2017-10-25 MED FILL — AMOXICILLIN 500 MG CAPSULE: 500 | 2 days supply | Qty: 8 | Fill #5

## 2017-11-12 DIAGNOSIS — Z6841 Body Mass Index (BMI) 40.0 and over, adult: Secondary | ICD-10-CM | POA: Diagnosis not present

## 2017-11-12 DIAGNOSIS — Z124 Encounter for screening for malignant neoplasm of cervix: Secondary | ICD-10-CM | POA: Diagnosis not present

## 2017-11-12 DIAGNOSIS — Z01419 Encounter for gynecological examination (general) (routine) without abnormal findings: Secondary | ICD-10-CM | POA: Diagnosis not present

## 2017-11-12 DIAGNOSIS — R109 Unspecified abdominal pain: Secondary | ICD-10-CM | POA: Diagnosis not present

## 2017-12-07 MED FILL — traMADol HCL 50 MG TABS: 50 | 30 days supply | Qty: 90 | Fill #1

## 2017-12-07 MED FILL — AMOXICILLIN 500 MG CAPSULE: 500 | 2 days supply | Qty: 8 | Fill #6

## 2018-01-10 MED FILL — traMADol HCL 50 MG TABS: 50 | 30 days supply | Qty: 90 | Fill #2

## 2018-01-17 MED FILL — SPIRONOLACTONE 25 MG TABS: 25 | 90 days supply | Qty: 180 | Fill #1

## 2018-01-17 MED FILL — TOPIRAMATE 50 MG TABLET: 50 | 90 days supply | Qty: 180 | Fill #2

## 2018-02-24 MED FILL — traMADol HCL 50 MG TABS: 50 | 30 days supply | Qty: 90 | Fill #0

## 2018-03-07 ENCOUNTER — Emergency Department (HOSPITAL_COMMUNITY)
Admission: EM | Admit: 2018-03-07 | Discharge: 2018-03-07 | Disposition: A | Discharge status: Home / Self Care | Payer: 59 | Attending: Emergency Medicine | Admitting: Emergency Medicine

## 2018-03-07 ENCOUNTER — Encounter (HOSPITAL_COMMUNITY): Payer: Self-pay | Admitting: *Deleted

## 2018-03-07 ENCOUNTER — Emergency Department (HOSPITAL_COMMUNITY): Payer: 59

## 2018-03-07 ENCOUNTER — Other Ambulatory Visit: Payer: Self-pay

## 2018-03-07 DIAGNOSIS — I10 Essential (primary) hypertension: Secondary | ICD-10-CM | POA: Diagnosis not present

## 2018-03-07 DIAGNOSIS — R0789 Other chest pain: Secondary | ICD-10-CM | POA: Diagnosis not present

## 2018-03-07 DIAGNOSIS — Z79899 Other long term (current) drug therapy: Secondary | ICD-10-CM | POA: Diagnosis not present

## 2018-03-07 DIAGNOSIS — S0990XA Unspecified injury of head, initial encounter: Secondary | ICD-10-CM | POA: Diagnosis not present

## 2018-03-07 DIAGNOSIS — Z96651 Presence of right artificial knee joint: Secondary | ICD-10-CM | POA: Diagnosis not present

## 2018-03-07 DIAGNOSIS — Y9389 Activity, other specified: Secondary | ICD-10-CM | POA: Insufficient documentation

## 2018-03-07 DIAGNOSIS — S4991XA Unspecified injury of right shoulder and upper arm, initial encounter: Secondary | ICD-10-CM | POA: Diagnosis not present

## 2018-03-07 DIAGNOSIS — Y999 Unspecified external cause status: Secondary | ICD-10-CM | POA: Diagnosis not present

## 2018-03-07 DIAGNOSIS — R109 Unspecified abdominal pain: Secondary | ICD-10-CM | POA: Diagnosis not present

## 2018-03-07 DIAGNOSIS — R000 Tachycardia, unspecified: Secondary | ICD-10-CM | POA: Diagnosis not present

## 2018-03-07 DIAGNOSIS — S299XXA Unspecified injury of thorax, initial encounter: Secondary | ICD-10-CM | POA: Diagnosis not present

## 2018-03-07 DIAGNOSIS — Y92410 Unspecified street and highway as the place of occurrence of the external cause: Secondary | ICD-10-CM | POA: Insufficient documentation

## 2018-03-07 DIAGNOSIS — S161XXA Strain of muscle, fascia and tendon at neck level, initial encounter: Secondary | ICD-10-CM | POA: Diagnosis not present

## 2018-03-07 DIAGNOSIS — S3991XA Unspecified injury of abdomen, initial encounter: Secondary | ICD-10-CM | POA: Diagnosis not present

## 2018-03-07 DIAGNOSIS — S199XXA Unspecified injury of neck, initial encounter: Secondary | ICD-10-CM | POA: Diagnosis not present

## 2018-03-07 DIAGNOSIS — M25511 Pain in right shoulder: Secondary | ICD-10-CM | POA: Diagnosis not present

## 2018-03-07 LAB — COMPREHENSIVE METABOLIC PANEL
ALT: 25 U/L (ref 0–44)
AST: 36 U/L (ref 15–41)
Albumin: 4.1 g/dL (ref 3.5–5.0)
Alkaline Phosphatase: 84 U/L (ref 38–126)
Anion gap: 8 (ref 5–15)
BILIRUBIN TOTAL: 0.6 mg/dL (ref 0.3–1.2)
BUN: 16 mg/dL (ref 8–23)
CO2: 22 mmol/L (ref 22–32)
Calcium: 9.2 mg/dL (ref 8.9–10.3)
Chloride: 110 mmol/L (ref 98–111)
Creatinine, Ser: 0.97 mg/dL (ref 0.44–1.00)
GFR, EST NON AFRICAN AMERICAN: 60 mL/min — AB (ref >60)
GLUCOSE: 116 mg/dL — AB (ref 70–99)
Potassium: 3.3 mmol/L — ABNORMAL LOW (ref 3.5–5.1)
Sodium: 140 mmol/L (ref 135–145)
Total Protein: 7.5 g/dL (ref 6.5–8.1)

## 2018-03-07 LAB — CBC WITH DIFFERENTIAL/PLATELET
ABS IMMATURE GRANULOCYTES: 0.08 10*3/uL — AB (ref 0.00–0.07)
Basophils Absolute: 0 10*3/uL (ref 0.0–0.1)
Basophils Relative: 0 %
Eosinophils Absolute: 0.1 10*3/uL (ref 0.0–0.5)
Eosinophils Relative: 1 %
HEMATOCRIT: 44.3 % (ref 36.0–46.0)
Hemoglobin: 14.3 g/dL (ref 12.0–15.0)
IMMATURE GRANULOCYTES: 1 %
LYMPHS ABS: 2.1 10*3/uL (ref 0.7–4.0)
Lymphocytes Relative: 22 %
MCH: 30.4 pg (ref 26.0–34.0)
MCHC: 32.3 g/dL (ref 30.0–36.0)
MCV: 94.3 fL (ref 80.0–100.0)
MONO ABS: 0.8 10*3/uL (ref 0.1–1.0)
MONOS PCT: 8 %
NEUTROS PCT: 68 %
Neutro Abs: 6.6 10*3/uL (ref 1.7–7.7)
Platelets: 210 10*3/uL (ref 150–400)
RBC: 4.7 MIL/uL (ref 3.87–5.11)
RDW: 12.7 % (ref 11.5–15.5)
WBC: 9.6 10*3/uL (ref 4.0–10.5)
nRBC: 0 % (ref 0.0–0.2)

## 2018-03-07 LAB — I-STAT CHEM 8, ED
BUN: 17 mg/dL (ref 8–23)
CHLORIDE: 110 mmol/L (ref 98–111)
Calcium, Ion: 1.19 mmol/L (ref 1.15–1.40)
Creatinine, Ser: 0.9 mg/dL (ref 0.44–1.00)
GLUCOSE: 117 mg/dL — AB (ref 70–99)
HCT: 44 % (ref 36.0–46.0)
Hemoglobin: 15 g/dL (ref 12.0–15.0)
Potassium: 3.5 mmol/L (ref 3.5–5.1)
Sodium: 141 mmol/L (ref 135–145)
TCO2: 24 mmol/L (ref 22–32)

## 2018-03-07 MED ORDER — METHOCARBAMOL 500 MG PO TABS: 500.0000 mg | ORAL_TABLET | Freq: Three times a day (TID) | ORAL | 0 refills | Status: DC | PRN

## 2018-03-07 MED ORDER — METHOCARBAMOL 500 MG PO TABS
500.0000 mg | ORAL_TABLET | Freq: Once | ORAL | Status: AC
  Administered 2018-03-07: 500 mg via ORAL
  Filled 2018-03-07: qty 1

## 2018-03-07 MED ORDER — IOPAMIDOL (ISOVUE-300) INJECTION 61%
100.0000 mL | Freq: Once | INTRAVENOUS | Status: AC | PRN
  Administered 2018-03-07: 100 mL via INTRAVENOUS
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
## 2018-03-07 NOTE — Discharge Instructions (Signed)
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
## 2018-03-07 NOTE — ED Notes (Signed)
Pt ambulatory to waiting room. Pt verbalized understanding of discharge instructions.   

## 2018-03-10 DIAGNOSIS — M9905 Segmental and somatic dysfunction of pelvic region: Secondary | ICD-10-CM | POA: Diagnosis not present

## 2018-03-10 DIAGNOSIS — M9901 Segmental and somatic dysfunction of cervical region: Secondary | ICD-10-CM | POA: Diagnosis not present

## 2018-03-10 DIAGNOSIS — M9902 Segmental and somatic dysfunction of thoracic region: Secondary | ICD-10-CM | POA: Diagnosis not present

## 2018-03-10 DIAGNOSIS — M542 Cervicalgia: Secondary | ICD-10-CM | POA: Diagnosis not present

## 2018-03-10 DIAGNOSIS — M9903 Segmental and somatic dysfunction of lumbar region: Secondary | ICD-10-CM | POA: Diagnosis not present

## 2018-03-11 MED FILL — CYCLOBENZAPRINE HCL 10 MG T: 10 | 30 days supply | Qty: 30 | Fill #1

## 2018-03-11 MED FILL — FUROSEMIDE 20 MG TABS: 20 | 30 days supply | Qty: 30 | Fill #0

## 2018-03-14 ENCOUNTER — Emergency Department (HOSPITAL_BASED_OUTPATIENT_CLINIC_OR_DEPARTMENT_OTHER)
Admission: EM | Admit: 2018-03-14 | Discharge: 2018-03-14 | Disposition: A | Discharge status: Home / Self Care | Payer: 59 | Attending: Emergency Medicine | Admitting: Emergency Medicine

## 2018-03-14 ENCOUNTER — Encounter (HOSPITAL_BASED_OUTPATIENT_CLINIC_OR_DEPARTMENT_OTHER): Payer: Self-pay

## 2018-03-14 ENCOUNTER — Emergency Department (HOSPITAL_BASED_OUTPATIENT_CLINIC_OR_DEPARTMENT_OTHER): Payer: 59

## 2018-03-14 ENCOUNTER — Other Ambulatory Visit: Payer: Self-pay

## 2018-03-14 DIAGNOSIS — S66911A Strain of unspecified muscle, fascia and tendon at wrist and hand level, right hand, initial encounter: Secondary | ICD-10-CM | POA: Insufficient documentation

## 2018-03-14 DIAGNOSIS — S86911A Strain of unspecified muscle(s) and tendon(s) at lower leg level, right leg, initial encounter: Secondary | ICD-10-CM | POA: Diagnosis not present

## 2018-03-14 DIAGNOSIS — Y9389 Activity, other specified: Secondary | ICD-10-CM | POA: Diagnosis not present

## 2018-03-14 DIAGNOSIS — T148XXA Other injury of unspecified body region, initial encounter: Secondary | ICD-10-CM

## 2018-03-14 DIAGNOSIS — Y998 Other external cause status: Secondary | ICD-10-CM | POA: Diagnosis not present

## 2018-03-14 DIAGNOSIS — Y92410 Unspecified street and highway as the place of occurrence of the external cause: Secondary | ICD-10-CM | POA: Diagnosis not present

## 2018-03-14 DIAGNOSIS — S46911A Strain of unspecified muscle, fascia and tendon at shoulder and upper arm level, right arm, initial encounter: Secondary | ICD-10-CM | POA: Insufficient documentation

## 2018-03-14 DIAGNOSIS — S8991XA Unspecified injury of right lower leg, initial encounter: Secondary | ICD-10-CM | POA: Diagnosis not present

## 2018-03-14 DIAGNOSIS — S6991XA Unspecified injury of right wrist, hand and finger(s), initial encounter: Secondary | ICD-10-CM | POA: Diagnosis not present

## 2018-03-14 MED ORDER — OXYCODONE HCL 5 MG PO TABS: 5.0000 mg | ORAL_TABLET | ORAL | 0 refills | Status: DC | PRN

## 2018-03-14 MED ORDER — OXYCODONE HCL 5 MG PO TABS
5.0000 mg | ORAL_TABLET | Freq: Once | ORAL | Status: AC
  Administered 2018-03-14: 5 mg via ORAL
  Filled 2018-03-14: qty 1
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
## 2018-03-15 MED FILL — oxyCODONE HCL 5 MG TABS: 5 | 1 days supply | Qty: 6 | Fill #0

## 2018-04-05 DIAGNOSIS — E6609 Other obesity due to excess calories: Secondary | ICD-10-CM | POA: Diagnosis not present

## 2018-04-05 DIAGNOSIS — G43919 Migraine, unspecified, intractable, without status migrainosus: Secondary | ICD-10-CM | POA: Diagnosis not present

## 2018-04-05 DIAGNOSIS — S46009A Unspecified injury of muscle(s) and tendon(s) of the rotator cuff of unspecified shoulder, initial encounter: Secondary | ICD-10-CM | POA: Diagnosis not present

## 2018-04-05 DIAGNOSIS — T07XXXA Unspecified multiple injuries, initial encounter: Secondary | ICD-10-CM | POA: Diagnosis not present

## 2018-04-05 MED FILL — TOPIRAMATE 50 MG TABLET: 50 | 90 days supply | Qty: 180 | Fill #0

## 2018-04-08 MED FILL — traMADol HCL 50 MG TABS: 50 | 30 days supply | Qty: 90 | Fill #1

## 2018-04-14 MED FILL — OMEPRAZOLE 20 MG CPDR: 20 | 90 days supply | Qty: 180 | Fill #1

## 2018-04-14 MED FILL — SPIRONOLACTONE 25 MG TABS: 25 | 90 days supply | Qty: 180 | Fill #0

## 2018-04-15 DIAGNOSIS — M5020 Other cervical disc displacement, unspecified cervical region: Secondary | ICD-10-CM | POA: Diagnosis not present

## 2018-04-15 DIAGNOSIS — M545 Low back pain: Secondary | ICD-10-CM | POA: Diagnosis not present

## 2018-04-15 DIAGNOSIS — M47816 Spondylosis without myelopathy or radiculopathy, lumbar region: Secondary | ICD-10-CM | POA: Diagnosis not present

## 2018-04-15 DIAGNOSIS — M4302 Spondylolysis, cervical region: Secondary | ICD-10-CM | POA: Diagnosis not present

## 2018-04-15 DIAGNOSIS — M419 Scoliosis, unspecified: Secondary | ICD-10-CM | POA: Diagnosis not present

## 2018-04-15 DIAGNOSIS — M7581 Other shoulder lesions, right shoulder: Secondary | ICD-10-CM | POA: Diagnosis not present

## 2018-04-15 DIAGNOSIS — M25511 Pain in right shoulder: Secondary | ICD-10-CM | POA: Diagnosis not present

## 2018-04-15 MED FILL — PHENTERMINE HCL 15 MG CAPS: 15 | 90 days supply | Qty: 90 | Fill #0

## 2018-04-15 MED FILL — AMOXICILLIN 500 MG CAPSULE: 500 | 2 days supply | Qty: 8 | Fill #0

## 2018-04-26 MED FILL — AMOXICILLIN 500 MG CAPSULE: 500 | 2 days supply | Qty: 8 | Fill #0

## 2018-04-26 MED FILL — PHENTERMINE HCL 15 MG CAPS: 15 | 90 days supply | Qty: 90 | Fill #0

## 2018-04-29 DIAGNOSIS — M5020 Other cervical disc displacement, unspecified cervical region: Secondary | ICD-10-CM | POA: Diagnosis not present

## 2018-04-29 DIAGNOSIS — M545 Low back pain: Secondary | ICD-10-CM | POA: Diagnosis not present

## 2018-04-29 DIAGNOSIS — M7581 Other shoulder lesions, right shoulder: Secondary | ICD-10-CM | POA: Diagnosis not present

## 2018-04-29 DIAGNOSIS — M4302 Spondylolysis, cervical region: Secondary | ICD-10-CM | POA: Diagnosis not present

## 2018-05-05 DIAGNOSIS — S46009A Unspecified injury of muscle(s) and tendon(s) of the rotator cuff of unspecified shoulder, initial encounter: Secondary | ICD-10-CM | POA: Diagnosis not present

## 2018-05-05 DIAGNOSIS — T07XXXA Unspecified multiple injuries, initial encounter: Secondary | ICD-10-CM | POA: Diagnosis not present

## 2018-05-05 DIAGNOSIS — G43919 Migraine, unspecified, intractable, without status migrainosus: Secondary | ICD-10-CM | POA: Diagnosis not present

## 2018-05-05 DIAGNOSIS — E6609 Other obesity due to excess calories: Secondary | ICD-10-CM | POA: Diagnosis not present

## 2018-05-09 DIAGNOSIS — M25511 Pain in right shoulder: Secondary | ICD-10-CM | POA: Diagnosis not present

## 2018-05-09 DIAGNOSIS — M542 Cervicalgia: Secondary | ICD-10-CM | POA: Diagnosis not present

## 2018-05-09 DIAGNOSIS — M25611 Stiffness of right shoulder, not elsewhere classified: Secondary | ICD-10-CM | POA: Diagnosis not present

## 2018-05-09 DIAGNOSIS — M545 Low back pain: Secondary | ICD-10-CM | POA: Diagnosis not present

## 2018-05-12 DIAGNOSIS — M542 Cervicalgia: Secondary | ICD-10-CM | POA: Diagnosis not present

## 2018-05-12 DIAGNOSIS — M545 Low back pain: Secondary | ICD-10-CM | POA: Diagnosis not present

## 2018-05-12 DIAGNOSIS — M25611 Stiffness of right shoulder, not elsewhere classified: Secondary | ICD-10-CM | POA: Diagnosis not present

## 2018-05-12 DIAGNOSIS — M25511 Pain in right shoulder: Secondary | ICD-10-CM | POA: Diagnosis not present

## 2018-05-13 DIAGNOSIS — M25511 Pain in right shoulder: Secondary | ICD-10-CM | POA: Diagnosis not present

## 2018-05-13 DIAGNOSIS — M542 Cervicalgia: Secondary | ICD-10-CM | POA: Diagnosis not present

## 2018-05-13 DIAGNOSIS — M25611 Stiffness of right shoulder, not elsewhere classified: Secondary | ICD-10-CM | POA: Diagnosis not present

## 2018-05-13 DIAGNOSIS — M545 Low back pain: Secondary | ICD-10-CM | POA: Diagnosis not present

## 2018-05-16 DIAGNOSIS — M25611 Stiffness of right shoulder, not elsewhere classified: Secondary | ICD-10-CM | POA: Diagnosis not present

## 2018-05-16 DIAGNOSIS — M542 Cervicalgia: Secondary | ICD-10-CM | POA: Diagnosis not present

## 2018-05-16 DIAGNOSIS — M545 Low back pain: Secondary | ICD-10-CM | POA: Diagnosis not present

## 2018-05-16 DIAGNOSIS — M25511 Pain in right shoulder: Secondary | ICD-10-CM | POA: Diagnosis not present

## 2018-05-19 DIAGNOSIS — M25611 Stiffness of right shoulder, not elsewhere classified: Secondary | ICD-10-CM | POA: Diagnosis not present

## 2018-05-19 DIAGNOSIS — M542 Cervicalgia: Secondary | ICD-10-CM | POA: Diagnosis not present

## 2018-05-19 DIAGNOSIS — M25511 Pain in right shoulder: Secondary | ICD-10-CM | POA: Diagnosis not present

## 2018-05-19 DIAGNOSIS — M545 Low back pain: Secondary | ICD-10-CM | POA: Diagnosis not present

## 2018-05-19 MED FILL — FUROSEMIDE 20 MG TABS: 20 | 30 days supply | Qty: 30 | Fill #0

## 2018-05-19 MED FILL — traMADol HCL 50 MG TABS: 50 | 30 days supply | Qty: 90 | Fill #2

## 2018-05-20 DIAGNOSIS — M25611 Stiffness of right shoulder, not elsewhere classified: Secondary | ICD-10-CM | POA: Diagnosis not present

## 2018-05-20 DIAGNOSIS — M25511 Pain in right shoulder: Secondary | ICD-10-CM | POA: Diagnosis not present

## 2018-05-20 DIAGNOSIS — M545 Low back pain: Secondary | ICD-10-CM | POA: Diagnosis not present

## 2018-05-20 DIAGNOSIS — M542 Cervicalgia: Secondary | ICD-10-CM | POA: Diagnosis not present

## 2018-05-23 DIAGNOSIS — M25611 Stiffness of right shoulder, not elsewhere classified: Secondary | ICD-10-CM | POA: Diagnosis not present

## 2018-05-23 DIAGNOSIS — M545 Low back pain: Secondary | ICD-10-CM | POA: Diagnosis not present

## 2018-05-23 DIAGNOSIS — M25511 Pain in right shoulder: Secondary | ICD-10-CM | POA: Diagnosis not present

## 2018-05-23 DIAGNOSIS — M542 Cervicalgia: Secondary | ICD-10-CM | POA: Diagnosis not present

## 2018-05-26 DIAGNOSIS — M545 Low back pain: Secondary | ICD-10-CM | POA: Diagnosis not present

## 2018-05-26 DIAGNOSIS — M25611 Stiffness of right shoulder, not elsewhere classified: Secondary | ICD-10-CM | POA: Diagnosis not present

## 2018-05-26 DIAGNOSIS — M25511 Pain in right shoulder: Secondary | ICD-10-CM | POA: Diagnosis not present

## 2018-05-26 DIAGNOSIS — M542 Cervicalgia: Secondary | ICD-10-CM | POA: Diagnosis not present

## 2018-05-27 DIAGNOSIS — M545 Low back pain: Secondary | ICD-10-CM | POA: Diagnosis not present

## 2018-05-27 DIAGNOSIS — M25511 Pain in right shoulder: Secondary | ICD-10-CM | POA: Diagnosis not present

## 2018-05-27 DIAGNOSIS — M542 Cervicalgia: Secondary | ICD-10-CM | POA: Diagnosis not present

## 2018-05-27 DIAGNOSIS — M25611 Stiffness of right shoulder, not elsewhere classified: Secondary | ICD-10-CM | POA: Diagnosis not present

## 2018-05-30 DIAGNOSIS — M25511 Pain in right shoulder: Secondary | ICD-10-CM | POA: Diagnosis not present

## 2018-05-30 DIAGNOSIS — M545 Low back pain: Secondary | ICD-10-CM | POA: Diagnosis not present

## 2018-05-30 DIAGNOSIS — M25611 Stiffness of right shoulder, not elsewhere classified: Secondary | ICD-10-CM | POA: Diagnosis not present

## 2018-05-30 DIAGNOSIS — M542 Cervicalgia: Secondary | ICD-10-CM | POA: Diagnosis not present

## 2018-06-01 DIAGNOSIS — G43919 Migraine, unspecified, intractable, without status migrainosus: Secondary | ICD-10-CM | POA: Diagnosis not present

## 2018-06-01 DIAGNOSIS — E782 Mixed hyperlipidemia: Secondary | ICD-10-CM | POA: Diagnosis not present

## 2018-06-01 DIAGNOSIS — E559 Vitamin D deficiency, unspecified: Secondary | ICD-10-CM | POA: Diagnosis not present

## 2018-06-01 DIAGNOSIS — Z79899 Other long term (current) drug therapy: Secondary | ICD-10-CM | POA: Diagnosis not present

## 2018-06-01 DIAGNOSIS — T07XXXA Unspecified multiple injuries, initial encounter: Secondary | ICD-10-CM | POA: Diagnosis not present

## 2018-06-01 DIAGNOSIS — Z23 Encounter for immunization: Secondary | ICD-10-CM | POA: Diagnosis not present

## 2018-06-01 DIAGNOSIS — S46009A Unspecified injury of muscle(s) and tendon(s) of the rotator cuff of unspecified shoulder, initial encounter: Secondary | ICD-10-CM | POA: Diagnosis not present

## 2018-06-01 DIAGNOSIS — Z0000 Encounter for general adult medical examination without abnormal findings: Secondary | ICD-10-CM | POA: Diagnosis not present

## 2018-06-01 DIAGNOSIS — R600 Localized edema: Secondary | ICD-10-CM | POA: Diagnosis not present

## 2018-06-01 DIAGNOSIS — I10 Essential (primary) hypertension: Secondary | ICD-10-CM | POA: Diagnosis not present

## 2018-06-01 DIAGNOSIS — K219 Gastro-esophageal reflux disease without esophagitis: Secondary | ICD-10-CM | POA: Diagnosis not present

## 2018-06-02 DIAGNOSIS — M25511 Pain in right shoulder: Secondary | ICD-10-CM | POA: Diagnosis not present

## 2018-06-02 DIAGNOSIS — M542 Cervicalgia: Secondary | ICD-10-CM | POA: Diagnosis not present

## 2018-06-02 DIAGNOSIS — M545 Low back pain: Secondary | ICD-10-CM | POA: Diagnosis not present

## 2018-06-02 DIAGNOSIS — M25611 Stiffness of right shoulder, not elsewhere classified: Secondary | ICD-10-CM | POA: Diagnosis not present

## 2018-06-03 DIAGNOSIS — M25611 Stiffness of right shoulder, not elsewhere classified: Secondary | ICD-10-CM | POA: Diagnosis not present

## 2018-06-03 DIAGNOSIS — M25511 Pain in right shoulder: Secondary | ICD-10-CM | POA: Diagnosis not present

## 2018-06-03 DIAGNOSIS — M542 Cervicalgia: Secondary | ICD-10-CM | POA: Diagnosis not present

## 2018-06-03 DIAGNOSIS — M4302 Spondylolysis, cervical region: Secondary | ICD-10-CM | POA: Diagnosis not present

## 2018-06-03 DIAGNOSIS — M7581 Other shoulder lesions, right shoulder: Secondary | ICD-10-CM | POA: Diagnosis not present

## 2018-06-03 DIAGNOSIS — M5020 Other cervical disc displacement, unspecified cervical region: Secondary | ICD-10-CM | POA: Diagnosis not present

## 2018-06-03 DIAGNOSIS — M545 Low back pain: Secondary | ICD-10-CM | POA: Diagnosis not present

## 2018-06-06 DIAGNOSIS — M545 Low back pain: Secondary | ICD-10-CM | POA: Diagnosis not present

## 2018-06-06 DIAGNOSIS — M542 Cervicalgia: Secondary | ICD-10-CM | POA: Diagnosis not present

## 2018-06-06 DIAGNOSIS — M25611 Stiffness of right shoulder, not elsewhere classified: Secondary | ICD-10-CM | POA: Diagnosis not present

## 2018-06-06 DIAGNOSIS — M25511 Pain in right shoulder: Secondary | ICD-10-CM | POA: Diagnosis not present

## 2018-06-08 DIAGNOSIS — M545 Low back pain: Secondary | ICD-10-CM | POA: Diagnosis not present

## 2018-06-08 DIAGNOSIS — M25511 Pain in right shoulder: Secondary | ICD-10-CM | POA: Diagnosis not present

## 2018-06-08 DIAGNOSIS — M542 Cervicalgia: Secondary | ICD-10-CM | POA: Diagnosis not present

## 2018-06-08 DIAGNOSIS — M25611 Stiffness of right shoulder, not elsewhere classified: Secondary | ICD-10-CM | POA: Diagnosis not present

## 2018-06-14 DIAGNOSIS — M545 Low back pain: Secondary | ICD-10-CM | POA: Diagnosis not present

## 2018-06-14 DIAGNOSIS — M7918 Myalgia, other site: Secondary | ICD-10-CM | POA: Diagnosis not present

## 2018-06-14 DIAGNOSIS — M542 Cervicalgia: Secondary | ICD-10-CM | POA: Diagnosis not present

## 2018-06-14 DIAGNOSIS — M533 Sacrococcygeal disorders, not elsewhere classified: Secondary | ICD-10-CM | POA: Diagnosis not present

## 2018-06-15 DIAGNOSIS — M25511 Pain in right shoulder: Secondary | ICD-10-CM | POA: Diagnosis not present

## 2018-06-15 DIAGNOSIS — M25611 Stiffness of right shoulder, not elsewhere classified: Secondary | ICD-10-CM | POA: Diagnosis not present

## 2018-06-15 DIAGNOSIS — M545 Low back pain: Secondary | ICD-10-CM | POA: Diagnosis not present

## 2018-06-15 DIAGNOSIS — M542 Cervicalgia: Secondary | ICD-10-CM | POA: Diagnosis not present

## 2018-06-17 DIAGNOSIS — M545 Low back pain: Secondary | ICD-10-CM | POA: Diagnosis not present

## 2018-06-17 DIAGNOSIS — M25511 Pain in right shoulder: Secondary | ICD-10-CM | POA: Diagnosis not present

## 2018-06-17 DIAGNOSIS — M542 Cervicalgia: Secondary | ICD-10-CM | POA: Diagnosis not present

## 2018-06-17 DIAGNOSIS — M25611 Stiffness of right shoulder, not elsewhere classified: Secondary | ICD-10-CM | POA: Diagnosis not present

## 2018-06-20 DIAGNOSIS — G959 Disease of spinal cord, unspecified: Secondary | ICD-10-CM | POA: Diagnosis not present

## 2018-06-20 DIAGNOSIS — M50322 Other cervical disc degeneration at C5-C6 level: Secondary | ICD-10-CM | POA: Diagnosis not present

## 2018-06-20 DIAGNOSIS — M545 Low back pain: Secondary | ICD-10-CM | POA: Diagnosis not present

## 2018-06-20 DIAGNOSIS — M542 Cervicalgia: Secondary | ICD-10-CM | POA: Diagnosis not present

## 2018-06-20 DIAGNOSIS — M25511 Pain in right shoulder: Secondary | ICD-10-CM | POA: Diagnosis not present

## 2018-06-20 DIAGNOSIS — M25611 Stiffness of right shoulder, not elsewhere classified: Secondary | ICD-10-CM | POA: Diagnosis not present

## 2018-06-22 DIAGNOSIS — M545 Low back pain: Secondary | ICD-10-CM | POA: Diagnosis not present

## 2018-06-22 DIAGNOSIS — M25611 Stiffness of right shoulder, not elsewhere classified: Secondary | ICD-10-CM | POA: Diagnosis not present

## 2018-06-22 DIAGNOSIS — M542 Cervicalgia: Secondary | ICD-10-CM | POA: Diagnosis not present

## 2018-06-22 DIAGNOSIS — M25511 Pain in right shoulder: Secondary | ICD-10-CM | POA: Diagnosis not present

## 2018-06-24 DIAGNOSIS — M5020 Other cervical disc displacement, unspecified cervical region: Secondary | ICD-10-CM | POA: Diagnosis not present

## 2018-06-24 DIAGNOSIS — M4302 Spondylolysis, cervical region: Secondary | ICD-10-CM | POA: Diagnosis not present

## 2018-06-24 DIAGNOSIS — M545 Low back pain: Secondary | ICD-10-CM | POA: Diagnosis not present

## 2018-06-24 DIAGNOSIS — M7581 Other shoulder lesions, right shoulder: Secondary | ICD-10-CM | POA: Diagnosis not present

## 2018-06-27 DIAGNOSIS — M25511 Pain in right shoulder: Secondary | ICD-10-CM | POA: Diagnosis not present

## 2018-06-27 DIAGNOSIS — M542 Cervicalgia: Secondary | ICD-10-CM | POA: Diagnosis not present

## 2018-06-27 DIAGNOSIS — M25611 Stiffness of right shoulder, not elsewhere classified: Secondary | ICD-10-CM | POA: Diagnosis not present

## 2018-06-27 DIAGNOSIS — M545 Low back pain: Secondary | ICD-10-CM | POA: Diagnosis not present

## 2018-06-28 DIAGNOSIS — M7918 Myalgia, other site: Secondary | ICD-10-CM | POA: Diagnosis not present

## 2018-06-28 DIAGNOSIS — M542 Cervicalgia: Secondary | ICD-10-CM | POA: Diagnosis not present

## 2018-06-28 DIAGNOSIS — M545 Low back pain: Secondary | ICD-10-CM | POA: Diagnosis not present

## 2018-06-28 DIAGNOSIS — M533 Sacrococcygeal disorders, not elsewhere classified: Secondary | ICD-10-CM | POA: Diagnosis not present

## 2018-06-29 DIAGNOSIS — R600 Localized edema: Secondary | ICD-10-CM | POA: Diagnosis not present

## 2018-06-29 DIAGNOSIS — E782 Mixed hyperlipidemia: Secondary | ICD-10-CM | POA: Diagnosis not present

## 2018-06-29 DIAGNOSIS — J309 Allergic rhinitis, unspecified: Secondary | ICD-10-CM | POA: Diagnosis not present

## 2018-06-29 DIAGNOSIS — M75100 Unspecified rotator cuff tear or rupture of unspecified shoulder, not specified as traumatic: Secondary | ICD-10-CM | POA: Diagnosis not present

## 2018-06-29 DIAGNOSIS — R000 Tachycardia, unspecified: Secondary | ICD-10-CM | POA: Diagnosis not present

## 2018-06-29 DIAGNOSIS — I10 Essential (primary) hypertension: Secondary | ICD-10-CM | POA: Diagnosis not present

## 2018-06-29 MED FILL — ATORVASTATIN 10 MG TABLET: 10 | 90 days supply | Qty: 90 | Fill #0 | Status: TO

## 2018-06-29 MED FILL — METOPROLOL SUCCINATE ER 25: 25 | 90 days supply | Qty: 90 | Fill #0 | Status: TO

## 2018-06-30 DIAGNOSIS — M25511 Pain in right shoulder: Secondary | ICD-10-CM | POA: Diagnosis not present

## 2018-06-30 DIAGNOSIS — M542 Cervicalgia: Secondary | ICD-10-CM | POA: Diagnosis not present

## 2018-06-30 DIAGNOSIS — M25611 Stiffness of right shoulder, not elsewhere classified: Secondary | ICD-10-CM | POA: Diagnosis not present

## 2018-06-30 DIAGNOSIS — M545 Low back pain: Secondary | ICD-10-CM | POA: Diagnosis not present

## 2018-07-04 MED FILL — FUROSEMIDE 20 MG TABS: 20 | 30 days supply | Qty: 30 | Fill #0 | Status: TO

## 2018-07-04 MED FILL — AMOXICILLIN 500 MG CAPSULE: 500 | 2 days supply | Qty: 8 | Fill #0

## 2018-07-05 MED FILL — IBUPROFEN 600 MG TABLET: 600 | 5 days supply | Qty: 20 | Fill #0

## 2018-07-05 MED FILL — traMADol HCL 50 MG TABS: 50 | 30 days supply | Qty: 90 | Fill #0 | Status: TO

## 2018-07-06 DIAGNOSIS — E786 Lipoprotein deficiency: Secondary | ICD-10-CM | POA: Diagnosis not present

## 2018-07-07 ENCOUNTER — Emergency Department (HOSPITAL_COMMUNITY)
Admission: EM | Admit: 2018-07-07 | Discharge: 2018-07-08 | Disposition: A | Discharge status: Home / Self Care | Payer: 59 | Attending: Emergency Medicine | Admitting: Emergency Medicine

## 2018-07-07 ENCOUNTER — Encounter (HOSPITAL_COMMUNITY): Payer: Self-pay | Admitting: Emergency Medicine

## 2018-07-07 ENCOUNTER — Other Ambulatory Visit: Payer: Self-pay

## 2018-07-07 DIAGNOSIS — M5440 Lumbago with sciatica, unspecified side: Secondary | ICD-10-CM | POA: Insufficient documentation

## 2018-07-07 DIAGNOSIS — Z96653 Presence of artificial knee joint, bilateral: Secondary | ICD-10-CM | POA: Insufficient documentation

## 2018-07-07 DIAGNOSIS — Z79899 Other long term (current) drug therapy: Secondary | ICD-10-CM | POA: Diagnosis not present

## 2018-07-07 DIAGNOSIS — I10 Essential (primary) hypertension: Secondary | ICD-10-CM | POA: Insufficient documentation

## 2018-07-07 DIAGNOSIS — M545 Low back pain: Secondary | ICD-10-CM | POA: Diagnosis not present
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
## 2018-07-08 ENCOUNTER — Emergency Department (HOSPITAL_COMMUNITY): Payer: 59

## 2018-07-08 DIAGNOSIS — M5440 Lumbago with sciatica, unspecified side: Secondary | ICD-10-CM | POA: Diagnosis not present

## 2018-07-08 DIAGNOSIS — Z79899 Other long term (current) drug therapy: Secondary | ICD-10-CM | POA: Diagnosis not present

## 2018-07-08 DIAGNOSIS — Z96653 Presence of artificial knee joint, bilateral: Secondary | ICD-10-CM | POA: Diagnosis not present

## 2018-07-08 DIAGNOSIS — I10 Essential (primary) hypertension: Secondary | ICD-10-CM | POA: Diagnosis not present

## 2018-07-08 DIAGNOSIS — M545 Low back pain: Secondary | ICD-10-CM | POA: Diagnosis not present

## 2018-07-08 MED ORDER — METHOCARBAMOL 500 MG PO TABS: 500.0000 mg | ORAL_TABLET | Freq: Two times a day (BID) | ORAL | 0 refills | Status: DC

## 2018-07-08 MED ORDER — HYDROCODONE-ACETAMINOPHEN 5-325 MG PO TABS
2.0000 | ORAL_TABLET | Freq: Once | ORAL | Status: AC
  Administered 2018-07-08: 2 via ORAL
  Filled 2018-07-08: qty 2

## 2018-07-08 MED ORDER — HYDROCODONE-ACETAMINOPHEN 5-325 MG PO TABS: 1.0000 | ORAL_TABLET | Freq: Four times a day (QID) | ORAL | 0 refills | Status: DC | PRN

## 2018-07-08 MED FILL — HYDROCODON-APAP 5-325: 5-325 | 2 days supply | Qty: 6 | Fill #0

## 2018-07-08 MED FILL — METHOCARBAMOL 500 MG TABS: 500 | 10 days supply | Qty: 20 | Fill #0
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
## 2018-07-08 NOTE — Discharge Instructions (Addendum)
1. Medications: robaxin, vicodin, usual home medications °2. Treatment: rest, drink plenty of fluids, gentle stretching as discussed, alternate ice and heat °3. Follow Up: Please followup with your primary doctor in 3 days for discussion of your diagnoses and further evaluation after today's visit; if you do not have a primary care doctor use the resource guide provided to find one;  Return to the ER for worsening back pain, difficulty walking, loss of bowel or bladder control or other concerning symptoms ° ° ° °

## 2018-07-15 MED FILL — SPIRONOLACTONE 25 MG TABS: 25 | 90 days supply | Qty: 180 | Fill #1

## 2018-07-15 MED FILL — TOPIRAMATE 50 MG TABLET: 50 | 90 days supply | Qty: 180 | Fill #1

## 2018-07-22 DIAGNOSIS — M545 Low back pain: Secondary | ICD-10-CM | POA: Diagnosis not present

## 2018-07-22 DIAGNOSIS — M5020 Other cervical disc displacement, unspecified cervical region: Secondary | ICD-10-CM | POA: Diagnosis not present

## 2018-07-22 DIAGNOSIS — M7581 Other shoulder lesions, right shoulder: Secondary | ICD-10-CM | POA: Diagnosis not present

## 2018-07-22 DIAGNOSIS — M4302 Spondylolysis, cervical region: Secondary | ICD-10-CM | POA: Diagnosis not present

## 2018-07-28 DIAGNOSIS — M542 Cervicalgia: Secondary | ICD-10-CM | POA: Diagnosis not present

## 2018-07-28 DIAGNOSIS — M545 Low back pain: Secondary | ICD-10-CM | POA: Diagnosis not present

## 2018-07-28 DIAGNOSIS — M7918 Myalgia, other site: Secondary | ICD-10-CM | POA: Diagnosis not present

## 2018-07-28 DIAGNOSIS — M533 Sacrococcygeal disorders, not elsewhere classified: Secondary | ICD-10-CM | POA: Diagnosis not present

## 2018-08-15 DIAGNOSIS — T07XXXA Unspecified multiple injuries, initial encounter: Secondary | ICD-10-CM | POA: Diagnosis not present

## 2018-08-15 DIAGNOSIS — M75100 Unspecified rotator cuff tear or rupture of unspecified shoulder, not specified as traumatic: Secondary | ICD-10-CM | POA: Diagnosis not present

## 2018-08-15 DIAGNOSIS — I10 Essential (primary) hypertension: Secondary | ICD-10-CM | POA: Diagnosis not present

## 2018-08-15 DIAGNOSIS — R000 Tachycardia, unspecified: Secondary | ICD-10-CM | POA: Diagnosis not present

## 2018-08-15 DIAGNOSIS — J309 Allergic rhinitis, unspecified: Secondary | ICD-10-CM | POA: Diagnosis not present

## 2018-08-15 DIAGNOSIS — R600 Localized edema: Secondary | ICD-10-CM | POA: Diagnosis not present

## 2018-08-15 DIAGNOSIS — E782 Mixed hyperlipidemia: Secondary | ICD-10-CM | POA: Diagnosis not present

## 2018-08-15 DIAGNOSIS — G43919 Migraine, unspecified, intractable, without status migrainosus: Secondary | ICD-10-CM | POA: Diagnosis not present

## 2018-08-16 MED FILL — FUROSEMIDE 20 MG TABS: 20 | 30 days supply | Qty: 30 | Fill #0

## 2018-08-16 MED FILL — traMADol HCL 50 MG TABS: 50 | 30 days supply | Qty: 90 | Fill #0

## 2018-08-16 MED FILL — OMEPRAZOLE 20 MG CPDR: 20 | 60 days supply | Qty: 120 | Fill #2

## 2018-08-16 MED FILL — PHENTERMINE 15 MG CAPSULE: 15 | 90 days supply | Qty: 90 | Fill #1

## 2018-08-25 DIAGNOSIS — M542 Cervicalgia: Secondary | ICD-10-CM | POA: Diagnosis not present

## 2018-08-25 DIAGNOSIS — M7918 Myalgia, other site: Secondary | ICD-10-CM | POA: Diagnosis not present

## 2018-08-25 DIAGNOSIS — M533 Sacrococcygeal disorders, not elsewhere classified: Secondary | ICD-10-CM | POA: Diagnosis not present

## 2018-08-25 DIAGNOSIS — M545 Low back pain: Secondary | ICD-10-CM | POA: Diagnosis not present

## 2018-09-02 DIAGNOSIS — M4302 Spondylolysis, cervical region: Secondary | ICD-10-CM | POA: Diagnosis not present

## 2018-09-02 DIAGNOSIS — M5020 Other cervical disc displacement, unspecified cervical region: Secondary | ICD-10-CM | POA: Diagnosis not present

## 2018-09-02 DIAGNOSIS — E876 Hypokalemia: Secondary | ICD-10-CM | POA: Diagnosis not present

## 2018-09-02 DIAGNOSIS — M7581 Other shoulder lesions, right shoulder: Secondary | ICD-10-CM | POA: Diagnosis not present

## 2018-09-02 DIAGNOSIS — M545 Low back pain: Secondary | ICD-10-CM | POA: Diagnosis not present

## 2018-09-04 DIAGNOSIS — Z01812 Encounter for preprocedural laboratory examination: Secondary | ICD-10-CM | POA: Diagnosis not present

## 2018-09-04 DIAGNOSIS — M7581 Other shoulder lesions, right shoulder: Secondary | ICD-10-CM | POA: Diagnosis not present

## 2018-09-04 DIAGNOSIS — S46011D Strain of muscle(s) and tendon(s) of the rotator cuff of right shoulder, subsequent encounter: Secondary | ICD-10-CM | POA: Diagnosis not present

## 2018-09-04 DIAGNOSIS — M5020 Other cervical disc displacement, unspecified cervical region: Secondary | ICD-10-CM | POA: Diagnosis not present

## 2018-09-04 DIAGNOSIS — Z1159 Encounter for screening for other viral diseases: Secondary | ICD-10-CM | POA: Diagnosis not present

## 2018-09-07 DIAGNOSIS — I10 Essential (primary) hypertension: Secondary | ICD-10-CM | POA: Diagnosis not present

## 2018-09-07 DIAGNOSIS — Z886 Allergy status to analgesic agent status: Secondary | ICD-10-CM | POA: Diagnosis not present

## 2018-09-07 DIAGNOSIS — K759 Inflammatory liver disease, unspecified: Secondary | ICD-10-CM | POA: Diagnosis not present

## 2018-09-07 DIAGNOSIS — M545 Low back pain: Secondary | ICD-10-CM | POA: Diagnosis not present

## 2018-09-07 DIAGNOSIS — S46011A Strain of muscle(s) and tendon(s) of the rotator cuff of right shoulder, initial encounter: Secondary | ICD-10-CM | POA: Diagnosis not present

## 2018-09-07 DIAGNOSIS — M5020 Other cervical disc displacement, unspecified cervical region: Secondary | ICD-10-CM | POA: Diagnosis not present

## 2018-09-07 DIAGNOSIS — M7581 Other shoulder lesions, right shoulder: Secondary | ICD-10-CM | POA: Diagnosis not present

## 2018-09-07 DIAGNOSIS — M75121 Complete rotator cuff tear or rupture of right shoulder, not specified as traumatic: Secondary | ICD-10-CM | POA: Diagnosis not present

## 2018-09-07 DIAGNOSIS — Z79899 Other long term (current) drug therapy: Secondary | ICD-10-CM | POA: Diagnosis not present

## 2018-09-07 DIAGNOSIS — G8918 Other acute postprocedural pain: Secondary | ICD-10-CM | POA: Diagnosis not present

## 2018-09-07 DIAGNOSIS — M4302 Spondylolysis, cervical region: Secondary | ICD-10-CM | POA: Diagnosis not present

## 2018-09-20 DIAGNOSIS — M7918 Myalgia, other site: Secondary | ICD-10-CM | POA: Diagnosis not present

## 2018-09-20 DIAGNOSIS — R293 Abnormal posture: Secondary | ICD-10-CM | POA: Diagnosis not present

## 2018-09-20 DIAGNOSIS — M545 Low back pain: Secondary | ICD-10-CM | POA: Diagnosis not present

## 2018-09-20 DIAGNOSIS — M533 Sacrococcygeal disorders, not elsewhere classified: Secondary | ICD-10-CM | POA: Diagnosis not present

## 2018-09-20 DIAGNOSIS — M25611 Stiffness of right shoulder, not elsewhere classified: Secondary | ICD-10-CM | POA: Diagnosis not present

## 2018-09-20 DIAGNOSIS — M542 Cervicalgia: Secondary | ICD-10-CM | POA: Diagnosis not present

## 2018-09-20 DIAGNOSIS — M6281 Muscle weakness (generalized): Secondary | ICD-10-CM | POA: Diagnosis not present

## 2018-09-20 DIAGNOSIS — Z9889 Other specified postprocedural states: Secondary | ICD-10-CM | POA: Diagnosis not present

## 2018-09-20 DIAGNOSIS — M25511 Pain in right shoulder: Secondary | ICD-10-CM | POA: Diagnosis not present

## 2018-09-23 DIAGNOSIS — M25611 Stiffness of right shoulder, not elsewhere classified: Secondary | ICD-10-CM | POA: Diagnosis not present

## 2018-09-23 DIAGNOSIS — M25511 Pain in right shoulder: Secondary | ICD-10-CM | POA: Diagnosis not present

## 2018-09-23 DIAGNOSIS — M6281 Muscle weakness (generalized): Secondary | ICD-10-CM | POA: Diagnosis not present

## 2018-09-23 DIAGNOSIS — Z9889 Other specified postprocedural states: Secondary | ICD-10-CM | POA: Diagnosis not present

## 2018-09-23 DIAGNOSIS — R293 Abnormal posture: Secondary | ICD-10-CM | POA: Diagnosis not present

## 2018-09-27 MED FILL — ATORVASTATIN 10 MG TABLET: 10 | 90 days supply | Qty: 90 | Fill #0

## 2018-09-27 MED FILL — traMADol HCL 50 MG TABS: 50 | 30 days supply | Qty: 90 | Fill #0

## 2018-09-27 MED FILL — CYCLOBENZAPRINE 10 MG TAB: 10 | 30 days supply | Qty: 30 | Fill #0

## 2018-09-27 MED FILL — METOPROLOL SUCCINATE ER 25: 25 | 90 days supply | Qty: 90 | Fill #0

## 2018-09-28 DIAGNOSIS — M25511 Pain in right shoulder: Secondary | ICD-10-CM | POA: Diagnosis not present

## 2018-09-28 DIAGNOSIS — M6281 Muscle weakness (generalized): Secondary | ICD-10-CM | POA: Diagnosis not present

## 2018-09-28 DIAGNOSIS — Z9889 Other specified postprocedural states: Secondary | ICD-10-CM | POA: Diagnosis not present

## 2018-09-28 DIAGNOSIS — R293 Abnormal posture: Secondary | ICD-10-CM | POA: Diagnosis not present

## 2018-09-28 DIAGNOSIS — M25611 Stiffness of right shoulder, not elsewhere classified: Secondary | ICD-10-CM | POA: Diagnosis not present

## 2018-09-30 DIAGNOSIS — M25611 Stiffness of right shoulder, not elsewhere classified: Secondary | ICD-10-CM | POA: Diagnosis not present

## 2018-09-30 DIAGNOSIS — R293 Abnormal posture: Secondary | ICD-10-CM | POA: Diagnosis not present

## 2018-09-30 DIAGNOSIS — M6281 Muscle weakness (generalized): Secondary | ICD-10-CM | POA: Diagnosis not present

## 2018-09-30 DIAGNOSIS — Z9889 Other specified postprocedural states: Secondary | ICD-10-CM | POA: Diagnosis not present

## 2018-09-30 DIAGNOSIS — M25511 Pain in right shoulder: Secondary | ICD-10-CM | POA: Diagnosis not present

## 2018-10-03 DIAGNOSIS — Z9889 Other specified postprocedural states: Secondary | ICD-10-CM | POA: Diagnosis not present

## 2018-10-03 DIAGNOSIS — M25511 Pain in right shoulder: Secondary | ICD-10-CM | POA: Diagnosis not present

## 2018-10-03 DIAGNOSIS — M25611 Stiffness of right shoulder, not elsewhere classified: Secondary | ICD-10-CM | POA: Diagnosis not present

## 2018-10-03 DIAGNOSIS — M6281 Muscle weakness (generalized): Secondary | ICD-10-CM | POA: Diagnosis not present

## 2018-10-03 DIAGNOSIS — R293 Abnormal posture: Secondary | ICD-10-CM | POA: Diagnosis not present

## 2018-10-17 MED FILL — SPIRONOLACTONE 25 MG TABS: 25 | 90 days supply | Qty: 180 | Fill #2

## 2018-10-17 MED FILL — TOPIRAMATE 50 MG TABLET: 50 | 90 days supply | Qty: 180 | Fill #2

## 2018-10-27 DIAGNOSIS — Z9889 Other specified postprocedural states: Secondary | ICD-10-CM | POA: Diagnosis not present

## 2018-10-27 DIAGNOSIS — M6281 Muscle weakness (generalized): Secondary | ICD-10-CM | POA: Diagnosis not present

## 2018-10-27 DIAGNOSIS — M25511 Pain in right shoulder: Secondary | ICD-10-CM | POA: Diagnosis not present

## 2018-10-27 DIAGNOSIS — M25611 Stiffness of right shoulder, not elsewhere classified: Secondary | ICD-10-CM | POA: Diagnosis not present

## 2018-10-27 DIAGNOSIS — R293 Abnormal posture: Secondary | ICD-10-CM | POA: Diagnosis not present

## 2018-10-31 DIAGNOSIS — M25511 Pain in right shoulder: Secondary | ICD-10-CM | POA: Diagnosis not present

## 2018-10-31 DIAGNOSIS — R293 Abnormal posture: Secondary | ICD-10-CM | POA: Diagnosis not present

## 2018-10-31 DIAGNOSIS — M25611 Stiffness of right shoulder, not elsewhere classified: Secondary | ICD-10-CM | POA: Diagnosis not present

## 2018-10-31 DIAGNOSIS — Z9889 Other specified postprocedural states: Secondary | ICD-10-CM | POA: Diagnosis not present

## 2018-10-31 DIAGNOSIS — M6281 Muscle weakness (generalized): Secondary | ICD-10-CM | POA: Diagnosis not present

## 2018-11-03 DIAGNOSIS — M6281 Muscle weakness (generalized): Secondary | ICD-10-CM | POA: Diagnosis not present

## 2018-11-03 DIAGNOSIS — M25511 Pain in right shoulder: Secondary | ICD-10-CM | POA: Diagnosis not present

## 2018-11-03 DIAGNOSIS — R293 Abnormal posture: Secondary | ICD-10-CM | POA: Diagnosis not present

## 2018-11-03 DIAGNOSIS — M25611 Stiffness of right shoulder, not elsewhere classified: Secondary | ICD-10-CM | POA: Diagnosis not present

## 2018-11-03 DIAGNOSIS — Z9889 Other specified postprocedural states: Secondary | ICD-10-CM | POA: Diagnosis not present

## 2018-11-07 DIAGNOSIS — R293 Abnormal posture: Secondary | ICD-10-CM | POA: Diagnosis not present

## 2018-11-07 DIAGNOSIS — M25611 Stiffness of right shoulder, not elsewhere classified: Secondary | ICD-10-CM | POA: Diagnosis not present

## 2018-11-07 DIAGNOSIS — M25511 Pain in right shoulder: Secondary | ICD-10-CM | POA: Diagnosis not present

## 2018-11-07 DIAGNOSIS — M6281 Muscle weakness (generalized): Secondary | ICD-10-CM | POA: Diagnosis not present

## 2018-11-07 DIAGNOSIS — Z9889 Other specified postprocedural states: Secondary | ICD-10-CM | POA: Diagnosis not present

## 2018-11-08 DIAGNOSIS — M25611 Stiffness of right shoulder, not elsewhere classified: Secondary | ICD-10-CM | POA: Diagnosis not present

## 2018-11-08 DIAGNOSIS — Z9889 Other specified postprocedural states: Secondary | ICD-10-CM | POA: Diagnosis not present

## 2018-11-08 DIAGNOSIS — M6281 Muscle weakness (generalized): Secondary | ICD-10-CM | POA: Diagnosis not present

## 2018-11-08 DIAGNOSIS — M25511 Pain in right shoulder: Secondary | ICD-10-CM | POA: Diagnosis not present

## 2018-11-08 DIAGNOSIS — R293 Abnormal posture: Secondary | ICD-10-CM | POA: Diagnosis not present

## 2018-11-10 DIAGNOSIS — Z9889 Other specified postprocedural states: Secondary | ICD-10-CM | POA: Diagnosis not present

## 2018-11-10 DIAGNOSIS — M25611 Stiffness of right shoulder, not elsewhere classified: Secondary | ICD-10-CM | POA: Diagnosis not present

## 2018-11-10 DIAGNOSIS — M25511 Pain in right shoulder: Secondary | ICD-10-CM | POA: Diagnosis not present

## 2018-11-10 DIAGNOSIS — M6281 Muscle weakness (generalized): Secondary | ICD-10-CM | POA: Diagnosis not present

## 2018-11-10 DIAGNOSIS — R293 Abnormal posture: Secondary | ICD-10-CM | POA: Diagnosis not present

## 2018-11-15 DIAGNOSIS — M25511 Pain in right shoulder: Secondary | ICD-10-CM | POA: Diagnosis not present

## 2018-11-15 DIAGNOSIS — M6281 Muscle weakness (generalized): Secondary | ICD-10-CM | POA: Diagnosis not present

## 2018-11-15 DIAGNOSIS — Z9889 Other specified postprocedural states: Secondary | ICD-10-CM | POA: Diagnosis not present

## 2018-11-15 DIAGNOSIS — M25611 Stiffness of right shoulder, not elsewhere classified: Secondary | ICD-10-CM | POA: Diagnosis not present

## 2018-11-15 DIAGNOSIS — R293 Abnormal posture: Secondary | ICD-10-CM | POA: Diagnosis not present

## 2018-11-16 ENCOUNTER — Other Ambulatory Visit: Payer: Self-pay | Admitting: Internal Medicine

## 2018-11-16 DIAGNOSIS — Z1231 Encounter for screening mammogram for malignant neoplasm of breast: Secondary | ICD-10-CM

## 2018-11-17 DIAGNOSIS — Z9889 Other specified postprocedural states: Secondary | ICD-10-CM | POA: Diagnosis not present

## 2018-11-17 DIAGNOSIS — M25611 Stiffness of right shoulder, not elsewhere classified: Secondary | ICD-10-CM | POA: Diagnosis not present

## 2018-11-17 DIAGNOSIS — M6281 Muscle weakness (generalized): Secondary | ICD-10-CM | POA: Diagnosis not present

## 2018-11-17 DIAGNOSIS — R293 Abnormal posture: Secondary | ICD-10-CM | POA: Diagnosis not present

## 2018-11-17 DIAGNOSIS — M25511 Pain in right shoulder: Secondary | ICD-10-CM | POA: Diagnosis not present

## 2018-11-22 DIAGNOSIS — M25611 Stiffness of right shoulder, not elsewhere classified: Secondary | ICD-10-CM | POA: Diagnosis not present

## 2018-11-22 DIAGNOSIS — M25511 Pain in right shoulder: Secondary | ICD-10-CM | POA: Diagnosis not present

## 2018-11-22 DIAGNOSIS — Z9889 Other specified postprocedural states: Secondary | ICD-10-CM | POA: Diagnosis not present

## 2018-11-22 DIAGNOSIS — R293 Abnormal posture: Secondary | ICD-10-CM | POA: Diagnosis not present

## 2018-11-22 DIAGNOSIS — M6281 Muscle weakness (generalized): Secondary | ICD-10-CM | POA: Diagnosis not present

## 2018-11-24 DIAGNOSIS — M6281 Muscle weakness (generalized): Secondary | ICD-10-CM | POA: Diagnosis not present

## 2018-11-24 DIAGNOSIS — M25511 Pain in right shoulder: Secondary | ICD-10-CM | POA: Diagnosis not present

## 2018-11-24 DIAGNOSIS — R293 Abnormal posture: Secondary | ICD-10-CM | POA: Diagnosis not present

## 2018-11-24 DIAGNOSIS — M25611 Stiffness of right shoulder, not elsewhere classified: Secondary | ICD-10-CM | POA: Diagnosis not present

## 2018-11-24 DIAGNOSIS — Z9889 Other specified postprocedural states: Secondary | ICD-10-CM | POA: Diagnosis not present

## 2018-11-24 MED FILL — traMADol HCL 50 MG TABS: 50 | 30 days supply | Qty: 90 | Fill #0

## 2018-11-28 DIAGNOSIS — Z9889 Other specified postprocedural states: Secondary | ICD-10-CM | POA: Diagnosis not present

## 2018-11-28 DIAGNOSIS — R293 Abnormal posture: Secondary | ICD-10-CM | POA: Diagnosis not present

## 2018-11-28 DIAGNOSIS — M6281 Muscle weakness (generalized): Secondary | ICD-10-CM | POA: Diagnosis not present

## 2018-11-28 DIAGNOSIS — M25511 Pain in right shoulder: Secondary | ICD-10-CM | POA: Diagnosis not present

## 2018-11-28 DIAGNOSIS — M25611 Stiffness of right shoulder, not elsewhere classified: Secondary | ICD-10-CM | POA: Diagnosis not present

## 2018-11-28 MED FILL — FUROSEMIDE 20 MG TABS: 20 | 30 days supply | Qty: 30 | Fill #1

## 2018-12-01 DIAGNOSIS — M6281 Muscle weakness (generalized): Secondary | ICD-10-CM | POA: Diagnosis not present

## 2018-12-01 DIAGNOSIS — R293 Abnormal posture: Secondary | ICD-10-CM | POA: Diagnosis not present

## 2018-12-01 DIAGNOSIS — M25611 Stiffness of right shoulder, not elsewhere classified: Secondary | ICD-10-CM | POA: Diagnosis not present

## 2018-12-01 DIAGNOSIS — Z9889 Other specified postprocedural states: Secondary | ICD-10-CM | POA: Diagnosis not present

## 2018-12-01 DIAGNOSIS — M25511 Pain in right shoulder: Secondary | ICD-10-CM | POA: Diagnosis not present

## 2018-12-21 DIAGNOSIS — Z20828 Contact with and (suspected) exposure to other viral communicable diseases: Secondary | ICD-10-CM | POA: Diagnosis not present

## 2018-12-23 DIAGNOSIS — M4302 Spondylolysis, cervical region: Secondary | ICD-10-CM | POA: Diagnosis not present

## 2018-12-23 DIAGNOSIS — M7581 Other shoulder lesions, right shoulder: Secondary | ICD-10-CM | POA: Diagnosis not present

## 2018-12-23 DIAGNOSIS — M5020 Other cervical disc displacement, unspecified cervical region: Secondary | ICD-10-CM | POA: Diagnosis not present

## 2018-12-23 DIAGNOSIS — M545 Low back pain: Secondary | ICD-10-CM | POA: Diagnosis not present

## 2018-12-28 DIAGNOSIS — K219 Gastro-esophageal reflux disease without esophagitis: Secondary | ICD-10-CM | POA: Diagnosis not present

## 2018-12-28 DIAGNOSIS — J309 Allergic rhinitis, unspecified: Secondary | ICD-10-CM | POA: Diagnosis not present

## 2018-12-28 DIAGNOSIS — Z23 Encounter for immunization: Secondary | ICD-10-CM | POA: Diagnosis not present

## 2018-12-28 DIAGNOSIS — E559 Vitamin D deficiency, unspecified: Secondary | ICD-10-CM | POA: Diagnosis not present

## 2018-12-28 DIAGNOSIS — I10 Essential (primary) hypertension: Secondary | ICD-10-CM | POA: Diagnosis not present

## 2018-12-28 DIAGNOSIS — G43919 Migraine, unspecified, intractable, without status migrainosus: Secondary | ICD-10-CM | POA: Diagnosis not present

## 2018-12-28 DIAGNOSIS — E6609 Other obesity due to excess calories: Secondary | ICD-10-CM | POA: Diagnosis not present

## 2018-12-28 DIAGNOSIS — E782 Mixed hyperlipidemia: Secondary | ICD-10-CM | POA: Diagnosis not present

## 2018-12-28 DIAGNOSIS — M75100 Unspecified rotator cuff tear or rupture of unspecified shoulder, not specified as traumatic: Secondary | ICD-10-CM | POA: Diagnosis not present

## 2018-12-28 MED FILL — traMADol HCL 50 MG TABS: 50 | 30 days supply | Qty: 90 | Fill #0

## 2018-12-28 MED FILL — METOPROLOL SUCCINATE ER 25: 25 | 90 days supply | Qty: 90 | Fill #0

## 2018-12-28 MED FILL — OMEPRAZOLE 20 MG CAPSULE DR: 20 | 60 days supply | Qty: 120 | Fill #0

## 2018-12-28 MED FILL — PHENTERMINE 15 MG CAPSULE: 15 | 60 days supply | Qty: 30 | Fill #0

## 2018-12-28 MED FILL — ATORVASTATIN 10 MG TABLET: 10 | 90 days supply | Qty: 90 | Fill #0

## 2019-01-03 ENCOUNTER — Other Ambulatory Visit: Payer: Self-pay

## 2019-01-03 ENCOUNTER — Ambulatory Visit
Admission: RE | Admit: 2019-01-03 | Discharge: 2019-01-03 | Disposition: A | Discharge status: Home / Self Care | Payer: 59 | Source: Ambulatory Visit | Attending: Internal Medicine | Admitting: Internal Medicine

## 2019-01-03 DIAGNOSIS — Z1231 Encounter for screening mammogram for malignant neoplasm of breast: Secondary | ICD-10-CM | POA: Diagnosis not present

## 2019-01-16 MED FILL — SPIRONOLACTONE 25 MG TABS: 25 | 90 days supply | Qty: 180 | Fill #3

## 2019-01-16 MED FILL — FUROSEMIDE 20 MG TABS: 20 | 30 days supply | Qty: 30 | Fill #2

## 2019-01-16 MED FILL — TOPIRAMATE 50 MG TABLET: 50 | 90 days supply | Qty: 180 | Fill #3

## 2019-02-01 DIAGNOSIS — Z6838 Body mass index (BMI) 38.0-38.9, adult: Secondary | ICD-10-CM | POA: Diagnosis not present

## 2019-02-01 DIAGNOSIS — Z01419 Encounter for gynecological examination (general) (routine) without abnormal findings: Secondary | ICD-10-CM | POA: Diagnosis not present

## 2019-02-21 MED FILL — traMADol HCL 50 MG TABS: 50 | 30 days supply | Qty: 90 | Fill #1

## 2019-02-23 DIAGNOSIS — H524 Presbyopia: Secondary | ICD-10-CM | POA: Diagnosis not present

## 2019-03-03 DIAGNOSIS — M5020 Other cervical disc displacement, unspecified cervical region: Secondary | ICD-10-CM | POA: Diagnosis not present

## 2019-03-03 DIAGNOSIS — M545 Low back pain: Secondary | ICD-10-CM | POA: Diagnosis not present

## 2019-03-03 DIAGNOSIS — M7581 Other shoulder lesions, right shoulder: Secondary | ICD-10-CM | POA: Diagnosis not present

## 2019-03-03 DIAGNOSIS — M4302 Spondylolysis, cervical region: Secondary | ICD-10-CM | POA: Diagnosis not present

## 2019-03-30 MED FILL — TOPIRAMATE 50 MG TABLET: 50 | 90 days supply | Qty: 180 | Fill #0

## 2019-03-30 MED FILL — FUROSEMIDE 20 MG TABS: 20 | 30 days supply | Qty: 30 | Fill #0

## 2019-03-30 MED FILL — METOPROLOL SUCCINATE ER 25: 25 | 90 days supply | Qty: 90 | Fill #1

## 2019-03-30 MED FILL — OMEPRAZOLE 20 MG CAPSULE DR: 20 | 60 days supply | Qty: 120 | Fill #1

## 2019-03-30 MED FILL — traMADol HCL 50 MG TABS: 50 | 30 days supply | Qty: 90 | Fill #2

## 2019-03-30 MED FILL — ATORVASTATIN 10 MG TABLET: 10 | 90 days supply | Qty: 90 | Fill #1

## 2019-03-30 MED FILL — SPIRONOLACTONE 25 MG TABS: 25 | 90 days supply | Qty: 180 | Fill #0

## 2019-04-11 DIAGNOSIS — M5020 Other cervical disc displacement, unspecified cervical region: Secondary | ICD-10-CM | POA: Diagnosis not present

## 2019-04-11 DIAGNOSIS — M7581 Other shoulder lesions, right shoulder: Secondary | ICD-10-CM | POA: Diagnosis not present

## 2019-04-11 DIAGNOSIS — M545 Low back pain: Secondary | ICD-10-CM | POA: Diagnosis not present

## 2019-04-11 DIAGNOSIS — M4302 Spondylolysis, cervical region: Secondary | ICD-10-CM | POA: Diagnosis not present

## 2019-04-19 DIAGNOSIS — G43919 Migraine, unspecified, intractable, without status migrainosus: Secondary | ICD-10-CM | POA: Diagnosis not present

## 2019-04-19 DIAGNOSIS — E6609 Other obesity due to excess calories: Secondary | ICD-10-CM | POA: Diagnosis not present

## 2019-04-19 DIAGNOSIS — I10 Essential (primary) hypertension: Secondary | ICD-10-CM | POA: Diagnosis not present

## 2019-04-19 DIAGNOSIS — E782 Mixed hyperlipidemia: Secondary | ICD-10-CM | POA: Diagnosis not present

## 2019-04-19 DIAGNOSIS — K219 Gastro-esophageal reflux disease without esophagitis: Secondary | ICD-10-CM | POA: Diagnosis not present

## 2019-04-19 MED FILL — CYCLOBENZAPRINE 10 MG TAB: 10 | 30 days supply | Qty: 30 | Fill #0

## 2019-04-26 DIAGNOSIS — E785 Hyperlipidemia, unspecified: Secondary | ICD-10-CM | POA: Diagnosis not present

## 2019-05-24 MED FILL — traMADol HCL 50 MG TABS: 50 | 30 days supply | Qty: 90 | Fill #0

## 2019-05-26 DIAGNOSIS — M5020 Other cervical disc displacement, unspecified cervical region: Secondary | ICD-10-CM | POA: Diagnosis not present

## 2019-05-26 DIAGNOSIS — M545 Low back pain: Secondary | ICD-10-CM | POA: Diagnosis not present

## 2019-05-26 DIAGNOSIS — M7581 Other shoulder lesions, right shoulder: Secondary | ICD-10-CM | POA: Diagnosis not present

## 2019-05-26 DIAGNOSIS — M4302 Spondylolysis, cervical region: Secondary | ICD-10-CM | POA: Diagnosis not present

## 2019-06-21 DIAGNOSIS — E782 Mixed hyperlipidemia: Secondary | ICD-10-CM | POA: Diagnosis not present

## 2019-06-21 DIAGNOSIS — Z0000 Encounter for general adult medical examination without abnormal findings: Secondary | ICD-10-CM | POA: Diagnosis not present

## 2019-06-21 DIAGNOSIS — E6609 Other obesity due to excess calories: Secondary | ICD-10-CM | POA: Diagnosis not present

## 2019-06-21 DIAGNOSIS — Z79899 Other long term (current) drug therapy: Secondary | ICD-10-CM | POA: Diagnosis not present

## 2019-06-21 DIAGNOSIS — K219 Gastro-esophageal reflux disease without esophagitis: Secondary | ICD-10-CM | POA: Diagnosis not present

## 2019-06-21 DIAGNOSIS — M75100 Unspecified rotator cuff tear or rupture of unspecified shoulder, not specified as traumatic: Secondary | ICD-10-CM | POA: Diagnosis not present

## 2019-06-21 DIAGNOSIS — E785 Hyperlipidemia, unspecified: Secondary | ICD-10-CM | POA: Diagnosis not present

## 2019-06-21 DIAGNOSIS — I10 Essential (primary) hypertension: Secondary | ICD-10-CM | POA: Diagnosis not present

## 2019-06-21 DIAGNOSIS — G43919 Migraine, unspecified, intractable, without status migrainosus: Secondary | ICD-10-CM | POA: Diagnosis not present

## 2019-06-26 MED FILL — METOPROLOL SUCCINATE ER 25: 25 | 90 days supply | Qty: 90 | Fill #2

## 2019-06-26 MED FILL — TOPIRAMATE 50 MG TABLET: 50 | 90 days supply | Qty: 180 | Fill #1

## 2019-06-26 MED FILL — SPIRONOLACTONE 25 MG TABS: 25 | 90 days supply | Qty: 180 | Fill #1

## 2019-06-26 MED FILL — ATORVASTATIN 10 MG TABLET: 10 | 90 days supply | Qty: 90 | Fill #2

## 2019-06-26 MED FILL — OMEPRAZOLE DR 20 MG CAPSULE: 20 | 60 days supply | Qty: 120 | Fill #2

## 2019-06-26 MED FILL — FUROSEMIDE 20 MG TABS: 20 | 30 days supply | Qty: 30 | Fill #1

## 2019-07-04 MED FILL — traMADol HCL 50 MG TABS: 50 | 30 days supply | Qty: 90 | Fill #1

## 2019-09-22 MED FILL — METOPROLOL SUCCINATE ER 25: 25 | 90 days supply | Qty: 90 | Fill #3

## 2019-09-22 MED FILL — ATORVASTATIN CALCIUM 10 MG: 10 | 90 days supply | Qty: 90 | Fill #3

## 2019-09-22 MED FILL — TOPIRAMATE 50 MG TABLET: 50 | 90 days supply | Qty: 180 | Fill #2

## 2019-10-11 MED FILL — SPIRONOLACTONE 25 MG TABS: 25 | 90 days supply | Qty: 180 | Fill #2

## 2019-10-12 MED FILL — traMADol HCL 50 MG TABS: 50 | 30 days supply | Qty: 90 | Fill #0

## 2019-12-01 ENCOUNTER — Other Ambulatory Visit (HOSPITAL_COMMUNITY): Payer: Self-pay | Admitting: Internal Medicine

## 2019-12-01 MED FILL — traMADol HCL 50 MG TABS: 50 | 30 days supply | Qty: 90 | Fill #1

## 2019-12-01 MED FILL — OMEPRAZOLE DR 20 MG CAPSULE: 20 | 90 days supply | Qty: 90 | Fill #0

## 2019-12-26 ENCOUNTER — Other Ambulatory Visit (HOSPITAL_COMMUNITY): Payer: Self-pay | Admitting: Internal Medicine

## 2019-12-26 DIAGNOSIS — M940 Chondrocostal junction syndrome [Tietze]: Secondary | ICD-10-CM | POA: Diagnosis not present

## 2019-12-26 DIAGNOSIS — E782 Mixed hyperlipidemia: Secondary | ICD-10-CM | POA: Diagnosis not present

## 2019-12-26 DIAGNOSIS — I10 Essential (primary) hypertension: Secondary | ICD-10-CM | POA: Diagnosis not present

## 2019-12-26 MED FILL — METOPROLOL SUCCINATE ER 25: 25 | 90 days supply | Qty: 90 | Fill #0

## 2019-12-26 MED FILL — ATORVASTATIN CALCIUM 10 MG: 10 | 90 days supply | Qty: 90 | Fill #0

## 2020-01-08 MED FILL — traMADol HCL 50 MG TABS: 50 | 30 days supply | Qty: 90 | Fill #2

## 2020-01-08 MED FILL — TOPIRAMATE 50 MG TABLET: 50 | 90 days supply | Qty: 180 | Fill #3

## 2020-01-08 MED FILL — SPIRONOLACTONE 25 MG TABS: 25 | 90 days supply | Qty: 180 | Fill #3

## 2020-01-08 MED FILL — OMEPRAZOLE DR 20 MG CAPSULE: 20 | 90 days supply | Qty: 90 | Fill #0

## 2020-03-28 ENCOUNTER — Other Ambulatory Visit (HOSPITAL_COMMUNITY): Payer: Self-pay | Admitting: Internal Medicine

## 2020-03-28 MED FILL — ATORVASTATIN CALCIUM 10 MG: 10 | 90 days supply | Qty: 90 | Fill #1

## 2020-03-28 MED FILL — traMADol HCL 50 MG TABS: 50 | 30 days supply | Qty: 90 | Fill #0

## 2020-03-28 MED FILL — OMEPRAZOLE DR 20 MG CAPSULE: 20 | 90 days supply | Qty: 90 | Fill #1

## 2020-03-28 MED FILL — METOPROLOL SUCCINATE ER 25: 25 | 90 days supply | Qty: 90 | Fill #1

## 2020-04-10 ENCOUNTER — Other Ambulatory Visit (HOSPITAL_COMMUNITY): Payer: Self-pay | Admitting: Internal Medicine

## 2020-04-10 MED FILL — TOPIRAMATE 50 MG TABLET: 50 | 90 days supply | Qty: 180 | Fill #0

## 2020-04-10 MED FILL — SPIRONOLACTONE 25 MG TABS: 25 | 90 days supply | Qty: 180 | Fill #0

## 2020-04-10 MED FILL — FUROSEMIDE 20 MG TABS: 20 | 30 days supply | Qty: 30 | Fill #0

## 2020-05-08 ENCOUNTER — Other Ambulatory Visit (HOSPITAL_COMMUNITY): Payer: Self-pay | Admitting: Internal Medicine

## 2020-05-08 MED FILL — traMADol HCL 50 MG TABS: 50 | 30 days supply | Qty: 90 | Fill #0

## 2020-06-24 ENCOUNTER — Other Ambulatory Visit (HOSPITAL_COMMUNITY): Payer: Self-pay | Admitting: Internal Medicine

## 2020-06-24 MED FILL — OMEPRAZOLE DR 20 MG CAPSULE: 20 | 90 days supply | Qty: 90 | Fill #2

## 2020-06-24 MED FILL — ATORVASTATIN CALCIUM 10 MG: 10 | 90 days supply | Qty: 90 | Fill #2

## 2020-06-24 MED FILL — METOPROLOL SUCCINATE ER 25: 25 | 90 days supply | Qty: 90 | Fill #2

## 2020-06-24 MED FILL — traMADol HCL 50 MG TABS: 50 | 30 days supply | Qty: 30 | Fill #0

## 2020-07-08 MED FILL — TOPIRAMATE 50 MG TABLET: 50 | 90 days supply | Qty: 180 | Fill #1

## 2020-07-08 MED FILL — SPIRONOLACTONE 25 MG TABS: 25 | 90 days supply | Qty: 180 | Fill #1

## 2020-07-10 DIAGNOSIS — M75102 Unspecified rotator cuff tear or rupture of left shoulder, not specified as traumatic: Secondary | ICD-10-CM | POA: Diagnosis not present

## 2020-07-10 DIAGNOSIS — I10 Essential (primary) hypertension: Secondary | ICD-10-CM | POA: Diagnosis not present

## 2020-07-10 DIAGNOSIS — Z79899 Other long term (current) drug therapy: Secondary | ICD-10-CM | POA: Diagnosis not present

## 2020-07-10 DIAGNOSIS — G43919 Migraine, unspecified, intractable, without status migrainosus: Secondary | ICD-10-CM | POA: Diagnosis not present

## 2020-07-10 DIAGNOSIS — E6609 Other obesity due to excess calories: Secondary | ICD-10-CM | POA: Diagnosis not present

## 2020-07-10 DIAGNOSIS — E782 Mixed hyperlipidemia: Secondary | ICD-10-CM | POA: Diagnosis not present

## 2020-07-10 DIAGNOSIS — K219 Gastro-esophageal reflux disease without esophagitis: Secondary | ICD-10-CM | POA: Diagnosis not present

## 2020-07-10 DIAGNOSIS — E559 Vitamin D deficiency, unspecified: Secondary | ICD-10-CM | POA: Diagnosis not present

## 2020-07-10 DIAGNOSIS — Z0000 Encounter for general adult medical examination without abnormal findings: Secondary | ICD-10-CM | POA: Diagnosis not present
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
## 2020-09-25 ENCOUNTER — Other Ambulatory Visit (HOSPITAL_COMMUNITY): Payer: Self-pay

## 2020-09-25 MED FILL — Atorvastatin Calcium Tab 10 MG (Base Equivalent): ORAL | 90 days supply | Qty: 90 | Fill #0 | Status: AC

## 2020-09-25 MED FILL — Furosemide Tab 20 MG: ORAL | 30 days supply | Qty: 30 | Fill #0 | Status: AC

## 2020-09-25 MED FILL — Metoprolol Succinate Tab ER 24HR 25 MG (Tartrate Equiv): ORAL | 90 days supply | Qty: 90 | Fill #0 | Status: AC

## 2020-09-25 MED FILL — Omeprazole Cap Delayed Release 20 MG: ORAL | 90 days supply | Qty: 90 | Fill #0 | Status: AC

## 2020-10-01 ENCOUNTER — Other Ambulatory Visit (HOSPITAL_COMMUNITY): Payer: Self-pay

## 2020-10-04 ENCOUNTER — Other Ambulatory Visit (HOSPITAL_COMMUNITY): Payer: Self-pay

## 2020-10-04 MED FILL — Spironolactone Tab 25 MG: ORAL | 90 days supply | Qty: 180 | Fill #0 | Status: AC

## 2020-10-04 MED FILL — Topiramate Tab 50 MG: ORAL | 90 days supply | Qty: 180 | Fill #0 | Status: AC

## 2020-10-24 ENCOUNTER — Other Ambulatory Visit (HOSPITAL_COMMUNITY): Payer: Self-pay

## 2020-10-24 MED ORDER — DEXAMETHASONE 4 MG PO TABS
ORAL_TABLET | ORAL | 0 refills | Status: AC
  Filled 2020-10-24: qty 5, 3d supply, fill #0

## 2020-10-24 MED ORDER — CHLORHEXIDINE GLUCONATE 0.12 % MT SOLN
OROMUCOSAL | 0 refills | Status: DC
  Filled 2020-10-24: qty 946, 32d supply, fill #0

## 2020-10-24 MED ORDER — IBUPROFEN 600 MG PO TABS
600.0000 mg | ORAL_TABLET | Freq: Four times a day (QID) | ORAL | 0 refills | Status: DC | PRN
  Filled 2020-10-24: qty 20, 5d supply, fill #0

## 2020-10-24 MED ORDER — AMOXICILLIN 875 MG PO TABS
875.0000 mg | ORAL_TABLET | Freq: Two times a day (BID) | ORAL | 0 refills | Status: DC
  Filled 2020-10-24: qty 14, 7d supply, fill #0

## 2020-12-05 ENCOUNTER — Other Ambulatory Visit (HOSPITAL_COMMUNITY): Payer: Self-pay

## 2020-12-05 MED ORDER — TRAMADOL HCL 50 MG PO TABS
50.0000 mg | ORAL_TABLET | Freq: Three times a day (TID) | ORAL | 2 refills | Status: DC | PRN
  Filled 2020-12-05: qty 90, 30d supply, fill #0
  Filled 2021-03-10: qty 90, 30d supply, fill #1
  Filled 2021-05-02: qty 90, 30d supply, fill #2

## 2020-12-10 ENCOUNTER — Other Ambulatory Visit (HOSPITAL_COMMUNITY): Payer: Self-pay

## 2020-12-10 MED ORDER — ATORVASTATIN CALCIUM 10 MG PO TABS
10.0000 mg | ORAL_TABLET | Freq: Every day | ORAL | 3 refills | Status: DC
  Filled 2020-12-10: qty 90, 90d supply, fill #0
  Filled 2021-03-24: qty 90, 90d supply, fill #1
  Filled 2021-06-23: qty 90, 90d supply, fill #2
  Filled 2021-09-19: qty 90, 90d supply, fill #3

## 2020-12-10 MED ORDER — OMEPRAZOLE 20 MG PO CPDR
20.0000 mg | DELAYED_RELEASE_CAPSULE | Freq: Two times a day (BID) | ORAL | 3 refills | Status: DC
  Filled 2020-12-10 (×2): qty 180, 90d supply, fill #0
  Filled 2021-03-31: qty 180, 90d supply, fill #1
  Filled 2021-09-19: qty 180, 90d supply, fill #2

## 2020-12-10 MED ORDER — METOPROLOL SUCCINATE ER 25 MG PO TB24
25.0000 mg | ORAL_TABLET | Freq: Every day | ORAL | 3 refills | Status: DC
  Filled 2020-12-10: qty 90, 90d supply, fill #0
  Filled 2021-03-24: qty 90, 90d supply, fill #1
  Filled 2021-06-23: qty 90, 90d supply, fill #2
  Filled 2021-09-19: qty 90, 90d supply, fill #3

## 2020-12-11 ENCOUNTER — Other Ambulatory Visit (HOSPITAL_COMMUNITY): Payer: Self-pay

## 2020-12-31 ENCOUNTER — Other Ambulatory Visit (HOSPITAL_COMMUNITY): Payer: Self-pay

## 2020-12-31 MED ORDER — HYDROCODONE BIT-HOMATROP MBR 5-1.5 MG/5ML PO SOLN
5.0000 mL | Freq: Two times a day (BID) | ORAL | 0 refills | Status: DC | PRN
  Filled 2020-12-31: qty 100, 10d supply, fill #0

## 2020-12-31 MED ORDER — PREDNISONE 5 MG PO TABS
ORAL_TABLET | ORAL | 0 refills | Status: DC
  Filled 2020-12-31: qty 30, 10d supply, fill #0

## 2020-12-31 MED ORDER — BENZONATATE 100 MG PO CAPS
100.0000 mg | ORAL_CAPSULE | Freq: Three times a day (TID) | ORAL | 0 refills | Status: DC | PRN
  Filled 2020-12-31: qty 30, 10d supply, fill #0

## 2021-01-03 ENCOUNTER — Other Ambulatory Visit (HOSPITAL_COMMUNITY): Payer: Self-pay

## 2021-01-03 MED FILL — Topiramate Tab 50 MG: ORAL | 90 days supply | Qty: 180 | Fill #1 | Status: CN

## 2021-01-03 MED FILL — Spironolactone Tab 25 MG: ORAL | 90 days supply | Qty: 180 | Fill #1 | Status: CN

## 2021-01-04 ENCOUNTER — Other Ambulatory Visit (HOSPITAL_COMMUNITY): Payer: Self-pay

## 2021-01-04 MED FILL — Spironolactone Tab 25 MG: ORAL | 90 days supply | Qty: 180 | Fill #1 | Status: AC

## 2021-01-04 MED FILL — Topiramate Tab 50 MG: ORAL | 90 days supply | Qty: 180 | Fill #1 | Status: AC

## 2021-01-06 ENCOUNTER — Other Ambulatory Visit (HOSPITAL_COMMUNITY): Payer: Self-pay

## 2021-01-13 ENCOUNTER — Other Ambulatory Visit (HOSPITAL_COMMUNITY): Payer: Self-pay

## 2021-01-13 DIAGNOSIS — M75100 Unspecified rotator cuff tear or rupture of unspecified shoulder, not specified as traumatic: Secondary | ICD-10-CM | POA: Diagnosis not present

## 2021-01-13 DIAGNOSIS — I10 Essential (primary) hypertension: Secondary | ICD-10-CM | POA: Diagnosis not present

## 2021-01-13 MED ORDER — PHENTERMINE HCL 15 MG PO CAPS
15.0000 mg | ORAL_CAPSULE | Freq: Every day | ORAL | 5 refills | Status: DC
  Filled 2021-01-13: qty 30, 30d supply, fill #0
  Filled 2021-02-24: qty 30, 30d supply, fill #1
  Filled 2021-03-31: qty 30, 30d supply, fill #2

## 2021-01-13 MED ORDER — FUROSEMIDE 20 MG PO TABS
20.0000 mg | ORAL_TABLET | Freq: Every day | ORAL | 3 refills | Status: DC
  Filled 2021-01-13: qty 70, 90d supply, fill #0
  Filled 2021-05-02: qty 70, 90d supply, fill #1

## 2021-01-22 ENCOUNTER — Other Ambulatory Visit (HOSPITAL_COMMUNITY): Payer: Self-pay

## 2021-01-22 DIAGNOSIS — M25512 Pain in left shoulder: Secondary | ICD-10-CM | POA: Diagnosis not present

## 2021-01-22 DIAGNOSIS — S46002A Unspecified injury of muscle(s) and tendon(s) of the rotator cuff of left shoulder, initial encounter: Secondary | ICD-10-CM | POA: Diagnosis not present

## 2021-02-05 DIAGNOSIS — M25512 Pain in left shoulder: Secondary | ICD-10-CM | POA: Diagnosis not present

## 2021-02-17 DIAGNOSIS — M75122 Complete rotator cuff tear or rupture of left shoulder, not specified as traumatic: Secondary | ICD-10-CM | POA: Diagnosis not present

## 2021-02-19 ENCOUNTER — Other Ambulatory Visit (HOSPITAL_COMMUNITY): Payer: Self-pay

## 2021-02-24 ENCOUNTER — Other Ambulatory Visit (HOSPITAL_COMMUNITY): Payer: Self-pay

## 2021-02-24 DIAGNOSIS — M75100 Unspecified rotator cuff tear or rupture of unspecified shoulder, not specified as traumatic: Secondary | ICD-10-CM | POA: Diagnosis not present

## 2021-02-24 DIAGNOSIS — I10 Essential (primary) hypertension: Secondary | ICD-10-CM | POA: Diagnosis not present

## 2021-03-10 ENCOUNTER — Other Ambulatory Visit (HOSPITAL_COMMUNITY): Payer: Self-pay

## 2021-03-24 ENCOUNTER — Other Ambulatory Visit (HOSPITAL_COMMUNITY): Payer: Self-pay

## 2021-03-31 ENCOUNTER — Other Ambulatory Visit (HOSPITAL_COMMUNITY): Payer: Self-pay

## 2021-03-31 MED ORDER — TOPIRAMATE 50 MG PO TABS
50.0000 mg | ORAL_TABLET | Freq: Two times a day (BID) | ORAL | 4 refills | Status: DC
  Filled 2021-03-31: qty 180, 90d supply, fill #0
  Filled 2021-07-03: qty 180, 90d supply, fill #1
  Filled 2021-10-01: qty 180, 90d supply, fill #2
  Filled 2021-12-30: qty 180, 90d supply, fill #3
  Filled 2022-03-26: qty 180, 90d supply, fill #4

## 2021-04-04 ENCOUNTER — Other Ambulatory Visit (HOSPITAL_COMMUNITY): Payer: Self-pay

## 2021-04-07 NOTE — Progress Notes (Signed)
Please place orders for PAT appointment scheduled 04/09/21. °

## 2021-04-07 NOTE — Patient Instructions (Addendum)
DUE TO COVID-19 ONLY ONE VISITOR IS ALLOWED TO COME WITH YOU AND STAY IN THE WAITING ROOM ONLY DURING PRE OP AND PROCEDURE.   **NO VISITORS ARE ALLOWED IN THE SHORT STAY AREA OR RECOVERY ROOM!!**       Your procedure is scheduled on: 04/15/21   Report to [facility] Main Entrance    Report to admitting at 12:45 PM   Call this number if you have problems the morning of surgery [phone]   Do not eat food :After Midnight.   May have liquids until  12:00 PM  day of surgery  CLEAR LIQUID DIET  Foods Allowed                                                                     Foods Excluded  Water, Black Coffee and tea (no milk or creamer)          liquids that you cannot  Plain Jell-O in any flavor  (No red)                                   see through such as: Fruit ices (not with fruit pulp)                                           milk, soups, orange juice              Iced Popsicles (No red)                                               All solid food                                   Apple juices Sports drinks like Gatorade (No red) Lightly seasoned clear broth or consume(fat free) Sugar   Oral Hygiene is also important to reduce your risk of infection.                                    Remember - BRUSH YOUR TEETH THE MORNING OF SURGERY WITH YOUR REGULAR TOOTHPASTE   Take these medicines the morning of surgery with A SIP OF WATER: Lipitor, Metoprolol, Omeprazole, Topamax, Tramadol.                               You may not have any metal on your body including hair pins, jewelry, and body piercing             Do not wear make-up, lotions, powders, perfumes, or deodorant  Do not wear nail polish including gel and S&S, artificial/acrylic nails, or any other type of covering on natural nails including finger and toenails. If you have artificial nails, gel coating, etc. that needs  to be removed by a nail salon please have this removed prior to surgery or surgery may  need to be canceled/ delayed if the surgeon/ anesthesia feels like they are unable to be safely monitored.   Do not shave  48 hours prior to surgery.    Do not bring valuables to the hospital. [facility] IS NOT             RESPONSIBLE   FOR VALUABLES.   Contacts, dentures or bridgework may not be worn into surgery.    Patients discharged on the day of surgery will not be allowed to drive home.              Please read over the following fact sheets you were given: IF YOU HAVE QUESTIONS ABOUT YOUR PRE-OP INSTRUCTIONS PLEASE CALL [phone]- [facility] - Preparing for Surgery Before surgery, you can play an important role.  Because skin is not sterile, your skin needs to be as free of germs as possible.  You can reduce the number of germs on your skin by washing with CHG (chlorahexidine gluconate) soap before surgery.  CHG is an antiseptic cleaner which kills germs and bonds with the skin to continue killing germs even after washing. Please DO NOT use if you have an allergy to CHG or antibacterial soaps.  If your skin becomes reddened/irritated stop using the CHG and inform your nurse when you arrive at Short Stay. Do not shave (including legs and underarms) for at least 48 hours prior to the first CHG shower.  You may shave your face/neck.  Please follow these instructions carefully:  1.  Shower with CHG Soap the night before surgery and the  morning of surgery.  2.  If you choose to wash your hair, wash your hair first as usual with your normal  shampoo.  3.  After you shampoo, rinse your hair and body thoroughly to remove the shampoo.                             4.  Use CHG as you would any other liquid soap.  You can apply chg directly to the skin and wash.  Gently with a scrungie or clean washcloth.  5.  Apply the CHG Soap to your body ONLY FROM THE NECK DOWN.   Do   not use on face/ open                           Wound or open sores. Avoid contact with eyes, ears mouth and    genitals (private parts).                       Wash face,  Genitals (private parts) with your normal soap.             6.  Wash thoroughly, paying special attention to the area where your    surgery  will be performed.  7.  Thoroughly rinse your body with warm water from the neck down.  8.  DO NOT shower/wash with your normal soap after using and rinsing off the CHG Soap.                9.  Pat yourself dry with a clean towel.            10.  Wear clean pajamas.  11.  Place clean sheets on your bed the night of your first shower and do not  sleep with pets. Day of Surgery : Do not apply any lotions/deodorants the morning of surgery.  Please wear clean clothes to the hospital/surgery center.  FAILURE TO FOLLOW THESE INSTRUCTIONS MAY RESULT IN THE CANCELLATION OF YOUR SURGERY  PATIENT SIGNATURE_________________________________  NURSE SIGNATURE__________________________________  ________________________________________________________________________

## 2021-04-07 NOTE — Progress Notes (Addendum)
COVID swab appointment: n/a  COVID Vaccine Completed: yes x2 Date COVID Vaccine completed: Has received booster: COVID vaccine manufacturer: Pfizer    [employer]   Date of COVID positive in last 90 days: 12/25/20  PCP - Marden Noble, MD Cardiologist - n/a  Chest x-ray - n/a EKG - 04/09/21 Epic/chart Stress Test - n/a ECHO - yes with Dr. Kevan Ny years ago Cardiac Cath - n/a Pacemaker/ICD device last checked: n/a Spinal Cord Stimulator: n/a  Sleep Study - n/a CPAP -   Fasting Blood Sugar - n/a Checks Blood Sugar _____ times a day  Blood Thinner Instructions: n/a Aspirin Instructions: Last Dose:  Activity level: Can go up a flight of stairs and perform activities of daily living without stopping and without symptoms of chest pain or shortness of breath.     Anesthesia review:   Patient denies shortness of breath, fever, cough and chest pain at PAT appointment   Patient verbalized understanding of instructions that were given to them at the PAT appointment. Patient was also instructed that they will need to review over the PAT instructions again at home before surgery.

## 2021-04-07 NOTE — H&P (View-Only) (Signed)
COVID swab appointment: n/a  COVID Vaccine Completed: yes x2 Date COVID Vaccine completed: Has received booster: COVID vaccine manufacturer: Pfizer    [employer]   Date of COVID positive in last 90 days: 12/25/20  PCP - Marden Noble, MD Cardiologist - n/a  Chest x-ray - n/a EKG - 04/09/21 Epic/chart Stress Test - n/a ECHO - yes with Dr. Kevan Ny years ago Cardiac Cath - n/a Pacemaker/ICD device last checked: n/a Spinal Cord Stimulator: n/a  Sleep Study - n/a CPAP -   Fasting Blood Sugar - n/a Checks Blood Sugar _____ times a day  Blood Thinner Instructions: n/a Aspirin Instructions: Last Dose:  Activity level: Can go up a flight of stairs and perform activities of daily living without stopping and without symptoms of chest pain or shortness of breath.     Anesthesia review:   Patient denies shortness of breath, fever, cough and chest pain at PAT appointment   Patient verbalized understanding of instructions that were given to them at the PAT appointment. Patient was also instructed that they will need to review over the PAT instructions again at home before surgery.

## 2021-04-09 ENCOUNTER — Encounter (HOSPITAL_COMMUNITY): Payer: Self-pay

## 2021-04-09 ENCOUNTER — Encounter (HOSPITAL_COMMUNITY)
Admission: RE | Admit: 2021-04-09 | Discharge: 2021-04-09 | Disposition: A | Discharge status: Home / Self Care | Payer: 59 | Source: Ambulatory Visit | Attending: Specialist | Admitting: Specialist

## 2021-04-09 ENCOUNTER — Other Ambulatory Visit: Payer: Self-pay

## 2021-04-09 VITALS — BP 154/87 | HR 87 | Temp 98.6°F | Ht 59.0 in | Wt 198.4 lb

## 2021-04-09 DIAGNOSIS — Z01818 Encounter for other preprocedural examination: Secondary | ICD-10-CM | POA: Insufficient documentation

## 2021-04-09 DIAGNOSIS — I2510 Atherosclerotic heart disease of native coronary artery without angina pectoris: Secondary | ICD-10-CM | POA: Insufficient documentation

## 2021-04-09 LAB — BASIC METABOLIC PANEL
Anion gap: 8 (ref 5–15)
BUN: 16 mg/dL (ref 8–23)
CO2: 26 mmol/L (ref 22–32)
Calcium: 9.2 mg/dL (ref 8.9–10.3)
Chloride: 110 mmol/L (ref 98–111)
Creatinine, Ser: 0.97 mg/dL (ref 0.44–1.00)
GFR, Estimated: >60 mL/min (ref >60)
Glucose, Bld: 104 mg/dL — ABNORMAL HIGH (ref 70–99)
Potassium: 3.7 mmol/L (ref 3.5–5.1)
Sodium: 144 mmol/L (ref 135–145)

## 2021-04-09 LAB — CBC
HCT: 45.1 % (ref 36.0–46.0)
Hemoglobin: 14.6 g/dL (ref 12.0–15.0)
MCH: 31.1 pg (ref 26.0–34.0)
MCHC: 32.4 g/dL (ref 30.0–36.0)
MCV: 96.2 fL (ref 80.0–100.0)
Platelets: 194 10*3/uL (ref 150–400)
RBC: 4.69 MIL/uL (ref 3.87–5.11)
RDW: 12.7 % (ref 11.5–15.5)
WBC: 6.7 10*3/uL (ref 4.0–10.5)
nRBC: 0 % (ref 0.0–0.2)

## 2021-04-14 NOTE — H&P (Signed)
Vicki Krueger is an 70 y.o. female.   Chief Complaint: Left shoulder pain HPI: This is a very pleasant right-hand-dominant 70 year old female who was diagnosed with a left shoulder rotator cuff tear.  Patient has been having pain ongoing for about 6 to 7 months now.  She has tried conservative management with a cortisone injection and rest with no relief.  She states that the pain is getting worse is keeping her up at nighttime.  An MRI scan was done that showed a rotator cuff tear and impingement.  Past Medical History:  Diagnosis Date   Acute meniscal tear of knee RIGHT   Arthritis    Cataract immature    Esophageal stricture    GERD (gastroesophageal reflux disease)    H/O cardiac murmur    H/O hiatal hernia    Heart murmur    Hepatitis 2006   treatment x 1 year no problem now   History of esophageal dilatation    History of hepatitis C 2006  PER BLOOD TEST -- TX FOR 1 YR--  NO SYMPTOMS SINCE--  POSS. DUE TO NEEDLE STICK  (PT A NURSE)   History of meningitis X4  LAST HAD IN 2007   Hypertension    Intermittent palpitations 07-27-13   occasional-tx. Metoprolol as needed   Left knee pain    Swelling of right knee joint     Past Surgical History:  Procedure Laterality Date   APPENDECTOMY     CESAREAN SECTION  04/21/1987   COLONOSCOPY WITH PROPOFOL N/A 08/15/2013   Procedure: COLONOSCOPY WITH PROPOFOL;  Surgeon: Garlan Fair, MD;  Location: [facility];  Service: Endoscopy;  Laterality: N/A;   DILATION AND CURETTAGE OF UTERUS     DX LAPAROSCOPY  GYN   ESOPHAGEAL MANOMETRY N/A 09/25/2013   Procedure: ESOPHAGEAL MANOMETRY (EM);  Surgeon: Garlan Fair, MD;  Location: [facility];  Service: Endoscopy;  Laterality: N/A;   ESOPHAGOGASTRODUODENOSCOPY (EGD) WITH PROPOFOL N/A 08/15/2013   Procedure: ESOPHAGOGASTRODUODENOSCOPY (EGD) WITH PROPOFOL;  Surgeon: Garlan Fair, MD;  Location: [facility];  Service: Endoscopy;  Laterality: N/A;   HYSTEROSCOPY WITH D & C  07/20/2002    JOINT REPLACEMENT     s/p RTKA   KNEE ARTHROSCOPY  06/11/2011   Procedure: ARTHROSCOPY KNEE;  Surgeon: Cynda Familia, MD;  Location: [facility];  Service: Orthopedics;;  with debridement  Local with MAC   LAPAROSCOPIC CHOLECYSTECTOMY  08/19/2002   LEFT KNEE ARTHROSCOPY  X2  LAST ONE 2000   TONSILLECTOMY AND ADENOIDECTOMY  CHILD   TOTAL KNEE ARTHROPLASTY Right 08/10/2012   Procedure: TOTAL KNEE ARTHROPLASTY;  Surgeon: Gearlean Alf, MD;  Location: [facility];  Service: Orthopedics;  Laterality: Right;   TUBAL LIGATION      No family history on file. Social History:  reports that she has never smoked. She has never used smokeless tobacco. She reports that she does not drink alcohol and does not use drugs.  Allergies:  Allergies  Allergen Reactions   Aspirin Other (See Comments)    GI UPSET   Other Other (See Comments)    Actifed - hyper   Pseudoephedrine Other (See Comments)    "HYPER"   Stadol [Butorphanol]     Hypotension    Doxycycline Nausea Only   Morphine And Related Rash    No medications prior to admission.    No results found for this or any previous visit (from the past 48 hour(s)). No results found.  Review of Systems  All other systems reviewed and are negative.  There were no vitals taken for this visit. Physical Exam Constitutional:      Appearance: Normal appearance.  HENT:     Head: Normocephalic and atraumatic.  Eyes:     Extraocular Movements: Extraocular movements intact.  Musculoskeletal:     Comments: On exam of her left shoulder no skin changes or effusions noted. She has tenderness to palpation over the anterior lateral aspect of the shoulder. She has pain with range of motion past about 90 of forward flexion as well as 80 of abduction. She has 4-5 strength with resisted forward flexion resisted abduction, 5 out of 5 strength resisted internal and external rotation. Pain with impingement testing, pain with supraspinatus  testing as well as weakness with supraspinatus testing. Full range of motion of her left elbow. Neurovascularly intact in her left upper extremity.  Skin:    Capillary Refill: Capillary refill takes less than 2 seconds.  Neurological:     General: No focal deficit present.     Mental Status: She is alert and oriented to person, place, and time.  Psychiatric:        Mood and Affect: Mood normal.        Behavior: Behavior normal.        Thought Content: Thought content normal.        Judgment: Judgment normal.   MRI of the left shoulder reveals she has chronic impingement syndrome. Rotator cuff tears superiorly, partial biceps, subscap tendinosis, biceps tendinosis, no subluxation, glenohumeral some degenerative change, but not end stage, possible loose bodies, ACL away and a lateral acromial spur.    Assessment/Plan Left shoulder chronic impingement syndrome, rotator cuff tears: Patient has been dealing with this for a while now, has failed conservative management.  It was discussed with her surgical versus nonsurgical management.  She is already failed nonsurgical management and would like to proceed with surgical management at this time.  She will be having a left shoulder arthroscopy subacromial decompression, distal clavicle resection, rotator cuff repair, biceps tenotomy and removal of loose bodies.  All this was discussed with the patient today.  Risks and benefits of surgery were discussed with the patient.  Risks include infection, bleeding, damage to ligaments tendons blood vessels nerves and bones.  She is in agreement to all of this.  She has elected to proceed.  All questions encouraged and answered for the patient today.  Drue Novel, PA 04/14/2021, 7:30 AM

## 2021-04-15 ENCOUNTER — Other Ambulatory Visit (HOSPITAL_COMMUNITY): Payer: Self-pay

## 2021-04-15 ENCOUNTER — Ambulatory Visit (HOSPITAL_COMMUNITY): Payer: 59 | Admitting: Anesthesiology

## 2021-04-15 ENCOUNTER — Encounter (HOSPITAL_COMMUNITY): Payer: Self-pay | Admitting: Specialist

## 2021-04-15 ENCOUNTER — Ambulatory Visit (HOSPITAL_COMMUNITY)
Admission: RE | Admit: 2021-04-15 | Discharge: 2021-04-15 | Disposition: A | Discharge status: Home / Self Care | Payer: 59 | Attending: Specialist | Admitting: Specialist

## 2021-04-15 ENCOUNTER — Encounter (HOSPITAL_COMMUNITY)
Admission: RE | Disposition: A | Discharge status: Home / Self Care | Payer: Self-pay | Source: Home / Self Care | Attending: Specialist

## 2021-04-15 DIAGNOSIS — M94212 Chondromalacia, left shoulder: Secondary | ICD-10-CM | POA: Diagnosis not present

## 2021-04-15 DIAGNOSIS — M24012 Loose body in left shoulder: Secondary | ICD-10-CM | POA: Diagnosis not present

## 2021-04-15 DIAGNOSIS — M19012 Primary osteoarthritis, left shoulder: Secondary | ICD-10-CM | POA: Diagnosis not present

## 2021-04-15 DIAGNOSIS — S46212A Strain of muscle, fascia and tendon of other parts of biceps, left arm, initial encounter: Secondary | ICD-10-CM | POA: Diagnosis not present

## 2021-04-15 DIAGNOSIS — M75102 Unspecified rotator cuff tear or rupture of left shoulder, not specified as traumatic: Secondary | ICD-10-CM | POA: Insufficient documentation

## 2021-04-15 DIAGNOSIS — M24112 Other articular cartilage disorders, left shoulder: Secondary | ICD-10-CM | POA: Diagnosis not present

## 2021-04-15 DIAGNOSIS — M75122 Complete rotator cuff tear or rupture of left shoulder, not specified as traumatic: Secondary | ICD-10-CM | POA: Diagnosis not present

## 2021-04-15 DIAGNOSIS — M7542 Impingement syndrome of left shoulder: Secondary | ICD-10-CM | POA: Insufficient documentation

## 2021-04-15 DIAGNOSIS — M25512 Pain in left shoulder: Secondary | ICD-10-CM | POA: Diagnosis not present

## 2021-04-15 DIAGNOSIS — G8918 Other acute postprocedural pain: Secondary | ICD-10-CM | POA: Diagnosis not present

## 2021-04-15 DIAGNOSIS — I2510 Atherosclerotic heart disease of native coronary artery without angina pectoris: Secondary | ICD-10-CM

## 2021-04-15 DIAGNOSIS — X58XXXA Exposure to other specified factors, initial encounter: Secondary | ICD-10-CM | POA: Insufficient documentation

## 2021-04-15 DIAGNOSIS — M7522 Bicipital tendinitis, left shoulder: Secondary | ICD-10-CM | POA: Diagnosis not present

## 2021-04-15 HISTORY — PX: SHOULDER ARTHROSCOPY WITH ROTATOR CUFF REPAIR: SHX5685

## 2021-04-15 SURGERY — ARTHROSCOPY, SHOULDER, WITH ROTATOR CUFF REPAIR
Anesthesia: General | Site: Shoulder | Laterality: Left

## 2021-04-15 MED ORDER — ONDANSETRON HCL 4 MG/2ML IJ SOLN: 4.0000 mg | Freq: Once | INTRAMUSCULAR | Status: DC | PRN

## 2021-04-15 MED ORDER — OXYCODONE HCL 5 MG/5ML PO SOLN: 5.0000 mg | Freq: Once | ORAL | Status: AC | PRN

## 2021-04-15 MED ORDER — OXYCODONE HCL 5 MG PO TABS: 5.0000 mg | ORAL_TABLET | Freq: Once | ORAL | Status: AC | PRN

## 2021-04-15 MED ORDER — CEFAZOLIN SODIUM-DEXTROSE 2-4 GM/100ML-% IV SOLN
2.0000 g | INTRAVENOUS | Status: AC
  Administered 2021-04-15: 15:00:00 2 g via INTRAVENOUS
  Filled 2021-04-15: qty 100

## 2021-04-15 MED ORDER — DEXAMETHASONE SODIUM PHOSPHATE 10 MG/ML IJ SOLN
INTRAMUSCULAR | Status: AC
  Filled 2021-04-15: qty 1

## 2021-04-15 MED ORDER — PROPOFOL 10 MG/ML IV BOLUS
INTRAVENOUS | Status: DC | PRN
  Administered 2021-04-15: 140 mg via INTRAVENOUS

## 2021-04-15 MED ORDER — CEPHALEXIN 500 MG PO CAPS
500.0000 mg | ORAL_CAPSULE | Freq: Four times a day (QID) | ORAL | 0 refills | Status: AC
  Filled 2021-04-15 – 2021-04-16 (×2): qty 12, 3d supply, fill #0

## 2021-04-15 MED ORDER — LACTATED RINGERS IV SOLN: INTRAVENOUS | Status: DC

## 2021-04-15 MED ORDER — ORAL CARE MOUTH RINSE: 15.0000 mL | Freq: Once | OROMUCOSAL | Status: AC

## 2021-04-15 MED ORDER — OXYCODONE HCL 5 MG PO TABS
5.0000 mg | ORAL_TABLET | ORAL | 0 refills | Status: AC | PRN
Start: 2021-04-15 — End: 2022-04-15
  Filled 2021-04-15: qty 50, 8d supply, fill #0
  Filled 2021-04-16: qty 50, 9d supply, fill #0

## 2021-04-15 MED ORDER — DEXAMETHASONE SODIUM PHOSPHATE 10 MG/ML IJ SOLN
INTRAMUSCULAR | Status: DC | PRN
Start: 2021-04-15 — End: 2021-04-15
  Administered 2021-04-15: 10 mg via INTRAVENOUS

## 2021-04-15 MED ORDER — METHOCARBAMOL 500 MG PO TABS
500.0000 mg | ORAL_TABLET | Freq: Four times a day (QID) | ORAL | 0 refills | Status: DC
  Filled 2021-04-15 – 2021-04-16 (×2): qty 40, 10d supply, fill #0

## 2021-04-15 MED ORDER — ARTIFICIAL TEARS OPHTHALMIC OINT
TOPICAL_OINTMENT | OPHTHALMIC | Status: AC
  Filled 2021-04-15: qty 3.5

## 2021-04-15 MED ORDER — ONDANSETRON HCL 4 MG/2ML IJ SOLN
INTRAMUSCULAR | Status: AC
  Filled 2021-04-15: qty 2

## 2021-04-15 MED ORDER — FENTANYL CITRATE PF 50 MCG/ML IJ SOSY
PREFILLED_SYRINGE | INTRAMUSCULAR | Status: AC
  Administered 2021-04-15: 14:00:00 50 ug via INTRAVENOUS
  Filled 2021-04-15: qty 2

## 2021-04-15 MED ORDER — BUPIVACAINE HCL (PF) 0.5 % IJ SOLN
INTRAMUSCULAR | Status: DC | PRN
  Administered 2021-04-15: 15 mL via PERINEURAL

## 2021-04-15 MED ORDER — ACETAMINOPHEN 500 MG PO TABS
1000.0000 mg | ORAL_TABLET | Freq: Once | ORAL | Status: AC
  Administered 2021-04-15: 14:00:00 1000 mg via ORAL
  Filled 2021-04-15: qty 2

## 2021-04-15 MED ORDER — EPINEPHRINE PF 1 MG/ML IJ SOLN
INTRAMUSCULAR | Status: AC
  Filled 2021-04-15: qty 1

## 2021-04-15 MED ORDER — MIDAZOLAM HCL 2 MG/2ML IJ SOLN: 1.0000 mg | INTRAMUSCULAR | Status: DC

## 2021-04-15 MED ORDER — SUGAMMADEX SODIUM 200 MG/2ML IV SOLN
INTRAVENOUS | Status: DC | PRN
  Administered 2021-04-15: 200 mg via INTRAVENOUS

## 2021-04-15 MED ORDER — ROCURONIUM BROMIDE 10 MG/ML (PF) SYRINGE
PREFILLED_SYRINGE | INTRAVENOUS | Status: DC | PRN
Start: 2021-04-15 — End: 2021-04-15
  Administered 2021-04-15: 50 mg via INTRAVENOUS

## 2021-04-15 MED ORDER — ONDANSETRON HCL 4 MG PO TABS
4.0000 mg | ORAL_TABLET | Freq: Every day | ORAL | 1 refills | Status: AC | PRN
  Filled 2021-04-15 – 2021-04-16 (×2): qty 30, 30d supply, fill #0

## 2021-04-15 MED ORDER — LIDOCAINE 2% (20 MG/ML) 5 ML SYRINGE
INTRAMUSCULAR | Status: DC | PRN
  Administered 2021-04-15: 60 mg via INTRAVENOUS

## 2021-04-15 MED ORDER — EPINEPHRINE PF 1 MG/ML IJ SOLN
INTRAMUSCULAR | Status: DC | PRN
  Administered 2021-04-15: .15 mL via SUBCUTANEOUS

## 2021-04-15 MED ORDER — SODIUM CHLORIDE (PF) 0.9 % IJ SOLN
INTRAMUSCULAR | Status: AC
  Filled 2021-04-15: qty 30

## 2021-04-15 MED ORDER — FENTANYL CITRATE PF 50 MCG/ML IJ SOSY: 25.0000 ug | PREFILLED_SYRINGE | INTRAMUSCULAR | Status: DC | PRN

## 2021-04-15 MED ORDER — CHLORHEXIDINE GLUCONATE 0.12 % MT SOLN
15.0000 mL | Freq: Once | OROMUCOSAL | Status: AC
  Administered 2021-04-15: 12:00:00 15 mL via OROMUCOSAL

## 2021-04-15 MED ORDER — BUPIVACAINE LIPOSOME 1.3 % IJ SUSP
INTRAMUSCULAR | Status: DC | PRN
  Administered 2021-04-15: 10 mL via PERINEURAL

## 2021-04-15 MED ORDER — OXYCODONE HCL 5 MG PO TABS
ORAL_TABLET | ORAL | Status: AC
  Administered 2021-04-15: 18:00:00 5 mg via ORAL
  Filled 2021-04-15: qty 1

## 2021-04-15 MED ORDER — ROCURONIUM BROMIDE 10 MG/ML (PF) SYRINGE
PREFILLED_SYRINGE | INTRAVENOUS | Status: AC
  Filled 2021-04-15: qty 10

## 2021-04-15 MED ORDER — ONDANSETRON HCL 4 MG/2ML IJ SOLN
INTRAMUSCULAR | Status: DC | PRN
  Administered 2021-04-15: 4 mg via INTRAVENOUS

## 2021-04-15 MED ORDER — LIDOCAINE HCL (PF) 2 % IJ SOLN
INTRAMUSCULAR | Status: AC
  Filled 2021-04-15: qty 5

## 2021-04-15 MED ORDER — FENTANYL CITRATE PF 50 MCG/ML IJ SOSY: 50.0000 ug | PREFILLED_SYRINGE | INTRAMUSCULAR | Status: DC

## 2021-04-15 SURGICAL SUPPLY — 65 items
ANCH SUT 2 SWVLOCK 19.1 KNTLS (Anchor) ×1 IMPLANT
ANCH SUT TGRTAPE 1.3X2.6X1.7 (Anchor) ×1 IMPLANT
ANCHOR SUT FBRTK 2.6 SP1.7 TAP (Anchor) ×2 IMPLANT
ANCHOR SWIVELOCK 4.75X19.1 W2 (Anchor) ×2 IMPLANT
APL PRP STRL LF DISP 70% ISPRP (MISCELLANEOUS) ×2
BAG COUNTER SPONGE SURGICOUNT (BAG) IMPLANT
BAG SPEC THK2 15X12 ZIP CLS (MISCELLANEOUS) ×1
BAG SPNG CNTER NS LX DISP (BAG)
BAG SURGICOUNT SPONGE COUNTING (BAG)
BAG ZIPLOCK 12X15 (MISCELLANEOUS) ×3 IMPLANT
BLADE EXCALIBUR 5.0MM X 13CM (MISCELLANEOUS) ×1
BLADE EXCALIBUR 5.0X13 (MISCELLANEOUS) ×1 IMPLANT
BLADE SURG SZ11 CARB STEEL (BLADE) ×3 IMPLANT
BNDG COHESIVE 4X5 TAN ST LF (GAUZE/BANDAGES/DRESSINGS) ×3 IMPLANT
BOOTIES KNEE HIGH SLOAN (MISCELLANEOUS) ×6 IMPLANT
BURR OVAL 8 FLU 5.0MM X 13CM (MISCELLANEOUS) ×1
BURR OVAL 8 FLU 5.0X13 (MISCELLANEOUS) ×1 IMPLANT
CANNULA 5.75X7 CRYSTAL CLEAR (CANNULA) ×2 IMPLANT
CANNULA 5.75X71 LONG (CANNULA) ×3 IMPLANT
CANNULA TWIST IN 8.25X7CM (CANNULA) ×4 IMPLANT
CHLORAPREP W/TINT 26 (MISCELLANEOUS) ×5 IMPLANT
DRAPE ORTHO 2.5IN SPLIT 77X108 (DRAPES) IMPLANT
DRAPE ORTHO SPLIT 77X108 STRL (DRAPES)
DRAPE POUCH INSTRU U-SHP 10X18 (DRAPES) ×6 IMPLANT
DRAPE STERI 35X30 U-POUCH (DRAPES) ×3 IMPLANT
DRAPE SURG 17X11 SM STRL (DRAPES) ×3 IMPLANT
DRAPE U-SHAPE 47X51 STRL (DRAPES) ×3 IMPLANT
DRSG PAD ABDOMINAL 8X10 ST (GAUZE/BANDAGES/DRESSINGS) ×4 IMPLANT
DW OUTFLOW CASSETTE/TUBE SET (MISCELLANEOUS) ×3 IMPLANT
ELECT REM PT RETURN 15FT ADLT (MISCELLANEOUS) IMPLANT
EXCALIBUR 3.8MM X 13CM (MISCELLANEOUS) ×3 IMPLANT
FIBERSTICK 2 (SUTURE) IMPLANT
GAUZE SPONGE 4X4 12PLY STRL (GAUZE/BANDAGES/DRESSINGS) ×2 IMPLANT
GAUZE XEROFORM 1X8 LF (GAUZE/BANDAGES/DRESSINGS) ×2 IMPLANT
GLOVE SRG 8 PF TXTR STRL LF DI (GLOVE) ×1 IMPLANT
GLOVE SURG POLYISO LF SZ7 (GLOVE) ×3 IMPLANT
GLOVE SURG UNDER POLY LF SZ7.5 (GLOVE) ×3 IMPLANT
GLOVE SURG UNDER POLY LF SZ8 (GLOVE) ×3
GOWN STRL REUS W/TWL XL LVL3 (GOWN DISPOSABLE) ×6 IMPLANT
KIT ANCHOR FBRTK 2.6 STR (KITS) ×2 IMPLANT
KIT BASIN OR (CUSTOM PROCEDURE TRAY) ×3 IMPLANT
KIT TURNOVER KIT A (KITS) IMPLANT
MANIFOLD NEPTUNE II (INSTRUMENTS) ×3 IMPLANT
NDL SCORPION MULTI FIRE (NEEDLE) IMPLANT
NDL SPNL 18GX3.5 QUINCKE PK (NEEDLE) ×1 IMPLANT
NEEDLE HYPO 22GX1.5 SAFETY (NEEDLE) ×3 IMPLANT
NEEDLE SCORPION MULTI FIRE (NEEDLE) ×3 IMPLANT
NEEDLE SPNL 18GX3.5 QUINCKE PK (NEEDLE) ×3 IMPLANT
NS IRRIG 1000ML POUR BTL (IV SOLUTION) ×3 IMPLANT
PACK SHOULDER (CUSTOM PROCEDURE TRAY) ×3 IMPLANT
PORT APPOLLO RF 90DEGREE MULTI (SURGICAL WAND) ×5 IMPLANT
PROTECTOR NERVE ULNAR (MISCELLANEOUS) ×3 IMPLANT
SLEEVE ARM SUSPENSION SYSTEM (MISCELLANEOUS) ×3 IMPLANT
SLING ARM IMMOBILIZER LRG (SOFTGOODS) IMPLANT
SLING ARM IMMOBILIZER MED (SOFTGOODS) IMPLANT
SLING S3 LATERAL DISP (MISCELLANEOUS) ×3 IMPLANT
SLING ULTRA II L (ORTHOPEDIC SUPPLIES) ×2 IMPLANT
SUT ETHILON 3 0 PS 1 (SUTURE) ×3 IMPLANT
SUT PDS AB 0 CT 36 (SUTURE) IMPLANT
SYR 20ML LL LF (SYRINGE) ×3 IMPLANT
TOWEL OR 17X26 10 PK STRL BLUE (TOWEL DISPOSABLE) ×6 IMPLANT
TUBING ARTHROSCOPY IRRIG 16FT (MISCELLANEOUS) ×3 IMPLANT
TUBING CONNECTING 10 (TUBING) ×2 IMPLANT
TUBING CONNECTING 10' (TUBING) ×1
WATER STERILE IRR 1000ML POUR (IV SOLUTION) ×3 IMPLANT

## 2021-04-15 NOTE — Interval H&P Note (Signed)
History and Physical Interval Note:  04/15/2021 2:32 PM  Vicki Krueger  has presented today for surgery, with the diagnosis of Left shoulder rotator cuff tear, acromioclavicular osteoarthritis, loose bodies, biceps tendinosis.  The various methods of treatment have been discussed with the patient and family. After consideration of risks, benefits and other options for treatment, the patient has consented to  Procedure(s) with comments: SHOULDER ARTHROSCOPY WITH SUBACROMIAL DECOMPRESSION, DISTAL CLAVICLE RESECTION, ROTATOR CUFF REPAIR,BICEPS TENOTOMY, REMOVAL LOOSE BODIES (Left) - with interscalene block as a surgical intervention.  The patient's history has been reviewed, patient examined, no change in status, stable for surgery.  I have reviewed the patient's chart and labs.  Questions were answered to the patient's satisfaction.     Chrles Selley ANDREW

## 2021-04-15 NOTE — Anesthesia Preprocedure Evaluation (Addendum)
Anesthesia Evaluation  Patient identified by MRN, date of birth, ID band Patient awake    Reviewed: Allergy & Precautions, NPO status , Patient's Chart, lab work & pertinent test results, reviewed documented beta blocker date and time   History of Anesthesia Complications Negative for: history of anesthetic complications  Airway Mallampati: III  TM Distance: >3 FB Neck ROM: Full    Dental  (+) Dental Advisory Given, Partial Upper, Chipped, Implants, Caps   Pulmonary neg pulmonary ROS,    Pulmonary exam normal        Cardiovascular hypertension, Pt. on home beta blockers and Pt. on medications Normal cardiovascular exam     Neuro/Psych negative neurological ROS  negative psych ROS   GI/Hepatic hiatal hernia, GERD  Medicated and Controlled,(+) Hepatitis -, C  Endo/Other  Morbid obesity  Renal/GU negative Renal ROS     Musculoskeletal  (+) Arthritis ,   Abdominal   Peds  Hematology negative hematology ROS (+)   Anesthesia Other Findings   Reproductive/Obstetrics                            Anesthesia Physical Anesthesia Plan  ASA: 3  Anesthesia Plan: General   Post-op Pain Management: Regional block and Tylenol PO (pre-op)   Induction: Intravenous  PONV Risk Score and Plan: 3 and Treatment may vary due to age or medical condition, Ondansetron and Dexamethasone  Airway Management Planned: Oral ETT  Additional Equipment: None  Intra-op Plan:   Post-operative Plan: Extubation in OR  Informed Consent: I have reviewed the patients History and Physical, chart, labs and discussed the procedure including the risks, benefits and alternatives for the proposed anesthesia with the patient or authorized representative who has indicated his/her understanding and acceptance.     Dental advisory given  Plan Discussed with: CRNA and Anesthesiologist  Anesthesia Plan Comments:         Anesthesia Quick Evaluation

## 2021-04-15 NOTE — Anesthesia Postprocedure Evaluation (Signed)
Anesthesia Post Note  Patient: Vicki Krueger  Procedure(s) Performed: SHOULDER ARTHROSCOPY WITH SUBACROMIAL DECOMPRESSION, DISTAL CLAVICLE RESECTION, ROTATOR CUFF REPAIR,BICEPS TENOTOMY, REMOVAL LOOSE BODIES (Left: Shoulder)     Patient location during evaluation: PACU Anesthesia Type: General Level of consciousness: awake and alert Pain management: pain level controlled Vital Signs Assessment: post-procedure vital signs reviewed and stable Respiratory status: spontaneous breathing, nonlabored ventilation and respiratory function stable Cardiovascular status: stable and blood pressure returned to baseline Anesthetic complications: no   No notable events documented.  Last Vitals:  Vitals:   04/15/21 1715 04/15/21 1732  BP: 137/90 (!) 137/92  Pulse: 69 64  Resp: 12 20  Temp:  36.6 C  SpO2: 91% 95%    Last Pain:  Vitals:   04/15/21 1732  TempSrc:   PainSc: 0-No pain                 Beryle Lathe

## 2021-04-15 NOTE — Progress Notes (Signed)
AssistedDr. Brock with left, ultrasound guided, interscalene  block. Side rails up, monitors on throughout procedure. See vital signs in flow sheet. Tolerated Procedure well.  

## 2021-04-15 NOTE — Anesthesia Procedure Notes (Signed)
Procedure Name: Intubation Date/Time: 04/15/2021 2:48 PM Performed by: Pearson Grippe, CRNA Pre-anesthesia Checklist: Patient identified, Emergency Drugs available, Suction available and Patient being monitored Patient Re-evaluated:Patient Re-evaluated prior to induction Oxygen Delivery Method: Circle system utilized Preoxygenation: Pre-oxygenation with 100% oxygen Induction Type: IV induction Ventilation: Mask ventilation without difficulty Laryngoscope Size: Miller and 2 Grade View: Grade I Tube type: Oral Tube size: 7.0 mm Number of attempts: 1 Airway Equipment and Method: Stylet and Oral airway Placement Confirmation: ETT inserted through vocal cords under direct vision, positive ETCO2 and breath sounds checked- equal and bilateral Secured at: 20 cm Tube secured with: Tape Dental Injury: Teeth and Oropharynx as per pre-operative assessment

## 2021-04-15 NOTE — Anesthesia Procedure Notes (Signed)
Anesthesia Regional Block: Interscalene brachial plexus block   Pre-Anesthetic Checklist: , timeout performed,  Correct Patient, Correct Site, Correct Laterality,  Correct Procedure, Correct Position, site marked,  Risks and benefits discussed,  Surgical consent,  Pre-op evaluation,  At surgeon's request and post-op pain management  Laterality: Left  Prep: chloraprep       Needles:  Injection technique: Single-shot  Needle Type: Echogenic Needle     Needle Length: 5cm  Needle Gauge: 21     Additional Needles:   Narrative:  Start time: 04/15/2021 1:40 PM End time: 04/15/2021 1:45 PM Injection made incrementally with aspirations every 5 mL.  Performed by: Personally  Anesthesiologist: Beryle Lathe, MD  Additional Notes: No pain on injection. No increased resistance to injection. Injection made in 5cc increments. Good needle visualization. Patient tolerated the procedure well.

## 2021-04-15 NOTE — Op Note (Signed)
Preop diagnosis #1 left shoulder rotator cuff tear supra s portion infraspinatus #2 partial biceps tendon tear #3 Superior labral tearing #4 possible loose body #5 impingement syndrome #6 AC joint arthritis Postop diagnosis #1 same 2 same 3 same 4 impingement syndrome 5 AC joint arthritis Procedure #1 left shoulder arthroscopic rotator cuff repair.  #2 arthroscopic biceps tenotomy #3 arthroscopic labral debridement #4 arthroscopic subacromial decompression with acromioplasty bursectomy CA ligament release #5 arthroscopic distal clavicle resection Mumford procedure Surgeon Hart Robinsons, MD Assistant Elenor Legato, PA-C Anesthesia interscalene block general For blood loss minimal Drains none Complications none Disposition PACU stable  Operative details Patient was encountered in the holding x-rays identified marked signed appropriate chart reviewed.  Interscalene block was administered per anesthesiologist.  IV antibiotics were given within 1 hour of the surgical incision time.  Taken the operating room placed in the supine position under general anesthesia.  PAS stockings were applied for DVT prophylaxis and advised to a right lateral decubitus position properly padded and bumped.  Left lower extremity gently examined functional range of motion prepped with ChloraPrep draped into a sterile fashion.  Arthrex sterile shoulder holder utilized 30 degrees abduction 30 with a forward flexion 15 pounds longitudinal traction to make sure we did not over distracted.  After timeout posterior portal was created arthroscope was placed through the glenohumeral joint medial fibers mild chondromalacia type I labral tearing superiorly anteriorly high-grade partial biceps tendon tear and full-thickness rotator cuff tear supraspinatus leading into the infraspinatus.  Lateral portal was established neurovascular structures were protected axillary nerve shaver was introduced the labrum was debrided back to healthy tissue  smoothed down with cautery.  The labrum after this was then inspected no further tears.  The biceps underwent a biceps tenotomy utilizing the cautery smoothed down nicely.  Loose bodies were reported by the MRI scan but number arthroscopically encountered all the 4.  The rotator cuff debrided back to healthy tissue it was a large rotator cuff tear involving supraspinatus and a portion of the infraspinatus.  Cuff was mobilized.  Soft tissue was removed.  Cautery was utilized take down his grades tuberosity soft tissue gently smoothed down with a bur.  The cautery was utilized to take down the periosteum CA ligament burs and placed posteriorly anterior inferior lateral acromioplasty was performed converting to a flatter acromial morphology.  AC joint family markedly osteoarthritic Subclavicular spur excess anterior portal made birds introduced lateral 5 to 8 mm of the clavicle was removed circumferentially to make sure the superior capsule left intact.  Clavicle was palpated family stable.  Small puncture was made superiorly and Arthrex suture anchors placed for the medial row 4 limbs were sutured in place to the rotator cuff tear out of tapes.  Arms and abducted internal swivel lock for suture bridge double row technique.  I will also note that the bony greater tuberosity was relatively soft.  The safety suture and the anchor was then utilized for the past posterior aspect of the calf given excellent repair.  This time we were pleased with the repair not concerned with the quality of rebound this point time therefore should be held still afterwards for 6 weeks Allow this to heal.  Hemostasis was obtained no other abnormalities noted irrigated out scalp clip was removed.  Portal closed 1 suture.  Sterile dress applied patient shoulder abduction sling turned supine awakened taken operating PACU stable condition patient was stabilized PACU discharged home she will be started we will hold off on physical therapy  for the first 6 weeks.  Also no biceps loading for 8 weeks.  To help with patient positioning prepping draping technical surgical assistance throughout the entire case anatomic localization assisted with devices and protection of neurovascular structures Ms. Carylon Perches, PA-C assistance was needed that is entire case.

## 2021-04-15 NOTE — Interval H&P Note (Signed)
History and Physical Interval Note:  04/15/2021 2:33 PM  Vicki Krueger  has presented today for surgery, with the diagnosis of Left shoulder rotator cuff tear, acromioclavicular osteoarthritis, loose bodies, biceps tendinosis.  The various methods of treatment have been discussed with the patient and family. After consideration of risks, benefits and other options for treatment, the patient has consented to  Procedure(s) with comments: SHOULDER ARTHROSCOPY WITH SUBACROMIAL DECOMPRESSION, DISTAL CLAVICLE RESECTION, ROTATOR CUFF REPAIR,BICEPS TENOTOMY, REMOVAL LOOSE BODIES (Left) - with interscalene block as a surgical intervention.  The patient's history has been reviewed, patient examined, no change in status, stable for surgery.  I have reviewed the patient's chart and labs.  Questions were answered to the patient's satisfaction.     Huel Centola ANDREW

## 2021-04-15 NOTE — Transfer of Care (Signed)
Immediate Anesthesia Transfer of Care Note  Patient: Vicki Krueger  Procedure(s) Performed: SHOULDER ARTHROSCOPY WITH SUBACROMIAL DECOMPRESSION, DISTAL CLAVICLE RESECTION, ROTATOR CUFF REPAIR,BICEPS TENOTOMY, REMOVAL LOOSE BODIES (Left)  Patient Location: PACU  Anesthesia Type:GA combined with regional for post-op pain  Level of Consciousness: awake, alert  and oriented  Airway & Oxygen Therapy: Patient Spontanous Breathing and Patient connected to face mask oxygen  Post-op Assessment: Report given to RN and Post -op Vital signs reviewed and stable  Post vital signs: Reviewed and stable  Last Vitals:  Vitals Value Taken Time  BP 132/76 04/15/21 1630  Temp    Pulse 72 04/15/21 1631  Resp 13 04/15/21 1631  SpO2 99 % 04/15/21 1631  Vitals shown include unvalidated device data.  Last Pain:  Vitals:   04/15/21 1400  TempSrc:   PainSc: 0-No pain      Patients Stated Pain Goal: 4 (04/15/21 1338)  Complications: No notable events documented.

## 2021-04-15 NOTE — Interval H&P Note (Signed)
History and Physical Interval Note:  04/15/2021 2:33 PM  Vicki Krueger  has presented today for surgery, with the diagnosis of Left shoulder rotator cuff tear, acromioclavicular osteoarthritis, loose bodies, biceps tendinosis.  The various methods of treatment have been discussed with the patient and family. After consideration of risks, benefits and other options for treatment, the patient has consented to  Procedure(s) with comments: SHOULDER ARTHROSCOPY WITH SUBACROMIAL DECOMPRESSION, DISTAL CLAVICLE RESECTION, ROTATOR CUFF REPAIR,BICEPS TENOTOMY, REMOVAL LOOSE BODIES (Left) - with interscalene block as a surgical intervention.  The patient's history has been reviewed, patient examined, no change in status, stable for surgery.  I have reviewed the patient's chart and labs.  Questions were answered to the patient's satisfaction.     [name] ANDREW

## 2021-04-16 ENCOUNTER — Other Ambulatory Visit (HOSPITAL_COMMUNITY): Payer: Self-pay

## 2021-04-17 ENCOUNTER — Encounter (HOSPITAL_COMMUNITY): Payer: Self-pay | Admitting: Specialist

## 2021-04-18 ENCOUNTER — Other Ambulatory Visit (HOSPITAL_COMMUNITY): Payer: Self-pay

## 2021-04-18 MED ORDER — SPIRONOLACTONE 25 MG PO TABS
50.0000 mg | ORAL_TABLET | Freq: Every day | ORAL | 3 refills | Status: DC
  Filled 2021-04-18: qty 180, 90d supply, fill #0
  Filled 2021-07-18: qty 180, 90d supply, fill #1
  Filled 2021-10-20: qty 180, 90d supply, fill #2
  Filled 2022-01-19: qty 180, 90d supply, fill #3

## 2021-04-22 DIAGNOSIS — M25512 Pain in left shoulder: Secondary | ICD-10-CM | POA: Diagnosis not present

## 2021-04-28 DIAGNOSIS — Z4789 Encounter for other orthopedic aftercare: Secondary | ICD-10-CM | POA: Diagnosis not present

## 2021-04-29 DIAGNOSIS — M25512 Pain in left shoulder: Secondary | ICD-10-CM | POA: Diagnosis not present

## 2021-05-02 ENCOUNTER — Other Ambulatory Visit (HOSPITAL_COMMUNITY): Payer: Self-pay

## 2021-05-02 DIAGNOSIS — M25512 Pain in left shoulder: Secondary | ICD-10-CM | POA: Diagnosis not present

## 2021-05-06 DIAGNOSIS — M25512 Pain in left shoulder: Secondary | ICD-10-CM | POA: Diagnosis not present

## 2021-05-13 DIAGNOSIS — M25512 Pain in left shoulder: Secondary | ICD-10-CM | POA: Diagnosis not present

## 2021-05-16 DIAGNOSIS — M25512 Pain in left shoulder: Secondary | ICD-10-CM | POA: Diagnosis not present

## 2021-05-20 DIAGNOSIS — M25512 Pain in left shoulder: Secondary | ICD-10-CM | POA: Diagnosis not present

## 2021-05-22 DIAGNOSIS — M25512 Pain in left shoulder: Secondary | ICD-10-CM | POA: Diagnosis not present

## 2021-05-27 DIAGNOSIS — M25512 Pain in left shoulder: Secondary | ICD-10-CM | POA: Diagnosis not present

## 2021-05-29 DIAGNOSIS — M25512 Pain in left shoulder: Secondary | ICD-10-CM | POA: Diagnosis not present

## 2021-06-03 DIAGNOSIS — M25512 Pain in left shoulder: Secondary | ICD-10-CM | POA: Diagnosis not present

## 2021-06-06 DIAGNOSIS — M25512 Pain in left shoulder: Secondary | ICD-10-CM | POA: Diagnosis not present

## 2021-06-10 DIAGNOSIS — M25512 Pain in left shoulder: Secondary | ICD-10-CM | POA: Diagnosis not present

## 2021-06-12 DIAGNOSIS — M25512 Pain in left shoulder: Secondary | ICD-10-CM | POA: Diagnosis not present

## 2021-06-17 DIAGNOSIS — M25512 Pain in left shoulder: Secondary | ICD-10-CM | POA: Diagnosis not present

## 2021-06-20 DIAGNOSIS — M25512 Pain in left shoulder: Secondary | ICD-10-CM | POA: Diagnosis not present

## 2021-06-23 ENCOUNTER — Other Ambulatory Visit (HOSPITAL_COMMUNITY): Payer: Self-pay

## 2021-06-23 MED ORDER — TRAMADOL HCL 50 MG PO TABS
50.0000 mg | ORAL_TABLET | ORAL | 2 refills | Status: DC
  Filled 2021-06-23: qty 90, 30d supply, fill #0
  Filled 2021-09-19: qty 90, 30d supply, fill #1
  Filled 2021-11-19: qty 90, 30d supply, fill #2

## 2021-06-24 DIAGNOSIS — M25512 Pain in left shoulder: Secondary | ICD-10-CM | POA: Diagnosis not present

## 2021-06-26 DIAGNOSIS — M25512 Pain in left shoulder: Secondary | ICD-10-CM | POA: Diagnosis not present

## 2021-07-01 DIAGNOSIS — M25512 Pain in left shoulder: Secondary | ICD-10-CM | POA: Diagnosis not present

## 2021-07-03 ENCOUNTER — Other Ambulatory Visit (HOSPITAL_COMMUNITY): Payer: Self-pay

## 2021-07-11 DIAGNOSIS — M25512 Pain in left shoulder: Secondary | ICD-10-CM | POA: Diagnosis not present

## 2021-07-15 DIAGNOSIS — M25512 Pain in left shoulder: Secondary | ICD-10-CM | POA: Diagnosis not present

## 2021-07-17 ENCOUNTER — Other Ambulatory Visit (HOSPITAL_COMMUNITY): Payer: Self-pay

## 2021-07-17 DIAGNOSIS — G43919 Migraine, unspecified, intractable, without status migrainosus: Secondary | ICD-10-CM | POA: Diagnosis not present

## 2021-07-17 DIAGNOSIS — E782 Mixed hyperlipidemia: Secondary | ICD-10-CM | POA: Diagnosis not present

## 2021-07-17 DIAGNOSIS — I10 Essential (primary) hypertension: Secondary | ICD-10-CM | POA: Diagnosis not present

## 2021-07-17 DIAGNOSIS — Z79899 Other long term (current) drug therapy: Secondary | ICD-10-CM | POA: Diagnosis not present

## 2021-07-17 DIAGNOSIS — K219 Gastro-esophageal reflux disease without esophagitis: Secondary | ICD-10-CM | POA: Diagnosis not present

## 2021-07-17 DIAGNOSIS — Z0001 Encounter for general adult medical examination with abnormal findings: Secondary | ICD-10-CM | POA: Diagnosis not present

## 2021-07-17 DIAGNOSIS — E559 Vitamin D deficiency, unspecified: Secondary | ICD-10-CM | POA: Diagnosis not present

## 2021-07-17 DIAGNOSIS — M75102 Unspecified rotator cuff tear or rupture of left shoulder, not specified as traumatic: Secondary | ICD-10-CM | POA: Diagnosis not present

## 2021-07-17 MED ORDER — PHENTERMINE HCL 15 MG PO CAPS
15.0000 mg | ORAL_CAPSULE | Freq: Every day | ORAL | 5 refills | Status: DC
  Filled 2021-07-17: qty 30, 30d supply, fill #0

## 2021-07-18 ENCOUNTER — Other Ambulatory Visit (HOSPITAL_COMMUNITY): Payer: Self-pay

## 2021-08-07 ENCOUNTER — Other Ambulatory Visit: Payer: Self-pay

## 2021-09-05 DIAGNOSIS — M25512 Pain in left shoulder: Secondary | ICD-10-CM | POA: Diagnosis not present

## 2021-09-12 DIAGNOSIS — M25512 Pain in left shoulder: Secondary | ICD-10-CM | POA: Diagnosis not present

## 2021-09-17 DIAGNOSIS — Z4789 Encounter for other orthopedic aftercare: Secondary | ICD-10-CM | POA: Diagnosis not present

## 2021-09-19 ENCOUNTER — Other Ambulatory Visit (HOSPITAL_COMMUNITY): Payer: Self-pay

## 2021-09-19 DIAGNOSIS — M25512 Pain in left shoulder: Secondary | ICD-10-CM | POA: Diagnosis not present

## 2021-10-01 ENCOUNTER — Other Ambulatory Visit (HOSPITAL_COMMUNITY): Payer: Self-pay

## 2021-10-20 ENCOUNTER — Other Ambulatory Visit (HOSPITAL_COMMUNITY): Payer: Self-pay

## 2021-11-19 ENCOUNTER — Other Ambulatory Visit (HOSPITAL_COMMUNITY): Payer: Self-pay

## 2021-12-10 ENCOUNTER — Other Ambulatory Visit (HOSPITAL_COMMUNITY): Payer: Self-pay

## 2021-12-10 MED ORDER — ATORVASTATIN CALCIUM 10 MG PO TABS
10.0000 mg | ORAL_TABLET | Freq: Every day | ORAL | 3 refills | Status: DC
  Filled 2021-12-10: qty 90, 90d supply, fill #0
  Filled 2022-03-17: qty 90, 90d supply, fill #1
  Filled 2022-06-18: qty 90, 90d supply, fill #2
  Filled 2022-09-15: qty 90, 90d supply, fill #3

## 2021-12-10 MED ORDER — METOPROLOL SUCCINATE ER 25 MG PO TB24
25.0000 mg | ORAL_TABLET | Freq: Every day | ORAL | 3 refills | Status: DC
  Filled 2021-12-10: qty 90, 90d supply, fill #0
  Filled 2022-03-17: qty 90, 90d supply, fill #1
  Filled 2022-06-18: qty 90, 90d supply, fill #2
  Filled 2022-09-15: qty 90, 90d supply, fill #3

## 2021-12-30 ENCOUNTER — Other Ambulatory Visit (HOSPITAL_COMMUNITY): Payer: Self-pay

## 2022-01-16 ENCOUNTER — Other Ambulatory Visit (HOSPITAL_COMMUNITY): Payer: Self-pay

## 2022-01-16 MED ORDER — TRAMADOL HCL 50 MG PO TABS
50.0000 mg | ORAL_TABLET | Freq: Three times a day (TID) | ORAL | 2 refills | Status: DC | PRN
  Filled 2022-01-16: qty 90, 30d supply, fill #0
  Filled 2022-03-20: qty 90, 30d supply, fill #1
  Filled 2022-06-02: qty 90, 30d supply, fill #2

## 2022-01-19 ENCOUNTER — Other Ambulatory Visit (HOSPITAL_COMMUNITY): Payer: Self-pay

## 2022-03-17 ENCOUNTER — Other Ambulatory Visit (HOSPITAL_COMMUNITY): Payer: Self-pay

## 2022-03-20 ENCOUNTER — Other Ambulatory Visit (HOSPITAL_COMMUNITY): Payer: Self-pay

## 2022-03-20 MED ORDER — OMEPRAZOLE 20 MG PO CPDR
20.0000 mg | DELAYED_RELEASE_CAPSULE | ORAL | 4 refills | Status: DC
  Filled 2022-03-20: qty 90, 45d supply, fill #0
  Filled 2022-06-18: qty 90, 45d supply, fill #1
  Filled 2022-09-08: qty 90, 45d supply, fill #2
  Filled 2022-12-02: qty 90, 45d supply, fill #3

## 2022-03-26 ENCOUNTER — Other Ambulatory Visit (HOSPITAL_COMMUNITY): Payer: Self-pay

## 2022-04-15 ENCOUNTER — Other Ambulatory Visit (HOSPITAL_COMMUNITY): Payer: Self-pay

## 2022-04-15 MED ORDER — SPIRONOLACTONE 25 MG PO TABS
50.0000 mg | ORAL_TABLET | Freq: Every day | ORAL | 1 refills | Status: DC
  Filled 2022-04-15: qty 180, 90d supply, fill #0
  Filled 2022-07-16: qty 180, 90d supply, fill #1

## 2022-06-02 ENCOUNTER — Other Ambulatory Visit: Payer: Self-pay

## 2022-06-18 ENCOUNTER — Other Ambulatory Visit (HOSPITAL_COMMUNITY): Payer: Self-pay

## 2022-06-25 ENCOUNTER — Other Ambulatory Visit (HOSPITAL_COMMUNITY): Payer: Self-pay

## 2022-06-25 ENCOUNTER — Other Ambulatory Visit: Payer: Self-pay

## 2022-06-25 MED ORDER — TOPIRAMATE 50 MG PO TABS
50.0000 mg | ORAL_TABLET | Freq: Two times a day (BID) | ORAL | 0 refills | Status: DC
  Filled 2022-06-25: qty 180, 90d supply, fill #0

## 2022-07-16 ENCOUNTER — Other Ambulatory Visit (HOSPITAL_COMMUNITY): Payer: Self-pay

## 2022-07-20 DIAGNOSIS — E782 Mixed hyperlipidemia: Secondary | ICD-10-CM | POA: Diagnosis not present

## 2022-07-20 DIAGNOSIS — Z780 Asymptomatic menopausal state: Secondary | ICD-10-CM | POA: Diagnosis not present

## 2022-07-20 DIAGNOSIS — Z79899 Other long term (current) drug therapy: Secondary | ICD-10-CM | POA: Diagnosis not present

## 2022-07-20 DIAGNOSIS — E559 Vitamin D deficiency, unspecified: Secondary | ICD-10-CM | POA: Diagnosis not present

## 2022-07-20 DIAGNOSIS — M25571 Pain in right ankle and joints of right foot: Secondary | ICD-10-CM | POA: Diagnosis not present

## 2022-07-20 DIAGNOSIS — K219 Gastro-esophageal reflux disease without esophagitis: Secondary | ICD-10-CM | POA: Diagnosis not present

## 2022-07-20 DIAGNOSIS — Z0001 Encounter for general adult medical examination with abnormal findings: Secondary | ICD-10-CM | POA: Diagnosis not present

## 2022-07-20 DIAGNOSIS — G43919 Migraine, unspecified, intractable, without status migrainosus: Secondary | ICD-10-CM | POA: Diagnosis not present

## 2022-07-20 DIAGNOSIS — I10 Essential (primary) hypertension: Secondary | ICD-10-CM | POA: Diagnosis not present

## 2022-07-20 DIAGNOSIS — M542 Cervicalgia: Secondary | ICD-10-CM | POA: Diagnosis not present

## 2022-08-07 ENCOUNTER — Other Ambulatory Visit: Payer: Self-pay | Admitting: Internal Medicine

## 2022-08-07 DIAGNOSIS — Z780 Asymptomatic menopausal state: Secondary | ICD-10-CM

## 2022-08-25 ENCOUNTER — Ambulatory Visit: Payer: Commercial Managed Care - PPO | Attending: Internal Medicine

## 2022-08-25 ENCOUNTER — Other Ambulatory Visit: Payer: Self-pay

## 2022-08-25 DIAGNOSIS — M542 Cervicalgia: Secondary | ICD-10-CM | POA: Insufficient documentation

## 2022-08-25 DIAGNOSIS — R252 Cramp and spasm: Secondary | ICD-10-CM | POA: Diagnosis not present
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
## 2022-09-03 ENCOUNTER — Ambulatory Visit: Payer: Commercial Managed Care - PPO

## 2022-09-03 DIAGNOSIS — M542 Cervicalgia: Secondary | ICD-10-CM

## 2022-09-03 DIAGNOSIS — R252 Cramp and spasm: Secondary | ICD-10-CM
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
## 2022-09-07 ENCOUNTER — Other Ambulatory Visit: Payer: Self-pay | Admitting: Internal Medicine

## 2022-09-07 DIAGNOSIS — Z1231 Encounter for screening mammogram for malignant neoplasm of breast: Secondary | ICD-10-CM

## 2022-09-08 ENCOUNTER — Ambulatory Visit
Admission: RE | Admit: 2022-09-08 | Discharge: 2022-09-08 | Disposition: A | Discharge status: Home / Self Care | Payer: Commercial Managed Care - PPO | Source: Ambulatory Visit | Attending: Internal Medicine | Admitting: Internal Medicine

## 2022-09-08 ENCOUNTER — Other Ambulatory Visit (HOSPITAL_COMMUNITY): Payer: Self-pay

## 2022-09-08 ENCOUNTER — Ambulatory Visit: Payer: Commercial Managed Care - PPO

## 2022-09-08 DIAGNOSIS — R252 Cramp and spasm: Secondary | ICD-10-CM | POA: Diagnosis not present

## 2022-09-08 DIAGNOSIS — M542 Cervicalgia: Secondary | ICD-10-CM

## 2022-09-08 DIAGNOSIS — Z1231 Encounter for screening mammogram for malignant neoplasm of breast: Secondary | ICD-10-CM | POA: Diagnosis not present
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
## 2022-09-10 ENCOUNTER — Other Ambulatory Visit: Payer: Self-pay | Admitting: Internal Medicine

## 2022-09-10 DIAGNOSIS — R928 Other abnormal and inconclusive findings on diagnostic imaging of breast: Secondary | ICD-10-CM

## 2022-09-15 ENCOUNTER — Other Ambulatory Visit (HOSPITAL_COMMUNITY): Payer: Self-pay

## 2022-09-17 ENCOUNTER — Ambulatory Visit: Payer: Commercial Managed Care - PPO

## 2022-09-17 DIAGNOSIS — R252 Cramp and spasm: Secondary | ICD-10-CM | POA: Diagnosis not present

## 2022-09-17 DIAGNOSIS — M542 Cervicalgia: Secondary | ICD-10-CM | POA: Diagnosis not present
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
## 2022-09-18 ENCOUNTER — Ambulatory Visit: Payer: Commercial Managed Care - PPO

## 2022-09-18 DIAGNOSIS — M542 Cervicalgia: Secondary | ICD-10-CM | POA: Diagnosis not present

## 2022-09-18 DIAGNOSIS — R252 Cramp and spasm: Secondary | ICD-10-CM

## 2022-09-21 ENCOUNTER — Ambulatory Visit: Payer: Commercial Managed Care - PPO | Attending: Internal Medicine | Admitting: Physical Therapy

## 2022-09-21 ENCOUNTER — Other Ambulatory Visit (HOSPITAL_COMMUNITY): Payer: Self-pay

## 2022-09-21 DIAGNOSIS — R252 Cramp and spasm: Secondary | ICD-10-CM | POA: Diagnosis not present

## 2022-09-21 DIAGNOSIS — M542 Cervicalgia: Secondary | ICD-10-CM | POA: Insufficient documentation

## 2022-09-21 MED ORDER — TOPIRAMATE 50 MG PO TABS
50.0000 mg | ORAL_TABLET | Freq: Two times a day (BID) | ORAL | 3 refills | Status: DC
  Filled 2022-09-21: qty 180, 90d supply, fill #0
  Filled 2022-12-24: qty 180, 90d supply, fill #1
  Filled 2023-03-22: qty 180, 90d supply, fill #2
  Filled 2023-06-24: qty 180, 90d supply, fill #3
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
## 2022-09-22 ENCOUNTER — Ambulatory Visit: Payer: Commercial Managed Care - PPO

## 2022-09-22 DIAGNOSIS — M542 Cervicalgia: Secondary | ICD-10-CM

## 2022-09-22 DIAGNOSIS — R252 Cramp and spasm: Secondary | ICD-10-CM
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
## 2022-10-01 ENCOUNTER — Other Ambulatory Visit: Payer: Self-pay | Admitting: Internal Medicine

## 2022-10-01 ENCOUNTER — Ambulatory Visit: Payer: Commercial Managed Care - PPO

## 2022-10-01 ENCOUNTER — Ambulatory Visit
Admission: RE | Admit: 2022-10-01 | Discharge: 2022-10-01 | Disposition: A | Discharge status: Home / Self Care | Payer: Commercial Managed Care - PPO | Source: Ambulatory Visit | Attending: Internal Medicine | Admitting: Internal Medicine

## 2022-10-01 DIAGNOSIS — R252 Cramp and spasm: Secondary | ICD-10-CM | POA: Diagnosis not present

## 2022-10-01 DIAGNOSIS — M542 Cervicalgia: Secondary | ICD-10-CM | POA: Diagnosis not present

## 2022-10-01 DIAGNOSIS — R928 Other abnormal and inconclusive findings on diagnostic imaging of breast: Secondary | ICD-10-CM

## 2022-10-01 DIAGNOSIS — R921 Mammographic calcification found on diagnostic imaging of breast: Secondary | ICD-10-CM | POA: Diagnosis not present

## 2022-10-02 ENCOUNTER — Ambulatory Visit: Payer: Commercial Managed Care - PPO

## 2022-10-02 DIAGNOSIS — R252 Cramp and spasm: Secondary | ICD-10-CM | POA: Diagnosis not present

## 2022-10-02 DIAGNOSIS — M542 Cervicalgia: Secondary | ICD-10-CM
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
## 2022-10-03 DIAGNOSIS — H524 Presbyopia: Secondary | ICD-10-CM | POA: Diagnosis not present
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
## 2022-10-05 ENCOUNTER — Ambulatory Visit: Payer: Commercial Managed Care - PPO | Admitting: Physical Therapy

## 2022-10-05 DIAGNOSIS — M542 Cervicalgia: Secondary | ICD-10-CM

## 2022-10-05 DIAGNOSIS — R252 Cramp and spasm: Secondary | ICD-10-CM | POA: Diagnosis not present

## 2022-10-06 ENCOUNTER — Ambulatory Visit: Payer: Commercial Managed Care - PPO | Admitting: Physical Therapy

## 2022-10-06 ENCOUNTER — Encounter: Payer: Self-pay | Admitting: Physical Therapy

## 2022-10-06 DIAGNOSIS — R252 Cramp and spasm: Secondary | ICD-10-CM

## 2022-10-06 DIAGNOSIS — M542 Cervicalgia: Secondary | ICD-10-CM | POA: Diagnosis not present
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
## 2022-10-09 ENCOUNTER — Ambulatory Visit
Admission: RE | Admit: 2022-10-09 | Discharge: 2022-10-09 | Disposition: A | Discharge status: Home / Self Care | Payer: Commercial Managed Care - PPO | Source: Ambulatory Visit | Attending: Internal Medicine | Admitting: Internal Medicine

## 2022-10-09 ENCOUNTER — Other Ambulatory Visit: Payer: Self-pay | Admitting: Internal Medicine

## 2022-10-09 DIAGNOSIS — R928 Other abnormal and inconclusive findings on diagnostic imaging of breast: Secondary | ICD-10-CM

## 2022-10-09 DIAGNOSIS — R921 Mammographic calcification found on diagnostic imaging of breast: Secondary | ICD-10-CM | POA: Diagnosis not present

## 2022-10-09 HISTORY — PX: BREAST BIOPSY: SHX20

## 2022-10-14 ENCOUNTER — Telehealth: Payer: Self-pay | Admitting: Hematology and Oncology

## 2022-10-14 ENCOUNTER — Other Ambulatory Visit (HOSPITAL_COMMUNITY): Payer: Self-pay

## 2022-10-14 MED ORDER — SPIRONOLACTONE 25 MG PO TABS
50.0000 mg | ORAL_TABLET | Freq: Every day | ORAL | 3 refills | Status: DC
  Filled 2022-10-14: qty 180, 90d supply, fill #0
  Filled 2023-01-11: qty 180, 90d supply, fill #1
  Filled 2023-04-09: qty 180, 90d supply, fill #2
  Filled 2023-07-13: qty 180, 90d supply, fill #3

## 2022-10-14 NOTE — Telephone Encounter (Signed)
Spoke to patient to confirm upcoming afternoon [facility] clinic appointment  on 7/3, paperwork will be sent via pick up.   Gave location and time, also informed patient that the surgeon's office would be calling as well to get information from them similar to the packet that they will be receiving so make sure to do both.  Reminded patient that all providers will be coming to the clinic to see them HERE and if they had any questions to not hesitate to reach back out to myself or their navigators.

## 2022-10-15 ENCOUNTER — Ambulatory Visit: Payer: Commercial Managed Care - PPO | Admitting: Physical Therapy

## 2022-10-15 ENCOUNTER — Other Ambulatory Visit (HOSPITAL_COMMUNITY): Payer: Self-pay

## 2022-10-15 ENCOUNTER — Encounter: Payer: Self-pay | Admitting: Physical Therapy

## 2022-10-15 DIAGNOSIS — M542 Cervicalgia: Secondary | ICD-10-CM

## 2022-10-15 DIAGNOSIS — R252 Cramp and spasm: Secondary | ICD-10-CM
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
## 2022-10-16 ENCOUNTER — Other Ambulatory Visit (HOSPITAL_COMMUNITY): Payer: Self-pay

## 2022-10-16 ENCOUNTER — Ambulatory Visit: Payer: Commercial Managed Care - PPO

## 2022-10-19 ENCOUNTER — Encounter: Payer: Self-pay | Admitting: *Deleted

## 2022-10-19 DIAGNOSIS — D0512 Intraductal carcinoma in situ of left breast: Secondary | ICD-10-CM | POA: Insufficient documentation

## 2022-10-20 NOTE — Progress Notes (Signed)
Radiation Oncology         [phone] ________________________________  Initial Outpatient Consultation  Name: Vicki Krueger MRN: [id number]  Date: 10/21/2022  DOB: 1950/10/19  WU:[id number], Molly Maduro, MD (Inactive)  Abigail Miyamoto, MD   REFERRING PHYSICIAN: Abigail Miyamoto, MD  DIAGNOSIS: No diagnosis found.   Cancer Staging  No matching staging information was found for the patient.  Stage 0 (cTis (DCIS), cN0, cM0) Left Breast, Intermediate grade ductal carcinoma in situ, ER+ / PR+ / Her2 not assessed  CHIEF COMPLAINT: Here to discuss management of left breast DCIS  HISTORY OF PRESENT ILLNESS::Vicki Krueger is a 72 y.o. female who presented with a left breast abnormality on the following imaging: bilateral screening mammogram on the date of 09/08/22. No symptoms, if any, were reported at that time. Diagnostic left breast mammogram on 10/01/22 further revealed an indeterminate group of calcifications in the outer left breast (at a middle depth) spanning an area measuring approximately 3 mm. (No evidence of axillary lymphadenopathy was appreciated, however this is in the absence of sonographic evaluation).  Biopsy of the left breast calcifications on date of 10/09/22 showed intermediate grade DCIS measuring 3 mm in the greatest linear extent of the sample, with necrosis.  ER status: 100% positive with strong staining intensity; PR status 80% positive with moderate staining intensity; Her2 not assessed. No lymph nodes were examined. An additional biopsy of the left breast was also collected and showed benign findings including mild fibrocystic changes with apocrine metaplasia  ***  PREVIOUS RADIATION THERAPY: No  PAST MEDICAL HISTORY:  has a past medical history of Acute meniscal tear of knee (RIGHT), Arthritis, Cataract immature, Esophageal stricture, GERD (gastroesophageal reflux disease), H/O cardiac murmur, H/O hiatal hernia, Heart murmur, Hepatitis (2006), History of esophageal  dilatation, History of hepatitis C (2006  PER BLOOD TEST -- TX FOR 1 YR--  NO SYMPTOMS SINCE--  POSS. DUE TO NEEDLE STICK  (PT A NURSE)), History of meningitis (X4  LAST HAD IN 2007), Hypertension, Intermittent palpitations (07-27-13), Left knee pain, and Swelling of right knee joint.    PAST SURGICAL HISTORY: Past Surgical History:  Procedure Laterality Date   APPENDECTOMY     BREAST BIOPSY Left 10/09/2022   MM LT BREAST BX W LOC DEV EA AD LESION IMG BX SPEC STEREO GUIDE 10/09/2022 [facility]   BREAST BIOPSY Left 10/09/2022   MM LT BREAST BX W LOC DEV 1ST LESION IMAGE BX SPEC STEREO GUIDE 10/09/2022 [facility]   CESAREAN SECTION  04/21/1987   COLONOSCOPY WITH PROPOFOL N/A 08/15/2013   Procedure: COLONOSCOPY WITH PROPOFOL;  Surgeon: Charolett Bumpers, MD;  Location: [facility];  Service: Endoscopy;  Laterality: N/A;   DILATION AND CURETTAGE OF UTERUS     DX LAPAROSCOPY  GYN   ESOPHAGEAL MANOMETRY N/A 09/25/2013   Procedure: ESOPHAGEAL MANOMETRY (EM);  Surgeon: Charolett Bumpers, MD;  Location: [facility];  Service: Endoscopy;  Laterality: N/A;   ESOPHAGOGASTRODUODENOSCOPY (EGD) WITH PROPOFOL N/A 08/15/2013   Procedure: ESOPHAGOGASTRODUODENOSCOPY (EGD) WITH PROPOFOL;  Surgeon: Charolett Bumpers, MD;  Location: [facility];  Service: Endoscopy;  Laterality: N/A;   HYSTEROSCOPY WITH D & C  07/20/2002   JOINT REPLACEMENT     s/p RTKA   KNEE ARTHROSCOPY  06/11/2011   Procedure: ARTHROSCOPY KNEE;  Surgeon: Erasmo Leventhal, MD;  Location: [facility];  Service: Orthopedics;;  with debridement  Local with MAC   LAPAROSCOPIC CHOLECYSTECTOMY  08/19/2002   LEFT KNEE ARTHROSCOPY  X2  LAST  ONE 2000   SHOULDER ARTHROSCOPY WITH ROTATOR CUFF REPAIR Left 04/15/2021   Procedure: SHOULDER ARTHROSCOPY WITH SUBACROMIAL DECOMPRESSION, DISTAL CLAVICLE RESECTION, ROTATOR CUFF REPAIR,BICEPS TENOTOMY, REMOVAL LOOSE BODIES;  Surgeon: Eugenia Mcalpine, MD;  Location: [facility];   Service: Orthopedics;  Laterality: Left;  with interscalene block   TONSILLECTOMY AND ADENOIDECTOMY  CHILD   TOTAL KNEE ARTHROPLASTY Right 08/10/2012   Procedure: TOTAL KNEE ARTHROPLASTY;  Surgeon: Loanne Drilling, MD;  Location: [facility];  Service: Orthopedics;  Laterality: Right;   TUBAL LIGATION      FAMILY HISTORY: family history is not on file.  SOCIAL HISTORY:  reports that she has never smoked. She has never used smokeless tobacco. She reports that she does not drink alcohol and does not use drugs.  ALLERGIES: Aspirin, Other, Pseudoephedrine, Stadol [butorphanol], Doxycycline, and Morphine and codeine  MEDICATIONS:  Current Outpatient Medications  Medication Sig Dispense Refill   amoxicillin (AMOXIL) 875 MG tablet Take 1 tablet (875 mg total) by mouth 2 (two) times daily until gone. (Patient not taking: Reported on 04/07/2021) 14 tablet 0   atorvastatin (LIPITOR) 10 MG tablet Take 1 tablet (10 mg total) by mouth daily. 90 tablet 3   benzonatate (TESSALON PERLES) 100 MG capsule Take 1 capsule (100 mg total) by mouth 3 (three) times daily as needed for cough (Patient not taking: Reported on 04/07/2021) 30 capsule 0   chlorhexidine (PERIDEX) 0.12 % solution Rinse with 1/2 ounce twice daily after brushing. do not eat, drink, or rinse for 30 minutes after using (Patient not taking: Reported on 04/07/2021) 946 mL 0   cholecalciferol (VITAMIN D) 1000 UNITS tablet Take 1,000 Units by mouth daily. (Patient not taking: Reported on 04/07/2021)     furosemide (LASIX) 20 MG tablet TAKE 1 TABLET BY MOUTH ONCE A DAY AS NEEDED FOR LEG EDEMA (Patient not taking: Reported on 04/07/2021) 30 tablet 6   furosemide (LASIX) 20 MG tablet Take 1 tablet (20 mg total) by mouth daily for 5 days a week 70 tablet 3   HYDROcodone bit-homatropine (HYCODAN) 5-1.5 MG/5ML syrup Take 5 mLs by mouth 2 (two) times daily as needed for cough for 10 days (Patient not taking: Reported on 04/07/2021) 100 mL 0    HYDROcodone-acetaminophen (NORCO/VICODIN) 5-325 MG tablet Take 1 tablet by mouth every 6 (six) hours as needed for severe pain. (Patient not taking: Reported on 04/07/2021) 6 tablet 0   ibuprofen (ADVIL) 600 MG tablet Take 1 tablet (600 mg total) by mouth every 6-8 hours as needed (Patient not taking: Reported on 04/07/2021) 20 tablet 0   methocarbamol (ROBAXIN) 500 MG tablet Take 1 tablet (500 mg total) by mouth 2 (two) times daily. (Patient not taking: Reported on 04/07/2021) 20 tablet 0   methocarbamol (ROBAXIN) 500 MG tablet Take 1 tablet (500 mg total) by mouth 4 (four) times daily. 40 tablet 0   metoprolol succinate (TOPROL-XL) 25 MG 24 hr tablet Take 1 tablet (25 mg total) by mouth daily. 90 tablet 3   Multiple Vitamin (MULTIVITAMIN WITH MINERALS) TABS tablet Take 1 tablet by mouth daily. (Patient not taking: Reported on 04/07/2021)     omeprazole (PRILOSEC) 20 MG capsule Take 1 capsule (20 mg total) by mouth 1 to 2 times daily. (Patient taking differently: Take 20 mg by mouth daily.) 180 capsule 3   omeprazole (PRILOSEC) 20 MG capsule Take 1 capsule (20 mg total) by mouth. 1 to 2 times per day 90 capsule 4   phentermine 15 MG capsule Take 1 capsule (  15 mg total) by mouth daily. (Patient not taking: Reported on 04/07/2021) 30 capsule 5   phentermine 15 MG capsule Take 1 capsule (15 mg total) by mouth daily. 30 capsule 5   predniSONE (DELTASONE) 5 MG tablet Take 5 tablets daily for 2 days and then decrease by one tablet every other day until gone (Patient not taking: Reported on 04/07/2021) 30 tablet 0   spironolactone (ALDACTONE) 25 MG tablet TAKE 2 TABLETS BY MOUTH ONCE DAILY 180 tablet 3   spironolactone (ALDACTONE) 25 MG tablet Take 2 tablets (50 mg total) by mouth daily once daily 180 tablet 3   topiramate (TOPAMAX) 50 MG tablet Take 1 tablet (50 mg total) by mouth 2 (two) times daily. 180 tablet 3   traMADol (ULTRAM) 50 MG tablet Take 1 tablet (50 mg total) by mouth 2-3 times daily as  needed for pain 90 tablet 2   traMADol (ULTRAM) 50 MG tablet Take 1 tablet (50 mg total) by mouth 2 to 3 (three) times daily as needed for pain 90 tablet 2   vitamin E 180 MG (400 UNITS) capsule Take 400 Units by mouth daily. (Patient not taking: Reported on 04/07/2021)     No current facility-administered medications for this encounter.   Facility-Administered Medications Ordered in Other Encounters  Medication Dose Route Frequency Provider Last Rate Last Admin   sodium chloride irrigation 0.9 %    PRN Eugenia Mcalpine, MD   6,000 mL at 06/12/11 0705    REVIEW OF SYSTEMS: As above in HPI.   PHYSICAL EXAM:  vitals were not taken for this visit.   General: Alert and oriented, in no acute distress HEENT: Head is normocephalic. Extraocular movements are intact. Oropharynx is clear. Neck: Neck is supple, no palpable cervical or supraclavicular lymphadenopathy. Heart: Regular in rate and rhythm with no murmurs, rubs, or gallops. Chest: Clear to auscultation bilaterally, with no rhonchi, wheezes, or rales. Abdomen: Soft, nontender, nondistended, with no rigidity or guarding. Extremities: No cyanosis or edema. Lymphatics: see Neck Exam Skin: No concerning lesions. Musculoskeletal: symmetric strength and muscle tone throughout. Neurologic: Cranial nerves II through XII are grossly intact. No obvious focalities. Speech is fluent. Coordination is intact. Psychiatric: Judgment and insight are intact. Affect is appropriate. Breasts: *** . No other palpable masses appreciated in the breasts or axillae *** .    ECOG = ***  0 - Asymptomatic (Fully active, able to carry on all predisease activities without restriction)  1 - Symptomatic but completely ambulatory (Restricted in physically strenuous activity but ambulatory and able to carry out work of a light or sedentary nature. For example, light housework, office work)  2 - Symptomatic, <50% in bed during the day (Ambulatory and capable of all  self care but unable to carry out any work activities. Up and about more than 50% of waking hours)  3 - Symptomatic, >50% in bed, but not bedbound (Capable of only limited self-care, confined to bed or chair 50% or more of waking hours)  4 - Bedbound (Completely disabled. Cannot carry on any self-care. Totally confined to bed or chair)  5 - Death   Santiago Glad MM, Creech RH, Tormey DC, et al. [phone]). "Toxicity and response criteria of the [facility]". Am. Evlyn Clines. Oncol. 5 (6): 649-55   LABORATORY DATA:  Lab Results  Component Value Date   WBC 6.7 04/09/2021   HGB 14.6 04/09/2021   HCT 45.1 04/09/2021   MCV 96.2 04/09/2021   PLT 194 04/09/2021  CMP     Component Value Date/Time   NA 144 04/09/2021 1357   K 3.7 04/09/2021 1357   CL 110 04/09/2021 1357   CO2 26 04/09/2021 1357   GLUCOSE 104 (H) 04/09/2021 1357   BUN 16 04/09/2021 1357   CREATININE 0.97 04/09/2021 1357   CALCIUM 9.2 04/09/2021 1357   PROT 7.5 03/07/2018 2202   ALBUMIN 4.1 03/07/2018 2202   AST 36 03/07/2018 2202   ALT 25 03/07/2018 2202   ALKPHOS 84 03/07/2018 2202   BILITOT 0.6 03/07/2018 2202   GFRNONAA >60 04/09/2021 1357   GFRAA >60 03/07/2018 2202         RADIOGRAPHY: MM LT BREAST BX W LOC DEV 1ST LESION IMAGE BX SPEC STEREO GUIDE  Addendum Date: 10/14/2022   ADDENDUM REPORT: 10/14/2022 07:46 ADDENDUM: Pathology revealed MILD FIBROCYSTIC CHANGE WITH APOCRINE METAPLASIA of the LEFT breast, lateral, (x clip). This was found to be concordant by Dr. Baird Lyons. Pathology revealed DUCTAL CARCINOMA IN SITU, INTERMEDIATE GRADE, NECROSIS: PRESENT, CALCIFICATIONS: PRESENT of the LEFT breast, lateral, (ribbon clip). This was found to be concordant by Dr. Baird Lyons. Pathology results were discussed with the patient by telephone. The patient reported doing well after the biopsies with tenderness at the sites. Post biopsy instructions and care were reviewed and questions were answered. The patient  was encouraged to call [facility] for any additional concerns. My direct phone number was provided. The patient was referred to [facility] on October 21, 2022. Pathology results reported by Rene Kocher, RN on 10/13/2022. Electronically Signed   By: Baird Lyons M.D.   On: 10/14/2022 07:46   Result Date: 10/14/2022 CLINICAL DATA:  Indeterminate left breast calcifications. EXAM: LEFT BREAST STEREOTACTIC CORE NEEDLE BIOPSY COMPARISON:  Previous exam(s). FINDINGS: The patient and I discussed the procedure of stereotactic-guided biopsy including benefits and alternatives. We discussed the high likelihood of a successful procedure. We discussed the risks of the procedure including infection, bleeding, tissue injury, clip migration, and inadequate sampling. Informed written consent was given. The usual time out protocol was performed immediately prior to the procedure. Using sterile technique and 1% lidocaine and 1% lidocaine with epinephrine as local anesthetic, under stereotactic guidance, a 9 gauge vacuum assisted device was used to perform core needle biopsy of calcifications in the lateral aspect of the right breast using a lateral to medial approach. Specimen radiograph did NOT show calcifications therefore the calcifications will be biopsied from a superior to inferior approach. Lesion quadrant: Lateral At the conclusion of the procedure, X shaped tissue marker clip was deployed into the biopsy cavity. Using sterile technique and 1% lidocaine and 1% lidocaine with epinephrine as local anesthetic, under stereotactic guidance, a 9 gauge vacuum assisted device was used to perform core needle biopsy of calcifications in the lateral aspect of the left breast using a superior to inferior approach. Specimen radiograph was performed showing calcifications were seen in the specimen radiographs. Specimens with calcifications  are identified for pathology. Lesion quadrant: Lateral At the conclusion of the procedure, ribbon shaped tissue marker clip was deployed into the biopsy cavity. Follow-up 2-view mammogram was performed and dictated separately. IMPRESSION: Stereotactic-guided biopsies of the left breast. No apparent complications. Electronically Signed: By: Baird Lyons M.D. On: 10/09/2022 09:41  MM LT BREAST BX W LOC DEV EA AD LESION IMG BX SPEC STEREO GUIDE  Addendum Date: 10/14/2022   ADDENDUM REPORT: 10/14/2022 07:46 ADDENDUM: Pathology  revealed MILD FIBROCYSTIC CHANGE WITH APOCRINE METAPLASIA of the LEFT breast, lateral, (x clip). This was found to be concordant by Dr. Baird Lyons. Pathology revealed DUCTAL CARCINOMA IN SITU, INTERMEDIATE GRADE, NECROSIS: PRESENT, CALCIFICATIONS: PRESENT of the LEFT breast, lateral, (ribbon clip). This was found to be concordant by Dr. Baird Lyons. Pathology results were discussed with the patient by telephone. The patient reported doing well after the biopsies with tenderness at the sites. Post biopsy instructions and care were reviewed and questions were answered. The patient was encouraged to call [facility] for any additional concerns. My direct phone number was provided. The patient was referred to [facility] on October 21, 2022. Pathology results reported by Rene Kocher, RN on 10/13/2022. Electronically Signed   By: Baird Lyons M.D.   On: 10/14/2022 07:46   Result Date: 10/14/2022 CLINICAL DATA:  Indeterminate left breast calcifications. EXAM: LEFT BREAST STEREOTACTIC CORE NEEDLE BIOPSY COMPARISON:  Previous exam(s). FINDINGS: The patient and I discussed the procedure of stereotactic-guided biopsy including benefits and alternatives. We discussed the high likelihood of a successful procedure. We discussed the risks of the procedure including infection, bleeding, tissue injury, clip  migration, and inadequate sampling. Informed written consent was given. The usual time out protocol was performed immediately prior to the procedure. Using sterile technique and 1% lidocaine and 1% lidocaine with epinephrine as local anesthetic, under stereotactic guidance, a 9 gauge vacuum assisted device was used to perform core needle biopsy of calcifications in the lateral aspect of the right breast using a lateral to medial approach. Specimen radiograph did NOT show calcifications therefore the calcifications will be biopsied from a superior to inferior approach. Lesion quadrant: Lateral At the conclusion of the procedure, X shaped tissue marker clip was deployed into the biopsy cavity. Using sterile technique and 1% lidocaine and 1% lidocaine with epinephrine as local anesthetic, under stereotactic guidance, a 9 gauge vacuum assisted device was used to perform core needle biopsy of calcifications in the lateral aspect of the left breast using a superior to inferior approach. Specimen radiograph was performed showing calcifications were seen in the specimen radiographs. Specimens with calcifications are identified for pathology. Lesion quadrant: Lateral At the conclusion of the procedure, ribbon shaped tissue marker clip was deployed into the biopsy cavity. Follow-up 2-view mammogram was performed and dictated separately. IMPRESSION: Stereotactic-guided biopsies of the left breast. No apparent complications. Electronically Signed: By: Baird Lyons M.D. On: 10/09/2022 09:41  MM CLIP PLACEMENT LEFT  Result Date: 10/09/2022 CLINICAL DATA:  Status post stereotactic biopsy left breast calcifications. EXAM: 3D DIAGNOSTIC LEFT MAMMOGRAM POST STEREOTACTIC BIOPSIES COMPARISON:  Previous exam(s). FINDINGS: 3D Mammographic images were obtained following stereotactic biopsies of the left breast. There is a ribbon shaped clip in appropriate position where the calcifications were biopsied. There is an X shaped clip  located 2.2 cm anterior. IMPRESSION: Appropriate positioning of the ribbon shaped clip where the calcifications were biopsied. In addition there is an X shaped clip 2.2 cm anterior to the ribbon shaped clip. Final Assessment: Post Procedure Mammograms for Marker Placement Electronically Signed   By: Baird Lyons M.D.   On: 10/09/2022 09:53  MM Digital Diagnostic Unilat L  Result Date: 10/01/2022 CLINICAL DATA:  72 year old female presents for further evaluation of LEFT breast calcifications identified on screening mammogram. EXAM: DIGITAL DIAGNOSTIC UNILATERAL LEFT MAMMOGRAM WITH CAD TECHNIQUE: Left digital diagnostic mammography was performed. COMPARISON:  Previous exam(s). ACR Breast Density  Category c: The breasts are heterogeneously dense, which may obscure small masses. FINDINGS: Full field and magnification views of the LEFT breast demonstrate a 0.3 cm group of amorphous/heterogeneous calcifications within the middle depth OUTER LEFT breast. IMPRESSION: Indeterminate 0.3 cm group of OUTER LEFT breast calcifications. Tissue sampling is recommended. RECOMMENDATION: Stereotactic guided LEFT breast biopsy, which will be scheduled. I have discussed the findings and recommendations with the patient. If applicable, a reminder letter will be sent to the patient regarding the next appointment. BI-RADS CATEGORY  4: Suspicious. Electronically Signed   By: Harmon Pier M.D.   On: 10/01/2022 14:03     IMPRESSION/PLAN: ***   It was a pleasure meeting the patient today. We discussed the risks, benefits, and side effects of radiotherapy. I recommend radiotherapy to the *** to reduce her risk of locoregional recurrence by 2/3.  We discussed that radiation would take approximately *** weeks to complete and that I would give the patient a few weeks to heal following surgery before starting treatment planning. *** If chemotherapy were to be given, this would precede radiotherapy. We spoke about acute effects including skin  irritation and fatigue as well as much less common late effects including internal organ injury or irritation. We spoke about the latest technology that is used to minimize the risk of late effects for patients undergoing radiotherapy to the breast or chest wall. No guarantees of treatment were given. The patient is enthusiastic about proceeding with treatment. I look forward to participating in the patient's care.  I will await her referral back to me for postoperative follow-up and eventual CT simulation/treatment planning.  On date of service, in total, I spent *** minutes on this encounter. Patient was seen in person.   __________________________________________   Lonie Peak, MD  This document serves as a record of services personally performed by Lonie Peak, MD. It was created on her behalf by Neena Rhymes, a trained medical scribe. The creation of this record is based on the scribe's personal observations and the provider's statements to them. This document has been checked and approved by the attending provider.

## 2022-10-21 ENCOUNTER — Ambulatory Visit: Payer: Commercial Managed Care - PPO | Admitting: Physical Therapy

## 2022-10-21 ENCOUNTER — Other Ambulatory Visit: Payer: Self-pay | Admitting: Surgery

## 2022-10-21 ENCOUNTER — Ambulatory Visit
Admission: RE | Admit: 2022-10-21 | Discharge: 2022-10-21 | Disposition: A | Discharge status: Home / Self Care | Payer: Commercial Managed Care - PPO | Source: Ambulatory Visit | Attending: Radiation Oncology | Admitting: Radiation Oncology

## 2022-10-21 ENCOUNTER — Encounter: Payer: Self-pay | Admitting: Genetic Counselor

## 2022-10-21 ENCOUNTER — Encounter: Payer: Self-pay | Admitting: *Deleted

## 2022-10-21 ENCOUNTER — Inpatient Hospital Stay (HOSPITAL_BASED_OUTPATIENT_CLINIC_OR_DEPARTMENT_OTHER): Payer: Commercial Managed Care - PPO | Admitting: Genetic Counselor

## 2022-10-21 ENCOUNTER — Inpatient Hospital Stay: Payer: Commercial Managed Care - PPO

## 2022-10-21 ENCOUNTER — Encounter: Payer: Self-pay | Admitting: Radiation Oncology

## 2022-10-21 ENCOUNTER — Other Ambulatory Visit: Payer: Self-pay

## 2022-10-21 ENCOUNTER — Inpatient Hospital Stay: Payer: Commercial Managed Care - PPO | Attending: Hematology and Oncology | Admitting: Hematology and Oncology

## 2022-10-21 VITALS — BP 128/68 | HR 72 | Temp 97.9°F | Resp 18 | Ht 59.0 in | Wt 177.4 lb

## 2022-10-21 DIAGNOSIS — D0512 Intraductal carcinoma in situ of left breast: Secondary | ICD-10-CM

## 2022-10-21 DIAGNOSIS — Z170 Estrogen receptor positive status [ER+]: Secondary | ICD-10-CM

## 2022-10-21 DIAGNOSIS — Z8041 Family history of malignant neoplasm of ovary: Secondary | ICD-10-CM | POA: Diagnosis not present

## 2022-10-21 DIAGNOSIS — Z803 Family history of malignant neoplasm of breast: Secondary | ICD-10-CM

## 2022-10-21 DIAGNOSIS — Z800 Family history of malignant neoplasm of digestive organs: Secondary | ICD-10-CM

## 2022-10-21 LAB — CBC WITH DIFFERENTIAL (CANCER CENTER ONLY)
Abs Immature Granulocytes: 0.03 10*3/uL (ref 0.00–0.07)
Basophils Absolute: 0 10*3/uL (ref 0.0–0.1)
Basophils Relative: 0 %
Eosinophils Absolute: 0 10*3/uL (ref 0.0–0.5)
Eosinophils Relative: 1 %
HCT: 44 % (ref 36.0–46.0)
Hemoglobin: 14.3 g/dL (ref 12.0–15.0)
Immature Granulocytes: 0 %
Lymphocytes Relative: 36 %
Lymphs Abs: 2.4 10*3/uL (ref 0.7–4.0)
MCH: 31.3 pg (ref 26.0–34.0)
MCHC: 32.5 g/dL (ref 30.0–36.0)
MCV: 96.3 fL (ref 80.0–100.0)
Monocytes Absolute: 0.5 10*3/uL (ref 0.1–1.0)
Monocytes Relative: 7 %
Neutro Abs: 3.8 10*3/uL (ref 1.7–7.7)
Neutrophils Relative %: 56 %
Platelet Count: 193 10*3/uL (ref 150–400)
RBC: 4.57 MIL/uL (ref 3.87–5.11)
RDW: 12.6 % (ref 11.5–15.5)
WBC Count: 6.8 10*3/uL (ref 4.0–10.5)
nRBC: 0 % (ref 0.0–0.2)

## 2022-10-21 LAB — CMP (CANCER CENTER ONLY)
ALT: 21 U/L (ref 0–44)
AST: 22 U/L (ref 15–41)
Albumin: 4.1 g/dL (ref 3.5–5.0)
Alkaline Phosphatase: 99 U/L (ref 38–126)
Anion gap: 8 (ref 5–15)
BUN: 15 mg/dL (ref 8–23)
CO2: 27 mmol/L (ref 22–32)
Calcium: 9.9 mg/dL (ref 8.9–10.3)
Chloride: 108 mmol/L (ref 98–111)
Creatinine: 1.16 mg/dL — ABNORMAL HIGH (ref 0.44–1.00)
GFR, Estimated: 51 mL/min — ABNORMAL LOW (ref >60)
Glucose, Bld: 98 mg/dL (ref 70–99)
Potassium: 3.9 mmol/L (ref 3.5–5.1)
Sodium: 143 mmol/L (ref 135–145)
Total Bilirubin: 0.3 mg/dL (ref 0.3–1.2)
Total Protein: 6.7 g/dL (ref 6.5–8.1)

## 2022-10-21 LAB — GENETIC SCREENING ORDER

## 2022-10-21 NOTE — Progress Notes (Signed)
REFERRING PROVIDER: Serena Croissant, MD [address] [place],  [place] [address]  PRIMARY PROVIDER:  Marden Noble, MD (Inactive)  PRIMARY REASON FOR VISIT:  1. Ductal carcinoma in situ (DCIS) of left breast   2. Family history of breast cancer   3. Family history of colon cancer   4. Family history of ovarian cancer     HISTORY OF PRESENT ILLNESS:   Vicki Krueger, a 72 y.o. female, was seen for a [place] cancer genetics consultation at the request of Dr. Pamelia Hoit due to a personal history of breast cancer.  Vicki Krueger presents to clinic today to discuss the possibility of a hereditary predisposition to cancer, to discuss genetic testing, and to further clarify her future cancer risks, as well as potential cancer risks for family members.   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 The treatment plan is pending.    CANCER HISTORY:  Oncology History  Ductal carcinoma in situ (DCIS) of left breast  10/09/2022 Initial Diagnosis   Screening mammogram detected left breast calcifications measuring 0.3 cm, stereotactic biopsy with intermediate grade DCIS with necrosis and calcifications ER 100%, PR 80%   10/21/2022 Cancer Staging   Staging form: Breast, AJCC 8th Edition - Clinical: Stage 0 (cTis (DCIS), cN0, cM0, G2, ER+, PR+, HER2: Not Assessed) - Signed by Serena Croissant, MD on 10/21/2022 Stage prefix: Initial diagnosis Histologic grading system: 3 grade system      Past Medical History:  Diagnosis Date   Acute meniscal tear of knee RIGHT   Arthritis    Cataract immature    Esophageal stricture    GERD (gastroesophageal reflux disease)    H/O cardiac murmur    H/O hiatal hernia    Heart murmur    Hepatitis 04/20/2004   treatment x 1 year no problem now   History of esophageal dilatation    History of hepatitis C 2006  PER BLOOD TEST -- TX FOR 1 YR--  NO SYMPTOMS SINCE--  POSS. DUE TO NEEDLE STICK   (PT A NURSE)   History of meningitis X4  LAST HAD IN 2007   Hypertension    Hypertension    Intermittent palpitations 07/27/2013   occasional-tx. Metoprolol as needed   Left knee pain    Swelling of right knee joint     Past Surgical History:  Procedure Laterality Date   APPENDECTOMY     APPENDECTOMY     BREAST BIOPSY Left 10/09/2022   MM LT BREAST BX W LOC DEV EA AD LESION IMG BX SPEC STEREO GUIDE 10/09/2022 [facility]   BREAST BIOPSY Left 10/09/2022   MM LT BREAST BX W LOC DEV 1ST LESION IMAGE BX SPEC STEREO GUIDE 10/09/2022 [facility]   CESAREAN SECTION  04/21/1987   COLONOSCOPY WITH PROPOFOL N/A 08/15/2013   Procedure: COLONOSCOPY WITH PROPOFOL;  Surgeon: Charolett Bumpers, MD;  Location: [facility];  Service: Endoscopy;  Laterality: N/A;   DILATION AND CURETTAGE OF UTERUS     DX LAPAROSCOPY  GYN   ESOPHAGEAL MANOMETRY N/A 09/25/2013   Procedure: ESOPHAGEAL MANOMETRY (EM);  Surgeon: Charolett Bumpers, MD;  Location: [facility];  Service: Endoscopy;  Laterality: N/A;   ESOPHAGOGASTRODUODENOSCOPY (EGD) WITH PROPOFOL N/A 08/15/2013   Procedure: ESOPHAGOGASTRODUODENOSCOPY (EGD) WITH PROPOFOL;  Surgeon: Charolett Bumpers, MD;  Location: [facility];  Service: Endoscopy;  Laterality: N/A;   HYSTEROSCOPY WITH D & C  07/20/2002  JOINT REPLACEMENT     s/p RTKA   KNEE ARTHROSCOPY  06/11/2011   Procedure: ARTHROSCOPY KNEE;  Surgeon: Erasmo Leventhal, MD;  Location: [facility];  Service: Orthopedics;;  with debridement  Local with MAC   LAPAROSCOPIC CHOLECYSTECTOMY  08/19/2002   LEFT KNEE ARTHROSCOPY  X2  LAST ONE 2000   SHOULDER ARTHROSCOPY WITH ROTATOR CUFF REPAIR Left 04/15/2021   Procedure: SHOULDER ARTHROSCOPY WITH SUBACROMIAL DECOMPRESSION, DISTAL CLAVICLE RESECTION, ROTATOR CUFF REPAIR,BICEPS TENOTOMY, REMOVAL LOOSE BODIES;  Surgeon: Eugenia Mcalpine, MD;  Location: [facility];  Service: Orthopedics;  Laterality: Left;  with interscalene block    TONSILLECTOMY AND ADENOIDECTOMY  CHILD   TOTAL KNEE ARTHROPLASTY Right 08/10/2012   Procedure: TOTAL KNEE ARTHROPLASTY;  Surgeon: Loanne Drilling, MD;  Location: [facility];  Service: Orthopedics;  Laterality: Right;   TUBAL LIGATION      FAMILY HISTORY:  We obtained a detailed, 4-generation family history.  Significant diagnoses are listed below: Family History  Problem Relation Age of Onset   Lung cancer Father 39   Lung cancer Paternal Aunt        dx 86s   Lung cancer Paternal Uncle        dx 74s   Colon cancer Maternal Grandfather        dx 19s   Skin cancer Paternal Grandmother    Breast cancer Cousin        pat female cousin; dx 59s   Ovarian cancer Cousin        x2 pat cousins; dx 30s and dx 71s    Vicki Krueger is unaware of previous family history of genetic testing for hereditary cancer risks. There is no reported Ashkenazi Jewish ancestry. There is no known consanguinity.  GENETIC COUNSELING ASSESSMENT: Vicki Krueger is a 72 y.o. female with a personal and family history which is somewhat suggestive of a hereditary cancer syndrome and predisposition to cancer given the presence of related cancers (breast, ovarian) in the family. We, therefore, discussed and recommended the following at today's visit.   DISCUSSION: We discussed that 5 - 10% of cancer is hereditary.  Most cases of hereditary breast cancer are associated with mutations in BRCA1/2.  There are other genes that can be associated with hereditary breast cancer syndromes.  We discussed that testing is beneficial for several reasons including knowing how to follow individuals for their cancer risks and understanding if other family members could be at risk for cancer and allowing them to undergo genetic testing.   We reviewed the characteristics, features and inheritance patterns of hereditary cancer syndromes. We also discussed genetic testing, including the appropriate family members to test, the process of testing, insurance  coverage and turn-around-time for results. We discussed the implications of a negative, positive, carrier and/or variant of uncertain significant result. We recommended Vicki Krueger pursue genetic testing for a panel that includes genes associated with breast, ovarian, colon, and other cancers.    The [facility] Common Hereditary Cancers + RNA Panel includes sequencing, deletion/duplication, and RNA analysis of the following 48 genes: APC, ATM, AXIN2, BAP1, BARD1, BMPR1A, BRCA1, BRCA2, BRIP1, CDH1, CDK4*, CDKN2A*, CHEK2, CTNNA1, DICER1, EPCAM* (del/dup only), FH, GREM1* (promoter dup analysis only), HOXB13*, KIT*, MBD4*, MEN1, MLH1, MSH2, MSH3, MSH6, MUTYH, NF1, NTHL1, PALB2, PDGFRA*, PMS2, POLD1, POLE, PTEN, RAD51C, RAD51D, SDHA (sequencing only), SDHB, SDHC, SDHD, SMAD4, SMARCA4, STK11, TP53, TSC1, TSC2, VHL.  *Genes without RNA analysis.   Based on Vicki Krueger's personal history of breast cancer and family history  of ovarian cancer, she meets medical criteria for genetic testing. Despite that she meets criteria, she may still have an out of pocket cost. We discussed that if her out of pocket cost for testing is over $100, the laboratory should contact her and discuss the self-pay prices and/or patient pay assistance programs.    PLAN: After considering the risks, benefits, and limitations, Vicki Krueger provided informed consent to pursue genetic testing and the blood sample was sent to [facility] for analysis of the Common Hereditary Cancers +RNA Panel. Results should be available within approximately 3 weeks' time, at which point they will be disclosed by telephone to Vicki Krueger, as will any additional recommendations warranted by these results. Vicki Krueger will receive a summary of her genetic counseling visit and a copy of her results once available. This information will also be available in Epic.    Vicki Krueger questions were answered to her satisfaction today. Our contact information was provided  should additional questions or concerns arise. Thank you for the referral and allowing [place] to share in the care of your patient.   Emaleigh Guimond M. Rennie Plowman, MS, [facility] Genetic Counselor [email or web address] (P) [phone]  The patient was seen for a total of 16 minutes in face-to-face genetic counseling.  She was accompanied by her husband.  Dr. Pamelia Hoit was available to discuss this case as needed.    _______________________________________________________________________ For Office Staff:  Number of people involved in session: 2 Was an Intern/ student involved with case: no

## 2022-10-21 NOTE — Assessment & Plan Note (Signed)
10/09/2022:Screening mammogram detected left breast calcifications measuring 0.3 cm, stereotactic biopsy with intermediate grade DCIS with necrosis and calcifications ER 100%, PR 80%  Pathology review: I discussed with the patient the difference between DCIS and invasive breast cancer. It is considered a precancerous lesion. DCIS is classified as a 0. It is generally detected through mammograms as calcifications. We discussed the significance of grades and its impact on prognosis. We also discussed the importance of ER and PR receptors and their implications to adjuvant treatment options. Prognosis of DCIS dependence on grade, comedo necrosis. It is anticipated that if not treated, 20-30% of DCIS can develop into invasive breast cancer.  Recommendation: 1. Breast conserving surgery 2. Followed by adjuvant radiation therapy 3. Followed by antiestrogen therapy with tamoxifen 5 years  Tamoxifen counseling: We discussed the risks and benefits of tamoxifen. These include but not limited to insomnia, hot flashes, mood changes, vaginal dryness, and weight gain. Although rare, serious side effects including endometrial cancer, risk of blood clots were also discussed. We strongly believe that the benefits far outweigh the risks. Patient understands these risks and consented to starting treatment. Planned treatment duration is 5 years.  Return to clinic after surgery to discuss the final pathology report and come up with an adjuvant treatment plan.

## 2022-10-21 NOTE — Progress Notes (Signed)
[facility] CONSULT NOTE  Patient Care Team: Marden Noble, MD (Inactive) as PCP - General (Internal Medicine) Serena Croissant, MD as Consulting Physician (Hematology and Oncology) Lonie Peak, MD as Attending Physician (Radiation Oncology) Abigail Miyamoto, MD as Consulting Physician (General Surgery) Donnelly Angelica, RN as Oncology Nurse Navigator Pershing Proud, RN as Oncology Nurse Navigator  CHIEF COMPLAINTS/PURPOSE OF CONSULTATION:  Newly diagnosed breast cancer  HISTORY OF PRESENTING ILLNESS:  Vicki Krueger 72 y.o. female is here because of recent diagnosis of left breast cancer.  Patient had routine screening mammogram that detected left breast calcifications measuring 0.3 cm.  Stereotactic biopsy revealed intermediate grade DCIS was ER/PR positive.  She was present this morning in the multidisciplinary tumor board and she is here today to discuss treatment plan.  I reviewed her records extensively and collaborated the history with the patient.  SUMMARY OF ONCOLOGIC HISTORY: Oncology History  Ductal carcinoma in situ (DCIS) of left breast  10/09/2022 Initial Diagnosis   Screening mammogram detected left breast calcifications measuring 0.3 cm, stereotactic biopsy with intermediate grade DCIS with necrosis and calcifications ER 100%, PR 80%   10/21/2022 Cancer Staging   Staging form: Breast, AJCC 8th Edition - Clinical: Stage 0 (cTis (DCIS), cN0, cM0, G2, ER+, PR+, HER2: Not Assessed) - Signed by Serena Croissant, MD on 10/21/2022 Stage prefix: Initial diagnosis Histologic grading system: 3 grade system      MEDICAL HISTORY:  Past Medical History:  Diagnosis Date   Acute meniscal tear of knee RIGHT   Arthritis    Cataract immature    Esophageal stricture    GERD (gastroesophageal reflux disease)    H/O cardiac murmur    H/O hiatal hernia    Heart murmur    Hepatitis 04/20/2004   treatment x 1 year no problem now   History of esophageal dilatation     History of hepatitis C 2006  PER BLOOD TEST -- TX FOR 1 YR--  NO SYMPTOMS SINCE--  POSS. DUE TO NEEDLE STICK  (PT A NURSE)   History of meningitis X4  LAST HAD IN 2007   Hypertension    Hypertension    Intermittent palpitations 07/27/2013   occasional-tx. Metoprolol as needed   Left knee pain    Swelling of right knee joint     SURGICAL HISTORY: Past Surgical History:  Procedure Laterality Date   APPENDECTOMY     APPENDECTOMY     BREAST BIOPSY Left 10/09/2022   MM LT BREAST BX W LOC DEV EA AD LESION IMG BX SPEC STEREO GUIDE 10/09/2022 [facility]   BREAST BIOPSY Left 10/09/2022   MM LT BREAST BX W LOC DEV 1ST LESION IMAGE BX SPEC STEREO GUIDE 10/09/2022 [facility]   CESAREAN SECTION  04/21/1987   COLONOSCOPY WITH PROPOFOL N/A 08/15/2013   Procedure: COLONOSCOPY WITH PROPOFOL;  Surgeon: Charolett Bumpers, MD;  [facility]: [facility];  Service: Endoscopy;  Laterality: N/A;   DILATION AND CURETTAGE OF UTERUS     DX LAPAROSCOPY  GYN   ESOPHAGEAL MANOMETRY N/A 09/25/2013   Procedure: ESOPHAGEAL MANOMETRY (EM);  Surgeon: Charolett Bumpers, MD;  Location: [facility];  Service: Endoscopy;  Laterality: N/A;   ESOPHAGOGASTRODUODENOSCOPY (EGD) WITH PROPOFOL N/A 08/15/2013   Procedure: ESOPHAGOGASTRODUODENOSCOPY (EGD) WITH PROPOFOL;  Surgeon: Charolett Bumpers, MD;  [facility]: [facility];  Service: Endoscopy;  Laterality: N/A;   HYSTEROSCOPY WITH D & C  07/20/2002   JOINT REPLACEMENT     s/p RTKA   KNEE ARTHROSCOPY  06/11/2011   Procedure: ARTHROSCOPY KNEE;  Surgeon: Erasmo Leventhal, MD;  Location: [facility];  Service: Orthopedics;;  with debridement  Local with MAC   LAPAROSCOPIC CHOLECYSTECTOMY  08/19/2002   LEFT KNEE ARTHROSCOPY  X2  LAST ONE 2000   SHOULDER ARTHROSCOPY WITH ROTATOR CUFF REPAIR Left 04/15/2021   Procedure: SHOULDER ARTHROSCOPY WITH SUBACROMIAL DECOMPRESSION, DISTAL CLAVICLE RESECTION, ROTATOR CUFF REPAIR,BICEPS TENOTOMY, REMOVAL LOOSE  BODIES;  Surgeon: Eugenia Mcalpine, MD;  Location: [facility];  Service: Orthopedics;  Laterality: Left;  with interscalene block   TONSILLECTOMY AND ADENOIDECTOMY  CHILD   TOTAL KNEE ARTHROPLASTY Right 08/10/2012   Procedure: TOTAL KNEE ARTHROPLASTY;  Surgeon: Loanne Drilling, MD;  Location: [facility];  Service: Orthopedics;  Laterality: Right;   TUBAL LIGATION      SOCIAL HISTORY: Social History   Socioeconomic History   Marital status: Married    Spouse name: Not on file   Number of children: Not on file   Years of education: Not on file   Highest education level: Not on file  Occupational History   Not on file  Tobacco Use   Smoking status: Never   Smokeless tobacco: Never  Vaping Use   Vaping Use: Never used  Substance and Sexual Activity   Alcohol use: No   Drug use: No   Sexual activity: Not on file  Other Topics Concern   Not on file  Social History Narrative   Not on file   Social Determinants of Health   Financial Resource Strain: Not on file  Food Insecurity: Not on file  Transportation Needs: Not on file  Physical Activity: Not on file  Stress: Not on file  Social Connections: Not on file  Intimate Partner Violence: Not on file    FAMILY HISTORY: Family History  Problem Relation Age of Onset   Lung cancer Father    Colon cancer Maternal Grandfather    Lung cancer Paternal Uncle    Lung cancer Maternal Aunt    Breast cancer Cousin     ALLERGIES:  is allergic to ergotamine-caffeine, aspirin, other, pseudoephedrine, stadol [butorphanol], doxycycline, and morphine and codeine.  MEDICATIONS:  Current Outpatient Medications  Medication Sig Dispense Refill   atorvastatin (LIPITOR) 10 MG tablet Take 1 tablet (10 mg total) by mouth daily. 90 tablet 3   benzonatate (TESSALON PERLES) 100 MG capsule Take 1 capsule (100 mg total) by mouth 3 (three) times daily as needed for cough (Patient not taking: Reported on 04/07/2021) 30 capsule 0   chlorhexidine (PERIDEX)  0.12 % solution Rinse with 1/2 ounce twice daily after brushing. do not eat, drink, or rinse for 30 minutes after using (Patient not taking: Reported on 04/07/2021) 946 mL 0   cholecalciferol (VITAMIN D) 1000 UNITS tablet Take 1,000 Units by mouth daily. (Patient not taking: Reported on 04/07/2021)     furosemide (LASIX) 20 MG tablet TAKE 1 TABLET BY MOUTH ONCE A DAY AS NEEDED FOR LEG EDEMA (Patient not taking: Reported on 04/07/2021) 30 tablet 6   furosemide (LASIX) 20 MG tablet Take 1 tablet (20 mg total) by mouth daily for 5 days a week 70 tablet 3   HYDROcodone bit-homatropine (HYCODAN) 5-1.5 MG/5ML syrup Take 5 mLs by mouth 2 (two) times daily as needed for cough for 10 days (Patient not taking: Reported on 04/07/2021) 100 mL 0   HYDROcodone-acetaminophen (NORCO/VICODIN) 5-325 MG tablet Take 1 tablet by mouth every 6 (six) hours as needed for severe pain. (Patient not taking: Reported  on 04/07/2021) 6 tablet 0   ibuprofen (ADVIL) 600 MG tablet Take 1 tablet (600 mg total) by mouth every 6-8 hours as needed (Patient not taking: Reported on 04/07/2021) 20 tablet 0   methocarbamol (ROBAXIN) 500 MG tablet Take 1 tablet (500 mg total) by mouth 2 (two) times daily. (Patient not taking: Reported on 04/07/2021) 20 tablet 0   methocarbamol (ROBAXIN) 500 MG tablet Take 1 tablet (500 mg total) by mouth 4 (four) times daily. 40 tablet 0   metoprolol succinate (TOPROL-XL) 25 MG 24 hr tablet Take 1 tablet (25 mg total) by mouth daily. 90 tablet 3   Multiple Vitamin (MULTIVITAMIN WITH MINERALS) TABS tablet Take 1 tablet by mouth daily. (Patient not taking: Reported on 04/07/2021)     omeprazole (PRILOSEC) 20 MG capsule Take 1 capsule (20 mg total) by mouth 1 to 2 times daily. (Patient taking differently: Take 20 mg by mouth daily.) 180 capsule 3   omeprazole (PRILOSEC) 20 MG capsule Take 1 capsule (20 mg total) by mouth. 1 to 2 times per day 90 capsule 4   phentermine 15 MG capsule Take 1 capsule (15 mg total) by  mouth daily. (Patient not taking: Reported on 04/07/2021) 30 capsule 5   phentermine 15 MG capsule Take 1 capsule (15 mg total) by mouth daily. 30 capsule 5   predniSONE (DELTASONE) 5 MG tablet Take 5 tablets daily for 2 days and then decrease by one tablet every other day until gone (Patient not taking: Reported on 04/07/2021) 30 tablet 0   spironolactone (ALDACTONE) 25 MG tablet TAKE 2 TABLETS BY MOUTH ONCE DAILY 180 tablet 3   spironolactone (ALDACTONE) 25 MG tablet Take 2 tablets (50 mg total) by mouth daily once daily 180 tablet 3   topiramate (TOPAMAX) 50 MG tablet Take 1 tablet (50 mg total) by mouth 2 (two) times daily. 180 tablet 3   traMADol (ULTRAM) 50 MG tablet Take 1 tablet (50 mg total) by mouth 2-3 times daily as needed for pain 90 tablet 2   traMADol (ULTRAM) 50 MG tablet Take 1 tablet (50 mg total) by mouth 2 to 3 (three) times daily as needed for pain 90 tablet 2   vitamin E 180 MG (400 UNITS) capsule Take 400 Units by mouth daily. (Patient not taking: Reported on 04/07/2021)     No current facility-administered medications for this visit.   Facility-Administered Medications Ordered in Other Visits  Medication Dose Route Frequency Provider Last Rate Last Admin   sodium chloride irrigation 0.9 %    PRN Eugenia Mcalpine, MD   6,000 mL at 06/12/11 0705    REVIEW OF SYSTEMS:   Constitutional: Denies fevers, chills or abnormal night sweats Breast:  Denies any palpable lumps or discharge All other systems were reviewed with the patient and are negative.  PHYSICAL EXAMINATION: ECOG PERFORMANCE STATUS: 0 - Asymptomatic  Vitals:   10/21/22 1310  BP: 128/68  Pulse: 72  Resp: 18  Temp: 97.9 F (36.6 C)  SpO2: 100%   Filed Weights   10/21/22 1310  Weight: 177 lb 6.4 oz (80.5 kg)    GENERAL:alert, no distress and comfortable    LABORATORY DATA:  I have reviewed the data as listed Lab Results  Component Value Date   WBC 6.8 10/21/2022   HGB 14.3 10/21/2022   HCT  44.0 10/21/2022   MCV 96.3 10/21/2022   PLT 193 10/21/2022   Lab Results  Component Value Date   NA 143 10/21/2022   K 3.9  10/21/2022   CL 108 10/21/2022   CO2 27 10/21/2022    RADIOGRAPHIC STUDIES: I have personally reviewed the radiological reports and agreed with the findings in the report.  ASSESSMENT AND PLAN:  Ductal carcinoma in situ (DCIS) of left breast 10/09/2022:Screening mammogram detected left breast calcifications measuring 0.3 cm, stereotactic biopsy with intermediate grade DCIS with necrosis and calcifications ER 100%, PR 80%  Pathology review: I discussed with the patient the difference between DCIS and invasive breast cancer. It is considered a precancerous lesion. DCIS is classified as a 0. It is generally detected through mammograms as calcifications. We discussed the significance of grades and its impact on prognosis. We also discussed the importance of ER and PR receptors and their implications to adjuvant treatment options. Prognosis of DCIS dependence on grade, comedo necrosis. It is anticipated that if not treated, 20-30% of DCIS can develop into invasive breast cancer.  Recommendation: 1. Breast conserving surgery 2. Followed by adjuvant radiation therapy 3. Followed by antiestrogen therapy with tamoxifen 5 years  Tamoxifen counseling: We discussed the risks and benefits of tamoxifen. These include but not limited to insomnia, hot flashes, mood changes, vaginal dryness, and weight gain. Although rare, serious side effects including endometrial cancer, risk of blood clots were also discussed. We strongly believe that the benefits far outweigh the risks. Patient understands these risks and consented to starting treatment. Planned treatment duration is 5 years.  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 Return to clinic after surgery to discuss the final pathology report and come up with an  adjuvant treatment plan.   All questions were answered. The patient knows to call the clinic with any problems, questions or concerns.    Vicki Meek, MD 10/21/22

## 2022-10-29 ENCOUNTER — Encounter: Payer: Self-pay | Admitting: *Deleted

## 2022-10-29 ENCOUNTER — Telehealth: Payer: Self-pay | Admitting: *Deleted

## 2022-10-29 DIAGNOSIS — D0512 Intraductal carcinoma in situ of left breast: Secondary | ICD-10-CM

## 2022-10-29 NOTE — Telephone Encounter (Signed)
Left message for a return phone call to follow up from [facility] 7/3 and assess navigation needs.

## 2022-10-30 ENCOUNTER — Other Ambulatory Visit: Payer: Self-pay | Admitting: Surgery

## 2022-10-30 ENCOUNTER — Encounter: Payer: Self-pay | Admitting: Genetic Counselor

## 2022-10-30 ENCOUNTER — Telehealth: Payer: Self-pay | Admitting: Genetic Counselor

## 2022-10-30 DIAGNOSIS — D0512 Intraductal carcinoma in situ of left breast: Secondary | ICD-10-CM

## 2022-10-30 DIAGNOSIS — Z1379 Encounter for other screening for genetic and chromosomal anomalies: Secondary | ICD-10-CM | POA: Insufficient documentation

## 2022-10-30 NOTE — Telephone Encounter (Signed)
Called home number in attempt to disclose genetics.  Left call back instructions with patient's husband.

## 2022-11-02 ENCOUNTER — Telehealth: Payer: Self-pay | Admitting: Hematology and Oncology

## 2022-11-02 NOTE — Telephone Encounter (Signed)
Left a message regarding next upcoming appointment times/dates

## 2022-11-03 ENCOUNTER — Encounter (HOSPITAL_BASED_OUTPATIENT_CLINIC_OR_DEPARTMENT_OTHER): Payer: Self-pay | Admitting: Surgery

## 2022-11-03 ENCOUNTER — Ambulatory Visit: Payer: Self-pay | Admitting: Licensed Clinical Social Worker

## 2022-11-03 ENCOUNTER — Telehealth: Payer: Self-pay | Admitting: Licensed Clinical Social Worker

## 2022-11-03 DIAGNOSIS — Z1379 Encounter for other screening for genetic and chromosomal anomalies: Secondary | ICD-10-CM

## 2022-11-03 NOTE — Progress Notes (Signed)
HPI:   Vicki Krueger was previously seen in the [facility] due to a personal and family history of cancer and concerns regarding a hereditary predisposition to cancer. Please refer to our prior cancer genetics clinic note for more information regarding our discussion, assessment and recommendations, at the time. Vicki Krueger recent genetic test results were disclosed to her, as were recommendations warranted by these results. These results and recommendations are discussed in more detail below.  CANCER HISTORY:  Oncology History  Ductal carcinoma in situ (DCIS) of left breast  10/09/2022 Initial Diagnosis   Screening mammogram detected left breast calcifications measuring 0.3 cm, stereotactic biopsy with intermediate grade DCIS with necrosis and calcifications ER 100%, PR 80%   10/21/2022 Cancer Staging   Staging form: Breast, AJCC 8th Edition - Clinical: Stage 0 (cTis (DCIS), cN0, cM0, G2, ER+, PR+, HER2: Not Assessed) - Signed by Serena Croissant, MD on 10/21/2022 Stage prefix: Initial diagnosis Histologic grading system: 3 grade system   10/27/2022 Genetic Testing   Negative [facility] Common Hereditary Cancers +RNA Panel.  Report date is 10/27/2022.    The [facility] Common Hereditary Cancers + RNA Panel includes sequencing, deletion/duplication, and RNA analysis of the following 48 genes: APC, ATM, AXIN2, BAP1, BARD1, BMPR1A, BRCA1, BRCA2, BRIP1, CDH1, CDK4*, CDKN2A*, CHEK2, CTNNA1, DICER1, EPCAM* (del/dup only), FH, GREM1* (promoter dup analysis only), HOXB13*, KIT*, MBD4*, MEN1, MLH1, MSH2, MSH3, MSH6, MUTYH, NF1, NTHL1, PALB2, PDGFRA*, PMS2, POLD1, POLE, PTEN, RAD51C, RAD51D, SDHA (sequencing only), SDHB, SDHC, SDHD, SMAD4, SMARCA4, STK11, TP53, TSC1, TSC2, VHL.  *Genes without RNA analysis.      FAMILY HISTORY:  We obtained a detailed, 4-generation family history.  Significant diagnoses are listed below: Family History  Problem Relation Age of Onset   Lung cancer Father 64    Lung cancer Paternal Aunt        dx 4s   Lung cancer Paternal Uncle        dx 56s   Colon cancer Maternal Grandfather        dx 51s   Skin cancer Paternal Grandmother    Breast cancer Cousin        pat female cousin; dx 37s   Ovarian cancer Cousin        x2 pat cousins; dx 30s and dx 46s    Vicki Krueger is unaware of previous family history of genetic testing for hereditary cancer risks. There is no reported Ashkenazi Jewish ancestry. There is no known consanguinity.  GENETIC TEST RESULTS:  The [facility] Common Hereditary Cancers+RNA Panel found no pathogenic mutations.   The Common Hereditary Cancers Panel + RNA offered by [facility] includes sequencing and/or deletion duplication testing of the following 48 genes: APC*, ATM*, AXIN2, BAP1, BARD1, BMPR1A, BRCA1, BRCA2, BRIP1, CDH1, CDK4, CDKN2A (p14ARF), CDKN2A (p16INK4a), CHEK2, CTNNA1, DICER1*, EPCAM*, FH*, GREM1*, HOXB13, KIT, MBD4, MEN1*, MLH1*, MSH2*, MSH3*, MSH6*, MUTYH, NF1*, NTHL1, PALB2, PDGFRA, PMS2*, POLD1*, POLE, PTEN*, RAD51C, RAD51D, SDHA*, SDHB, SDHC*, SDHD, SMAD4, SMARCA4, STK11, TP53, TSC1*, TSC2, VHL.   The test report has been scanned into EPIC and is located under the Molecular Pathology section of the Results Review tab.  A portion of the result report is included below for reference. Genetic testing reported out on 10/30/2022.    Even though a pathogenic variant was not identified, possible explanations for the cancer in the family may include: There may be no hereditary risk for cancer in the family. The cancers in Vicki Krueger and/or her family may be sporadic/familial or  due to other genetic and environmental factors. There may be a gene mutation in one of these genes that current testing methods cannot detect but that chance is small. There could be another gene that has not yet been discovered, or that we have not yet tested, that is responsible for the cancer diagnoses in the family.  It is also possible there is a  hereditary cause for the cancer in the family that Vicki Krueger did not inherit.  Therefore, it is important to remain in touch with cancer genetics in the future so that we can continue to offer Vicki Krueger the most up to date genetic testing.   ADDITIONAL GENETIC TESTING:  We discussed with Vicki Krueger that her genetic testing was fairly extensive.  If there are additional relevant genes identified to increase cancer risk that can be analyzed in the future, we would be happy to discuss and coordinate this testing at that time.    CANCER SCREENING RECOMMENDATIONS:  Vicki Krueger test result is considered negative (normal).  This means that we have not identified a hereditary cause for her personal and family history of cancer at this time.   An individual's cancer risk and medical management are not determined by genetic test results alone. Overall cancer risk assessment incorporates additional factors, including personal medical history, family history, and any available genetic information that may result in a personalized plan for cancer prevention and surveillance. Therefore, it is recommended she continue to follow the cancer management and screening guidelines provided by her oncology and primary healthcare provider.  RECOMMENDATIONS FOR FAMILY MEMBERS:   Since she did not inherit a identifiable mutation in a cancer predisposition gene included on this panel, her children could not have inherited a known mutation from her in one of these genes. Individuals in this family might be at some increased risk of developing cancer, over the general population risk, due to the family history of cancer.  Individuals in the family should notify their providers of the family history of cancer. We recommend women in this family have a yearly mammogram beginning at age 72, or 60 years younger than the earliest onset of cancer, an annual clinical breast exam, and perform monthly breast self-exams.  Family members  should have colonoscopies by at age 52, or earlier, as recommended by their providers. Other members of the family may still carry a pathogenic variant in one of these genes that Vicki Krueger did not inherit. Based on the family history, we recommend her paternal relatives, especially those closely related to her cousins with ovarian cancer, have genetic counseling and testing. Ms. Kayes will let us know if we can be of any assistance in coordinating genetic counseling and/or testing for this family member.   FOLLOW-UP:  Lastly, we discussed with Vicki Krueger that cancer genetics is a rapidly advancing field and it is possible that new genetic tests will be appropriate for her and/or her family members in the future. We encouraged her to remain in contact with cancer genetics on an annual basis so we can update her personal and family histories and let her know of advances in cancer genetics that may benefit this family.   Our contact number was provided. Vicki Krueger questions were answered to her satisfaction, and she knows she is welcome to call us at anytime with additional questions or concerns.    Vicki Duverney, MS, [facility] Genetic Counselor [email or web address] Phone: [phone]

## 2022-11-03 NOTE — Telephone Encounter (Signed)
I contacted Ms. Bas to discuss her genetic testing results. No pathogenic variants were identified in the 48 genes analyzed. Detailed clinic note to follow.   The test report has been scanned into EPIC and is located under the Molecular Pathology section of the Results Review tab.  A portion of the result report is included below for reference.      Lacy Duverney, MS, [facility] Genetic Counselor [email or web address] Phone: [phone]

## 2022-11-09 ENCOUNTER — Encounter: Payer: Self-pay | Admitting: *Deleted

## 2022-11-09 DIAGNOSIS — C50912 Malignant neoplasm of unspecified site of left female breast: Secondary | ICD-10-CM | POA: Diagnosis not present

## 2022-11-10 ENCOUNTER — Ambulatory Visit
Admission: RE | Admit: 2022-11-10 | Discharge: 2022-11-10 | Disposition: A | Discharge status: Home / Self Care | Payer: Commercial Managed Care - PPO | Source: Ambulatory Visit | Attending: Surgery | Admitting: Surgery

## 2022-11-10 ENCOUNTER — Encounter (HOSPITAL_BASED_OUTPATIENT_CLINIC_OR_DEPARTMENT_OTHER)
Admission: RE | Admit: 2022-11-10 | Discharge: 2022-11-10 | Disposition: A | Discharge status: Home / Self Care | Payer: Commercial Managed Care - PPO | Source: Ambulatory Visit | Attending: Surgery | Admitting: Surgery

## 2022-11-10 DIAGNOSIS — D0512 Intraductal carcinoma in situ of left breast: Secondary | ICD-10-CM | POA: Diagnosis not present

## 2022-11-10 DIAGNOSIS — Z01810 Encounter for preprocedural cardiovascular examination: Secondary | ICD-10-CM | POA: Diagnosis not present

## 2022-11-10 DIAGNOSIS — Z01818 Encounter for other preprocedural examination: Secondary | ICD-10-CM | POA: Diagnosis present

## 2022-11-10 DIAGNOSIS — R001 Bradycardia, unspecified: Secondary | ICD-10-CM | POA: Insufficient documentation

## 2022-11-10 HISTORY — PX: BREAST BIOPSY: SHX20

## 2022-11-10 MED ORDER — CHLORHEXIDINE GLUCONATE CLOTH 2 % EX PADS: 6.0000 | MEDICATED_PAD | Freq: Once | CUTANEOUS | Status: DC

## 2022-11-10 MED ORDER — ENSURE PRE-SURGERY PO LIQD: 296.0000 mL | Freq: Once | ORAL | Status: DC

## 2022-11-10 NOTE — Progress Notes (Signed)

## 2022-11-11 NOTE — H&P (Signed)
REFERRING PHYSICIAN: Sabas Sous, MD PROVIDER: Wayne Both, MD MRN: [id number] DOB: [birth year]  Subjective   Chief Complaint:left breast DCIS  History of Present Illness: Vicki Krueger is a 72 y.o. 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 She was found on recent screen mammography to have a 3 mm area of calcifications in the upper outer left breast. She underwent a biopsy showing ductal carcinoma in situ. It was 100% ER positive and 80% PR positive. She has had no previous problems regarding her breast and there is no family history of breast cancer. She is otherwise healthy without complaints. She has had multiple surgical procedures without any issues with anesthesia.  Review of Systems: A complete review of systems was obtained from the patient. I have reviewed this information and discussed as appropriate with the patient. See HPI as well for other ROS.  Review of Systems  All other systems reviewed and are negative.   Medical History: Past Medical History:  Diagnosis Date  GERD (gastroesophageal reflux disease)  History of cancer  Hypertension   There is no problem list on file for this patient.  Past Surgical History:  Procedure Laterality Date  APPENDECTOMY  CESAREAN SECTION  CHOLECYSTECTOMY  JOINT REPLACEMENT Right  LAPAROSCOPIC TUBAL LIGATION  TONSILLECTOMY & ADENOIDECTOMY    Allergies  Allergen Reactions  Aspirin Other (See Comments)  Causes GI issues  GI UPSET  Pseudoephedrine Other (See Comments)  "HYPER"  Butorphanol Anxiety and Other (See Comments)  Hypotension  Doxycycline Nausea   Current Outpatient Medications on File Prior to Visit  Medication Sig Dispense Refill  atorvastatin (LIPITOR) 10 MG tablet  FUROsemide (LASIX) 20 MG tablet  metoprolol succinate (TOPROL-XL) 25 MG XL tablet  omeprazole  (PRILOSEC) 20 MG DR capsule Take by mouth  phentermine (ADIPEX-P) 15 MG capsule  spironolactone (ALDACTONE) 25 MG tablet  topiramate (TOPAMAX) 50 MG tablet   No current facility-administered medications on file prior to visit.   Family History  Problem Relation Age of Onset  High blood pressure (Hypertension) Mother  Hyperlipidemia (Elevated cholesterol) Mother  High blood pressure (Hypertension) Father  Coronary Artery Disease (Blocked arteries around heart) Father  Diabetes Father    Social History   Tobacco Use  Smoking Status Never  Smokeless Tobacco Never    Social History   Socioeconomic History  Marital status: Married  Tobacco Use  Smoking status: Never  Smokeless tobacco: Never  Vaping Use  Vaping status: Never Used  Substance and Sexual Activity  Alcohol use: Not Currently  Drug use: Never   Social Determinants of Health   Received from [employer]  Social Network   Objective:  There were no vitals filed for this visit.  There is no height or weight on file to calculate BMI.  Physical Exam   She appears well on exam.  There are no palpable breast masses. There is ecchymosis in the left breast from the biopsy. The nipple areolar complex is normal. There is no axillary adenopathy  Labs, Imaging and Diagnostic Testing: I have reviewed her mammograms, ultrasound, and pathology results  Assessment and Plan:   Diagnoses and all orders for this visit:  Neoplasm of left breast, primary tumor staging category Tis: ductal carcinoma in situ (DCIS)   We have discussed her multidisciplinary breast conference this morning and I have discussed the diagnosis with  the patient and her husband. 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 From her diagnosis, we discussed ductal carcinoma in situ of the left breast and surgical options. She is very interested in conservative management so we discussed  proceeding with a radioactive seed guided left breast lumpectomy. I explained the procedure in detail. We discussed the risks which includes but is not limited to bleeding, infection, injury to surrounding structures, the need for further surgery if margins are positive or invasive cancer is found, cardiopulmonary issues with anesthesia, postoperative recovery, etc. She understands and agrees to proceed with surgery which will be scheduled

## 2022-11-11 NOTE — Anesthesia Preprocedure Evaluation (Addendum)
Anesthesia Evaluation  Patient identified by MRN, date of birth, ID band Patient awake    Reviewed: Allergy & Precautions, NPO status , Patient's Chart, lab work & pertinent test results  Airway Mallampati: II  TM Distance: >3 FB Neck ROM: Full    Dental no notable dental hx. (+) Teeth Intact, Dental Advisory Given   Pulmonary neg pulmonary ROS   Pulmonary exam normal breath sounds clear to auscultation       Cardiovascular hypertension, Normal cardiovascular exam Rhythm:Regular Rate:Normal     Neuro/Psych negative neurological ROS  negative psych ROS   GI/Hepatic hiatal hernia,GERD  ,,(+) Hepatitis -  Endo/Other  negative endocrine ROS    Renal/GU Lab Results      Component                Value               Date                       K                        3.9                 10/21/2022              CREATININE               1.16 (H)            10/21/2022                     Musculoskeletal  (+) Arthritis ,    Abdominal   Peds  Hematology Lab Results      Component                Value               Date                      WBC                      6.8                 10/21/2022                HGB                      14.3                10/21/2022                HCT                      44.0                10/21/2022                MCV                      96.3                10/21/2022                PLT                      193  10/21/2022              Anesthesia Other Findings All: see list  Reproductive/Obstetrics                             Anesthesia Physical Anesthesia Plan  ASA: 3  Anesthesia Plan: General   Post-op Pain Management: Precedex, Tylenol PO (pre-op)* and Toradol IV (intra-op)*   Induction: Intravenous  PONV Risk Score and Plan: 4 or greater and Treatment may vary due to age or medical condition, Ondansetron, Dexamethasone, Propofol  infusion and TIVA  Airway Management Planned: LMA  Additional Equipment: None  Intra-op Plan:   Post-operative Plan: Extubation in OR  Informed Consent: I have reviewed the patients History and Physical, chart, labs and discussed the procedure including the risks, benefits and alternatives for the proposed anesthesia with the patient or authorized representative who has indicated his/her understanding and acceptance.     Dental advisory given  Plan Discussed with: CRNA  Anesthesia Plan Comments: (LMA TIVA  L breast CA)       Anesthesia Quick Evaluation

## 2022-11-12 ENCOUNTER — Ambulatory Visit (HOSPITAL_BASED_OUTPATIENT_CLINIC_OR_DEPARTMENT_OTHER)
Admission: RE | Admit: 2022-11-12 | Discharge: 2022-11-12 | Disposition: A | Discharge status: Home / Self Care | Payer: Commercial Managed Care - PPO | Attending: Surgery | Admitting: Surgery

## 2022-11-12 ENCOUNTER — Other Ambulatory Visit (HOSPITAL_COMMUNITY): Payer: Self-pay

## 2022-11-12 ENCOUNTER — Ambulatory Visit (HOSPITAL_BASED_OUTPATIENT_CLINIC_OR_DEPARTMENT_OTHER): Payer: Self-pay | Admitting: Anesthesiology

## 2022-11-12 ENCOUNTER — Ambulatory Visit (HOSPITAL_BASED_OUTPATIENT_CLINIC_OR_DEPARTMENT_OTHER): Payer: Commercial Managed Care - PPO | Admitting: Anesthesiology

## 2022-11-12 ENCOUNTER — Ambulatory Visit
Admission: RE | Admit: 2022-11-12 | Discharge: 2022-11-12 | Disposition: A | Discharge status: Home / Self Care | Payer: Commercial Managed Care - PPO | Source: Ambulatory Visit | Attending: Surgery | Admitting: Surgery

## 2022-11-12 ENCOUNTER — Encounter (HOSPITAL_BASED_OUTPATIENT_CLINIC_OR_DEPARTMENT_OTHER)
Admission: RE | Disposition: A | Discharge status: Home / Self Care | Payer: Self-pay | Source: Home / Self Care | Attending: Surgery

## 2022-11-12 ENCOUNTER — Encounter (HOSPITAL_BASED_OUTPATIENT_CLINIC_OR_DEPARTMENT_OTHER): Payer: Self-pay | Admitting: Surgery

## 2022-11-12 DIAGNOSIS — N6489 Other specified disorders of breast: Secondary | ICD-10-CM | POA: Diagnosis not present

## 2022-11-12 DIAGNOSIS — I10 Essential (primary) hypertension: Secondary | ICD-10-CM | POA: Diagnosis not present

## 2022-11-12 DIAGNOSIS — N6082 Other benign mammary dysplasias of left breast: Secondary | ICD-10-CM | POA: Insufficient documentation

## 2022-11-12 DIAGNOSIS — M1990 Unspecified osteoarthritis, unspecified site: Secondary | ICD-10-CM

## 2022-11-12 DIAGNOSIS — D0512 Intraductal carcinoma in situ of left breast: Secondary | ICD-10-CM

## 2022-11-12 HISTORY — PX: BREAST LUMPECTOMY WITH RADIOACTIVE SEED LOCALIZATION: SHX6424

## 2022-11-12 SURGERY — BREAST LUMPECTOMY WITH RADIOACTIVE SEED LOCALIZATION
Anesthesia: General | Site: Breast | Laterality: Left

## 2022-11-12 MED ORDER — FENTANYL CITRATE (PF) 100 MCG/2ML IJ SOLN
INTRAMUSCULAR | Status: AC
  Filled 2022-11-12: qty 2

## 2022-11-12 MED ORDER — 0.9 % SODIUM CHLORIDE (POUR BTL) OPTIME
TOPICAL | Status: DC | PRN
  Administered 2022-11-12: 1000 mL

## 2022-11-12 MED ORDER — ACETAMINOPHEN 500 MG PO TABS: 1000.0000 mg | ORAL_TABLET | ORAL | Status: AC

## 2022-11-12 MED ORDER — ONDANSETRON HCL 4 MG/2ML IJ SOLN
INTRAMUSCULAR | Status: DC | PRN
  Administered 2022-11-12: 4 mg via INTRAVENOUS

## 2022-11-12 MED ORDER — CEFAZOLIN SODIUM-DEXTROSE 2-4 GM/100ML-% IV SOLN
2.0000 g | INTRAVENOUS | Status: AC
  Administered 2022-11-12: 2 g via INTRAVENOUS

## 2022-11-12 MED ORDER — LIDOCAINE 2% (20 MG/ML) 5 ML SYRINGE
INTRAMUSCULAR | Status: AC
  Filled 2022-11-12: qty 5

## 2022-11-12 MED ORDER — PROPOFOL 10 MG/ML IV BOLUS
INTRAVENOUS | Status: DC | PRN
Start: 2022-11-12 — End: 2022-11-12
  Administered 2022-11-12: 150 mg via INTRAVENOUS

## 2022-11-12 MED ORDER — CEFAZOLIN SODIUM-DEXTROSE 2-4 GM/100ML-% IV SOLN
INTRAVENOUS | Status: AC
  Filled 2022-11-12: qty 100

## 2022-11-12 MED ORDER — ACETAMINOPHEN 500 MG PO TABS
ORAL_TABLET | ORAL | Status: AC
  Filled 2022-11-12: qty 2

## 2022-11-12 MED ORDER — PHENYLEPHRINE HCL (PRESSORS) 10 MG/ML IV SOLN
INTRAVENOUS | Status: DC | PRN
  Administered 2022-11-12 (×2): 80 ug via INTRAVENOUS

## 2022-11-12 MED ORDER — DEXAMETHASONE SODIUM PHOSPHATE 4 MG/ML IJ SOLN
INTRAMUSCULAR | Status: DC | PRN
  Administered 2022-11-12: 4 mg via INTRAVENOUS

## 2022-11-12 MED ORDER — LIDOCAINE 2% (20 MG/ML) 5 ML SYRINGE
INTRAMUSCULAR | Status: DC | PRN
  Administered 2022-11-12: 80 mg via INTRAVENOUS

## 2022-11-12 MED ORDER — EPHEDRINE SULFATE (PRESSORS) 50 MG/ML IJ SOLN
INTRAMUSCULAR | Status: DC | PRN
  Administered 2022-11-12 (×2): 10 mg via INTRAVENOUS

## 2022-11-12 MED ORDER — ONDANSETRON HCL 4 MG/2ML IJ SOLN
INTRAMUSCULAR | Status: AC
  Filled 2022-11-12: qty 2

## 2022-11-12 MED ORDER — ONDANSETRON HCL 4 MG/2ML IJ SOLN: 4.0000 mg | Freq: Once | INTRAMUSCULAR | Status: DC | PRN

## 2022-11-12 MED ORDER — LACTATED RINGERS IV SOLN: INTRAVENOUS | Status: DC

## 2022-11-12 MED ORDER — TRAMADOL HCL 50 MG PO TABS
50.0000 mg | ORAL_TABLET | Freq: Four times a day (QID) | ORAL | 0 refills | Status: DC | PRN
Start: 2022-11-12 — End: 2023-01-22
  Filled 2022-11-12: qty 25, 7d supply, fill #0

## 2022-11-12 MED ORDER — ACETAMINOPHEN 500 MG PO TABS
1000.0000 mg | ORAL_TABLET | Freq: Once | ORAL | Status: AC
  Administered 2022-11-12: 1000 mg via ORAL

## 2022-11-12 MED ORDER — DEXMEDETOMIDINE HCL IN NACL 80 MCG/20ML IV SOLN
INTRAVENOUS | Status: DC | PRN
  Administered 2022-11-12: 8 ug via INTRAVENOUS

## 2022-11-12 MED ORDER — FENTANYL CITRATE (PF) 100 MCG/2ML IJ SOLN: 25.0000 ug | INTRAMUSCULAR | Status: DC | PRN

## 2022-11-12 MED ORDER — PROPOFOL 500 MG/50ML IV EMUL
INTRAVENOUS | Status: DC | PRN
  Administered 2022-11-12: 150 ug/kg/min via INTRAVENOUS

## 2022-11-12 MED ORDER — BUPIVACAINE-EPINEPHRINE 0.5% -1:200000 IJ SOLN
INTRAMUSCULAR | Status: DC | PRN
  Administered 2022-11-12: 12 mL

## 2022-11-12 MED ORDER — FENTANYL CITRATE (PF) 100 MCG/2ML IJ SOLN
INTRAMUSCULAR | Status: DC | PRN
  Administered 2022-11-12: 50 ug via INTRAVENOUS

## 2022-11-12 MED ORDER — ACETAMINOPHEN 10 MG/ML IV SOLN: 1000.0000 mg | Freq: Once | INTRAVENOUS | Status: DC | PRN

## 2022-11-12 SURGICAL SUPPLY — 48 items
ADH SKN CLS APL DERMABOND .7 (GAUZE/BANDAGES/DRESSINGS) ×1
APL PRP STRL LF DISP 70% ISPRP (MISCELLANEOUS) ×1
APPLIER CLIP 9.375 MED OPEN (MISCELLANEOUS) ×1
APR CLP MED 9.3 20 MLT OPN (MISCELLANEOUS) ×1
BINDER BREAST 3XL (GAUZE/BANDAGES/DRESSINGS) IMPLANT
BINDER BREAST LRG (GAUZE/BANDAGES/DRESSINGS) IMPLANT
BINDER BREAST MEDIUM (GAUZE/BANDAGES/DRESSINGS) IMPLANT
BINDER BREAST XLRG (GAUZE/BANDAGES/DRESSINGS) IMPLANT
BINDER BREAST XXLRG (GAUZE/BANDAGES/DRESSINGS) IMPLANT
BLADE SURG 15 STRL LF DISP TIS (BLADE) ×1 IMPLANT
BLADE SURG 15 STRL SS (BLADE) ×1
CANISTER SUC SOCK COL 7IN (MISCELLANEOUS) IMPLANT
CANISTER SUCT 1200ML W/VALVE (MISCELLANEOUS) IMPLANT
CHLORAPREP W/TINT 26 (MISCELLANEOUS) ×1 IMPLANT
CLIP APPLIE 9.375 MED OPEN (MISCELLANEOUS) IMPLANT
COVER BACK TABLE 60X90IN (DRAPES) ×1 IMPLANT
COVER MAYO STAND STRL (DRAPES) ×1 IMPLANT
COVER PROBE CYLINDRICAL 5X96 (MISCELLANEOUS) ×1 IMPLANT
DERMABOND ADVANCED .7 DNX12 (GAUZE/BANDAGES/DRESSINGS) ×1 IMPLANT
DRAPE LAPAROSCOPIC ABDOMINAL (DRAPES) ×1 IMPLANT
DRAPE UTILITY XL STRL (DRAPES) ×1 IMPLANT
ELECT REM PT RETURN 9FT ADLT (ELECTROSURGICAL) ×1
ELECTRODE REM PT RTRN 9FT ADLT (ELECTROSURGICAL) ×1 IMPLANT
GAUZE SPONGE 4X4 12PLY STRL (GAUZE/BANDAGES/DRESSINGS) IMPLANT
GAUZE SPONGE 4X4 12PLY STRL LF (GAUZE/BANDAGES/DRESSINGS) IMPLANT
GLOVE SURG SIGNA 7.5 PF LTX (GLOVE) ×1 IMPLANT
GOWN STRL REUS W/ TWL LRG LVL3 (GOWN DISPOSABLE) ×1 IMPLANT
GOWN STRL REUS W/ TWL XL LVL3 (GOWN DISPOSABLE) ×1 IMPLANT
GOWN STRL REUS W/TWL LRG LVL3 (GOWN DISPOSABLE) ×1
GOWN STRL REUS W/TWL XL LVL3 (GOWN DISPOSABLE) ×1
KIT MARKER MARGIN INK (KITS) ×1 IMPLANT
NDL HYPO 25X1 1.5 SAFETY (NEEDLE) ×1 IMPLANT
NEEDLE HYPO 25X1 1.5 SAFETY (NEEDLE) ×1
NS IRRIG 1000ML POUR BTL (IV SOLUTION) IMPLANT
PACK BASIN DAY SURGERY FS (CUSTOM PROCEDURE TRAY) ×1 IMPLANT
PENCIL SMOKE EVACUATOR (MISCELLANEOUS) ×1 IMPLANT
SLEEVE SCD COMPRESS KNEE MED (STOCKING) ×1 IMPLANT
SPIKE FLUID TRANSFER (MISCELLANEOUS) IMPLANT
SPONGE T-LAP 4X18 ~~LOC~~+RFID (SPONGE) ×1 IMPLANT
SUT MNCRL AB 4-0 PS2 18 (SUTURE) ×1 IMPLANT
SUT SILK 2 0 SH (SUTURE) IMPLANT
SUT VIC AB 3-0 SH 27 (SUTURE) ×2
SUT VIC AB 3-0 SH 27X BRD (SUTURE) ×1 IMPLANT
SYR CONTROL 10ML LL (SYRINGE) ×1 IMPLANT
TOWEL GREEN STERILE FF (TOWEL DISPOSABLE) ×1 IMPLANT
TRAY FAXITRON CT DISP (TRAY / TRAY PROCEDURE) ×1 IMPLANT
TUBE CONNECTING 20X1/4 (TUBING) IMPLANT
YANKAUER SUCT BULB TIP NO VENT (SUCTIONS) IMPLANT

## 2022-11-12 NOTE — Interval H&P Note (Signed)
History and Physical Interval Note:no change in H and P  11/12/2022 7:11 AM  Vicki Krueger  has presented today for surgery, with the diagnosis of LEFT BREAST DUCTAL CARCINOMA IN SITU.  The various methods of treatment have been discussed with the patient and family. After consideration of risks, benefits and other options for treatment, the patient has consented to  Procedure(s): LEFT BREAST LUMPECTOMY WITH RADIOACTIVE SEED LOCALIZATION (Left) as a surgical intervention.  The patient's history has been reviewed, patient examined, no change in status, stable for surgery.  I have reviewed the patient's chart and labs.  Questions were answered to the patient's satisfaction.     Abigail Miyamoto

## 2022-11-12 NOTE — Op Note (Signed)
LEFT BREAST LUMPECTOMY WITH RADIOACTIVE SEED LOCALIZATION  Procedure Note  Vicki Krueger 11/12/2022   Pre-op Diagnosis: LEFT BREAST DUCTAL CARCINOMA IN SITU     Post-op Diagnosis: same  Procedure(s): LEFT BREAST LUMPECTOMY WITH RADIOACTIVE SEED LOCALIZATION  Surgeon(s): Abigail Miyamoto, MD  Anesthesia: General  Staff:  Circulator: Sofie Rower, RN Scrub Person: Donald Pore, CST; Edwards, Skylar K  Estimated Blood Loss: Minimal               Specimens: sent to path   Indications: This is a 72 year old female send to have 3 mm calcifications in the left breast on screening mammography.  She underwent a biopsy showing ductal carcinoma in situ.  The decision was made to proceed with a radioactive seed guided left breast lumpectomy  Findings: The radioactive seed and biopsy clip appeared close to the medial margins of the selected more medial margin.  This was sent separately.  The anterior margin of skin and the posterior margins of the chest wall  Procedure: The patient was brought to the operating room and identified as correct patient.  She was placed on the operating table and general anesthesia was induced.  The left breast was prepped and draped in sterile fashion.  I identified the radioactive seed in the lateral breast.  I anesthetized the skin with Marcaine with a scalpel.  I then performed skin flaps along the anterior and superior margins and then dissected down to the breast tissue with cautery.  With the aid of the neoprobe was then stayed widely around the radioactive seed going down to the chest completed the lumpectomy with electrocautery.  An x-ray was performed on the specimen from ophthalmology.  The seed and clip appeared close to the medial margin and more medial margin down to the chest wall.  This was sent separately to pathology.  I then achieved hemostasis with cautery.  I placed surgical clips around the periphery of the lumpectomy cavity.  I then  closed the deep portion of the tendon with 3-0 Vicryl suture.  I then closed the subcutaneous tissue with interrupted 3-0 Vicryl sutures and closed skin with running 4-0 nylon.  Dermabond was then applied.  Patient tolerated procedure well.  All the counts were correct at the end of the procedure.  The patient was placed in a breast binder and then extubated in the operating room and taken in stable condition to the recovery room.  Abigail Miyamoto   Date: 11/12/2022  Time: 9:30 AM

## 2022-11-12 NOTE — Anesthesia Procedure Notes (Signed)
Procedure Name: LMA Insertion Date/Time: 11/12/2022 8:50 AM  Performed by: Burna Cash, CRNAPre-anesthesia Checklist: Patient identified, Emergency Drugs available, Suction available and Patient being monitored Patient Re-evaluated:Patient Re-evaluated prior to induction Oxygen Delivery Method: Circle system utilized Preoxygenation: Pre-oxygenation with 100% oxygen Induction Type: IV induction Ventilation: Mask ventilation without difficulty LMA: LMA inserted LMA Size: 4.0 Number of attempts: 1 Airway Equipment and Method: Bite block Placement Confirmation: positive ETCO2 Tube secured with: Tape Dental Injury: Teeth and Oropharynx as per pre-operative assessment

## 2022-11-12 NOTE — Discharge Instructions (Addendum)
To whom it may concern:  Vicki Krueger is recovering from surgery performed on 11/12/2022.   She will need to be out of work for 3 weeks. She may then return to work to full activity without limits.  Sincerely,    Abigail Miyamoto, MD [facility] Office Phone Number [phone]  BREAST BIOPSY/ PARTIAL MASTECTOMY: POST OP INSTRUCTIONS  Always review your discharge instruction sheet given to you by the facility where your surgery was performed.  IF YOU HAVE DISABILITY OR FAMILY LEAVE FORMS, YOU MUST BRING THEM TO THE OFFICE FOR PROCESSING.  DO NOT GIVE THEM TO YOUR DOCTOR.  A prescription for pain medication may be given to you upon discharge.  Take your pain medication as prescribed, if needed.  If narcotic pain medicine is not needed, then you may take acetaminophen (Tylenol) or ibuprofen (Advil) as needed. Take your usually prescribed medications unless otherwise directed If you need a refill on your pain medication, please contact your pharmacy.  They will contact our office to request authorization.  Prescriptions will not be filled after 5pm or on week-ends. You should eat very light the first 24 hours after surgery, such as soup, crackers, pudding, etc.  Resume your normal diet the day after surgery. Most patients will experience some swelling and bruising in the breast.  Ice packs and a good support bra will help.  Swelling and bruising can take several days to resolve.  It is common to experience some constipation if taking pain medication after surgery.  Increasing fluid intake and taking a stool softener will usually help or prevent this problem from occurring.  A mild laxative (Milk of Magnesia or Miralax) should be taken according to package directions if there are no bowel movements after 48 hours. Unless discharge instructions indicate otherwise, you may remove your bandages 24-48 hours after surgery,  and you may shower at that time.  You may have steri-strips (small skin tapes) in place directly over the incision.  These strips should be left on the skin for 7-10 days.  If your surgeon used skin glue on the incision, you may shower in 24 hours.  The glue will flake off over the next 2-3 weeks.  Any sutures or staples will be removed at the office during your follow-up visit. ACTIVITIES:  You may resume regular daily activities (gradually increasing) beginning the next day.  Wearing a good support bra or sports bra minimizes pain and swelling.  You may have sexual intercourse when it is comfortable. You may drive when you no longer are taking prescription pain medication, you can comfortably wear a seatbelt, and you can safely maneuver your car and apply brakes. RETURN TO WORK:  YOU MAY RETURN TO WORK IN 3 WEEKS ______________________________________________________________________________________ You should see your doctor in the office for a follow-up appointment approximately two weeks after your surgery.  Your doctor's nurse will typically make your follow-up appointment when she calls you with your pathology report.  Expect your pathology report 2-3 business days after your surgery.  You may call to check if you do not hear from [place] after three days. OTHER INSTRUCTIONS: YOU MAY REMOVE THE BINDER AND SHOWER STARTING TOMORROW AND THEN PUT THE BINDER BACK ON ICE PACK, TYLENOL, AND IBUPROFEN ALSO FOR PAIN. No Tylenol until after 1:15pm today, if needed. NO VIGOROUS ACTIVITY FOR ONE WEEK _______________________________________________________________________________________________  _____________________________________________________________________________________________________________________________________ _____________________________________________________________________________________________________________________________________ _____________________________________________________________________________________________________________________________________  WHEN TO CALL YOUR DOCTOR: Fever over 101.0 Nausea and/or vomiting. Extreme swelling or bruising. Continued bleeding from incision. Increased pain, redness, or drainage from the incision.  The clinic staff is available to answer your questions during regular business hours.  Please don't hesitate to call and ask to speak to one of the nurses for clinical concerns.  If you have a medical emergency, go to the nearest emergency room or call 911.  A surgeon from [facility] is always on call at the hospital.  For further questions, please visit [email or web address]    Post Anesthesia Home Care Instructions  Activity: Get plenty of rest for the remainder of the day. A responsible individual must stay with you for 24 hours following the procedure.  For the next 24 hours, DO NOT: -Drive a car -Advertising copywriter -Drink alcoholic beverages -Take any medication unless instructed by your physician -Make any legal decisions or sign important papers.  Meals: Start with liquid foods such as gelatin or soup. Progress to regular foods as tolerated. Avoid greasy, spicy, heavy foods. If nausea and/or vomiting occur, drink only clear liquids until the nausea and/or vomiting subsides. Call your physician if vomiting continues.  Special Instructions/Symptoms: Your throat may feel dry or sore from the anesthesia or the breathing tube placed in your throat during surgery. If this causes discomfort, gargle with warm salt water. The discomfort should disappear within 24 hours.  If you had a  scopolamine patch placed behind your ear for the management of post- operative nausea and/or vomiting:  1. The medication in the patch is effective for 72 hours, after which it should be removed.  Wrap patch in a tissue and discard in the trash. Wash hands thoroughly with soap and water. 2. You may remove the patch earlier than 72 hours if you experience unpleasant side effects which may include dry mouth, dizziness or visual disturbances. 3. Avoid touching the patch. Wash your hands with soap and water after contact with the patch.

## 2022-11-12 NOTE — Transfer of Care (Signed)
Immediate Anesthesia Transfer of Care Note  Patient: Levonne Hubert  Procedure(s) Performed: LEFT BREAST LUMPECTOMY WITH RADIOACTIVE SEED LOCALIZATION (Left: Breast)  Patient Location: PACU  Anesthesia Type:General  Level of Consciousness: sedated  Airway & Oxygen Therapy: Patient Spontanous Breathing and Patient connected to face mask oxygen  Post-op Assessment: Report given to RN and Post -op Vital signs reviewed and stable  Post vital signs: Reviewed and stable  Last Vitals:  Vitals Value Taken Time  BP    Temp    Pulse 70 11/12/22 0938  Resp    SpO2 100 % 11/12/22 0938  Vitals shown include unfiled device data.  Last Pain:  Vitals:   11/12/22 0713  TempSrc: Temporal  PainSc: 1       Patients Stated Pain Goal: 5 (11/12/22 0713)  Complications: No notable events documented.

## 2022-11-12 NOTE — Anesthesia Postprocedure Evaluation (Signed)
Anesthesia Post Note  Patient: Vicki Krueger  Procedure(s) Performed: LEFT BREAST LUMPECTOMY WITH RADIOACTIVE SEED LOCALIZATION (Left: Breast)     Patient location during evaluation: PACU Anesthesia Type: General Level of consciousness: awake and alert Pain management: pain level controlled Vital Signs Assessment: post-procedure vital signs reviewed and stable Respiratory status: spontaneous breathing, nonlabored ventilation, respiratory function stable and patient connected to nasal cannula oxygen Cardiovascular status: blood pressure returned to baseline and stable Postop Assessment: no apparent nausea or vomiting Anesthetic complications: no   No notable events documented.  Last Vitals:  Vitals:   11/12/22 1000 11/12/22 1012  BP: 105/62 101/66  Pulse: 64 68  Resp: 16 18  Temp:  36.4 C  SpO2: 99% 98%    Last Pain:  Vitals:   11/12/22 1012  TempSrc:   PainSc: 0-No pain                 Trevor Iha

## 2022-11-13 ENCOUNTER — Other Ambulatory Visit: Payer: Self-pay

## 2022-11-13 ENCOUNTER — Encounter (HOSPITAL_BASED_OUTPATIENT_CLINIC_OR_DEPARTMENT_OTHER): Payer: Self-pay | Admitting: Surgery

## 2022-11-18 ENCOUNTER — Other Ambulatory Visit (HOSPITAL_COMMUNITY): Payer: Self-pay

## 2022-11-18 MED ORDER — NAPROXEN 500 MG PO TABS
500.0000 mg | ORAL_TABLET | Freq: Two times a day (BID) | ORAL | 0 refills | Status: DC
  Filled 2022-11-18: qty 16, 8d supply, fill #0

## 2022-11-18 MED ORDER — AMOXICILLIN 875 MG PO TABS
875.0000 mg | ORAL_TABLET | Freq: Two times a day (BID) | ORAL | 0 refills | Status: DC
  Filled 2022-11-18: qty 10, 5d supply, fill #0

## 2022-11-19 ENCOUNTER — Encounter: Payer: Self-pay | Admitting: *Deleted

## 2022-11-24 ENCOUNTER — Other Ambulatory Visit (HOSPITAL_COMMUNITY): Payer: Self-pay

## 2022-11-24 DIAGNOSIS — Z01419 Encounter for gynecological examination (general) (routine) without abnormal findings: Secondary | ICD-10-CM | POA: Diagnosis not present

## 2022-11-24 DIAGNOSIS — Z124 Encounter for screening for malignant neoplasm of cervix: Secondary | ICD-10-CM | POA: Diagnosis not present

## 2022-11-24 DIAGNOSIS — Z79890 Hormone replacement therapy (postmenopausal): Secondary | ICD-10-CM | POA: Diagnosis not present

## 2022-11-24 MED ORDER — INTRAROSA 6.5 MG VA INST
6.5000 mg | VAGINAL_INSERT | Freq: Every day | VAGINAL | 1 refills | Status: DC
Start: 2022-11-24 — End: 2023-01-22
  Filled 2022-11-24: qty 84, 84d supply, fill #0

## 2022-11-27 ENCOUNTER — Other Ambulatory Visit (HOSPITAL_COMMUNITY): Payer: Self-pay

## 2022-11-30 NOTE — Progress Notes (Signed)
Location of Breast Cancer: dcis left breast  Histology per Pathology Report:    11-12-22 FINAL MICROSCOPIC DIAGNOSIS:   A. BREAST, LEFT, LUMPECTOMY:  -  Benign breast tissue with fibrocystic change, columnar cell change  and the presence of biopsy site changes.  -  No residual ductal carcinoma in situ (DCIS) is identified (lesion  submitted in entirety).   B. BREAST, LEFT NEW MEDIAL MARGIN, EXCISION:  -  Benign breast tissue with fibrocystic change, apocrine metaplasia and  incidental radial scar (completely excised).  -  Negative for DCIS.   Receptor Status: ER(100%), PR (80%), Her2-neu (), Ki-67()  Did patient present with symptoms (if so, please note symptoms) or was this found on screening mammography?: screening mammogram  Past/Anticipated interventions by surgeon, if any: 11-12-22  Pre-op Diagnosis: LEFT BREAST DUCTAL CARCINOMA IN SITU     Post-op Diagnosis: same   Procedure(s): LEFT BREAST LUMPECTOMY WITH RADIOACTIVE SEED LOCALIZATION   Surgeon(s): Abigail Miyamoto, MD  Past/Anticipated interventions by medical oncology, if any:  Serena Croissant, MD 10/21/2022  Oncology History  Ductal carcinoma in situ (DCIS) of left breast  10/09/2022 Initial Diagnosis    Screening mammogram detected left breast calcifications measuring 0.3 cm, stereotactic biopsy with intermediate grade DCIS with necrosis and calcifications ER 100%, PR 80%    10/21/2022 Cancer Staging    Staging form: Breast, AJCC 8th Edition - Clinical: Stage 0 (cTis (DCIS), cN0, cM0, G2, ER+, PR+, HER2: Not Assessed) - Signed by Serena Croissant, MD on 10/21/2022 Stage prefix: Initial diagnosis Histologic grading system: 3 grade system  ASSESSMENT AND PLAN:  Ductal carcinoma in situ (DCIS) of left breast 10/09/2022:Screening mammogram detected left breast calcifications measuring 0.3 cm, stereotactic biopsy with intermediate grade DCIS with necrosis and calcifications ER 100%, PR 80%   Pathology review: I discussed  with the patient the difference between DCIS and invasive breast cancer. It is considered a precancerous lesion. DCIS is classified as a 0. It is generally detected through mammograms as calcifications. We discussed the significance of grades and its impact on prognosis. We also discussed the importance of ER and PR receptors and their implications to adjuvant treatment options. Prognosis of DCIS dependence on grade, comedo necrosis. It is anticipated that if not treated, 20-30% of DCIS can develop into invasive breast cancer.   Recommendation: 1. Breast conserving surgery 2. Followed by adjuvant radiation therapy 3. Followed by antiestrogen therapy with tamoxifen 5 years   Tamoxifen counseling: We discussed the risks and benefits of tamoxifen. These include but not limited to insomnia, hot flashes, mood changes, vaginal dryness, and weight gain. Although rare, serious side effects including endometrial cancer, risk of blood clots were also discussed. We strongly believe that the benefits far outweigh the risks. Patient understands these risks and consented to starting treatment. Planned treatment duration is 5 years.   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 Return to clinic after surgery to discuss the final pathology report and come up with an adjuvant treatment plan.  Lymphedema issues, if any:  not applicable  Pain issues, if any:  Sensitive to lumpectomy incision site.   SAFETY ISSUES: Prior radiation? no Pacemaker/ICD? no Possible current pregnancy?no Is the patient on methotrexate? no  Current Complaints / other details:     BP 133/75 (BP Location: Left Arm, Patient Position: Sitting)   Pulse 73   Temp (!) 97.1 F (36.2 C) (Temporal)   Resp 18   Ht  4\' 11"  (1.499 m)   Wt 193 lb 4 oz (87.7 kg)   SpO2 96%   BMI 39.03 kg/m

## 2022-12-02 ENCOUNTER — Other Ambulatory Visit (HOSPITAL_COMMUNITY): Payer: Self-pay

## 2022-12-02 MED ORDER — ATORVASTATIN CALCIUM 10 MG PO TABS
10.0000 mg | ORAL_TABLET | Freq: Every day | ORAL | 3 refills | Status: DC
  Filled 2022-12-02: qty 90, 90d supply, fill #0
  Filled 2023-03-05: qty 90, 90d supply, fill #1
  Filled 2023-06-11: qty 90, 90d supply, fill #2
  Filled 2023-09-16: qty 90, 90d supply, fill #3

## 2022-12-02 MED ORDER — METOPROLOL SUCCINATE ER 25 MG PO TB24
25.0000 mg | ORAL_TABLET | Freq: Every day | ORAL | 3 refills | Status: DC
  Filled 2022-12-02: qty 90, 90d supply, fill #0
  Filled 2023-03-05: qty 90, 90d supply, fill #1
  Filled 2023-06-11: qty 90, 90d supply, fill #2
  Filled 2023-09-10: qty 90, 90d supply, fill #3

## 2022-12-04 ENCOUNTER — Other Ambulatory Visit: Payer: Self-pay

## 2022-12-04 ENCOUNTER — Other Ambulatory Visit (HOSPITAL_COMMUNITY): Payer: Self-pay

## 2022-12-04 ENCOUNTER — Inpatient Hospital Stay: Payer: Commercial Managed Care - PPO | Attending: Hematology and Oncology | Admitting: Hematology and Oncology

## 2022-12-04 VITALS — BP 132/70 | HR 85 | Temp 97.8°F | Resp 18 | Ht 59.0 in | Wt 190.6 lb

## 2022-12-04 DIAGNOSIS — D0512 Intraductal carcinoma in situ of left breast: Secondary | ICD-10-CM | POA: Insufficient documentation

## 2022-12-04 NOTE — Progress Notes (Signed)
Patient Care Team: Thana Ates, MD as PCP - General (Internal Medicine) Serena Croissant, MD as Consulting Physician (Hematology and Oncology) Lonie Peak, MD as Attending Physician (Radiation Oncology) Abigail Miyamoto, MD as Consulting Physician (General Surgery) Donnelly Angelica, RN as Oncology Nurse Navigator Pershing Proud, RN as Oncology Nurse Navigator  DIAGNOSIS:  Encounter Diagnosis  Name Primary?   Ductal carcinoma in situ (DCIS) of left breast Yes    SUMMARY OF ONCOLOGIC HISTORY: Oncology History  Ductal carcinoma in situ (DCIS) of left breast  10/09/2022 Initial Diagnosis   Screening mammogram detected left breast calcifications measuring 0.3 cm, stereotactic biopsy with intermediate grade DCIS with necrosis and calcifications ER 100%, PR 80%   10/21/2022 Cancer Staging   Staging form: Breast, AJCC 8th Edition - Clinical: Stage 0 (cTis (DCIS), cN0, cM0, G2, ER+, PR+, HER2: Not Assessed) - Signed by Serena Croissant, MD on 10/21/2022 Stage prefix: Initial diagnosis Histologic grading system: 3 grade system   10/27/2022 Genetic Testing   Negative [facility] Common Hereditary Cancers +RNA Panel.  Report date is 10/27/2022.    The [facility] Common Hereditary Cancers + RNA Panel includes sequencing, deletion/duplication, and RNA analysis of the following 48 genes: APC, ATM, AXIN2, BAP1, BARD1, BMPR1A, BRCA1, BRCA2, BRIP1, CDH1, CDK4*, CDKN2A*, CHEK2, CTNNA1, DICER1, EPCAM* (del/dup only), FH, GREM1* (promoter dup analysis only), HOXB13*, KIT*, MBD4*, MEN1, MLH1, MSH2, MSH3, MSH6, MUTYH, NF1, NTHL1, PALB2, PDGFRA*, PMS2, POLD1, POLE, PTEN, RAD51C, RAD51D, SDHA (sequencing only), SDHB, SDHC, SDHD, SMAD4, SMARCA4, STK11, TP53, TSC1, TSC2, VHL.  *Genes without RNA analysis.    11/12/2022 Surgery   Left lumpectomy: No residual DCIS identified.     CHIEF COMPLIANT: Follow-up after surgery  INTERVAL HISTORY: Vicki Krueger is a 72 y.o. female is here because of recent diagnosis of left  breast cancer. Patient says surgery went well. Denies any pain but some mild soreness.  She is expecting to go back to work next week.  She has appointments with radiation.  ALLERGIES:  is allergic to ergotamine-caffeine, aspirin, other, pseudoephedrine, stadol [butorphanol], doxycycline, and morphine and codeine.  MEDICATIONS:  Current Outpatient Medications  Medication Sig Dispense Refill   atorvastatin (LIPITOR) 10 MG tablet Take 1 tablet (10 mg total) by mouth daily. 90 tablet 3   cholecalciferol (VITAMIN D) 1000 UNITS tablet Take 1,000 Units by mouth daily.     cyclobenzaprine (FLEXERIL) 10 MG tablet Take 10 mg by mouth at bedtime as needed for muscle spasms.     furosemide (LASIX) 20 MG tablet Take 1 tablet (20 mg total) by mouth daily for 5 days a week 70 tablet 3   metoprolol succinate (TOPROL-XL) 25 MG 24 hr tablet Take 1 tablet (25 mg total) by mouth daily. 90 tablet 3   Multiple Vitamins-Minerals (CENTRUM SILVER ADULT 50+) TABS Take 1 tablet by mouth daily.     omeprazole (PRILOSEC) 20 MG capsule Take 1 capsule (20 mg total) by mouth. 1 to 2 times per day 90 capsule 4   Prasterone (INTRAROSA) 6.5 MG INST Place 6.5 mg vaginally daily. 84 each 1   spironolactone (ALDACTONE) 25 MG tablet Take 2 tablets (50 mg total) by mouth daily once daily 180 tablet 3   topiramate (TOPAMAX) 50 MG tablet Take 1 tablet (50 mg total) by mouth 2 (two) times daily. 180 tablet 3   traMADol (ULTRAM) 50 MG tablet Take 1 tablet (50 mg total) by mouth every 6 (six) hours as needed for moderate pain or severe pain. 25 tablet 0  No current facility-administered medications for this visit.   Facility-Administered Medications Ordered in Other Visits  Medication Dose Route Frequency Provider Last Rate Last Admin   sodium chloride irrigation 0.9 %    PRN Eugenia Mcalpine, MD   6,000 mL at 06/12/11 0705    PHYSICAL EXAMINATION: ECOG PERFORMANCE STATUS: 1 - Symptomatic but completely ambulatory  Vitals:    12/04/22 0912  BP: 132/70  Pulse: 85  Resp: 18  Temp: 97.8 F (36.6 C)  SpO2: 95%   Filed Weights   12/04/22 0912  Weight: 190 lb 9.6 oz (86.5 kg)      LABORATORY DATA:  I have reviewed the data as listed    Latest Ref Rng & Units 10/21/2022    1:05 PM 04/09/2021    1:57 PM 03/07/2018   10:13 PM  CMP  Glucose 70 - 99 mg/dL 98  086  578   BUN 8 - 23 mg/dL 15  16  17    Creatinine 0.44 - 1.00 mg/dL 4.69  6.29  5.28   Sodium 135 - 145 mmol/L 143  144  141   Potassium 3.5 - 5.1 mmol/L 3.9  3.7  3.5   Chloride 98 - 111 mmol/L 108  110  110   CO2 22 - 32 mmol/L 27  26    Calcium 8.9 - 10.3 mg/dL 9.9  9.2    Total Protein 6.5 - 8.1 g/dL 6.7     Total Bilirubin 0.3 - 1.2 mg/dL 0.3     Alkaline Phos 38 - 126 U/L 99     AST 15 - 41 U/L 22     ALT 0 - 44 U/L 21       Lab Results  Component Value Date   WBC 6.8 10/21/2022   HGB 14.3 10/21/2022   HCT 44.0 10/21/2022   MCV 96.3 10/21/2022   PLT 193 10/21/2022   NEUTROABS 3.8 10/21/2022    ASSESSMENT & PLAN:  Ductal carcinoma in situ (DCIS) of left breast 10/09/2022:Screening mammogram detected left breast calcifications measuring 0.3 cm, stereotactic biopsy with intermediate grade DCIS with necrosis and calcifications ER 100%, PR 80%  11/12/2022: No residual DCIS identified in the surgery specimen.  Pathology counseling: I discussed the final pathology report of the patient provided  a copy of this report. I discussed the margins.  We also discussed the final staging along with previously performed ER/PR testing.  The initial biopsy must have removed all the evidence of DCIS.  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 Return to clinic after radiation is complete    No orders of the defined types were placed in this encounter.  The patient has a good understanding of the  overall plan. she agrees with it. she will call with any problems that may develop before the next visit here. Total time spent: 30 mins including face to face time and time spent for planning, charting and co-ordination of care   Tamsen Meek, MD 12/04/22    I Janan Ridge am acting as a Neurosurgeon for [employer]  I have reviewed the above documentation for accuracy and completeness, and I agree with the above.

## 2022-12-04 NOTE — Assessment & Plan Note (Signed)
10/09/2022:Screening mammogram detected left breast calcifications measuring 0.3 cm, stereotactic biopsy with intermediate grade DCIS with necrosis and calcifications ER 100%, PR 80%  11/12/2022: No residual DCIS identified in the surgery specimen.  Pathology counseling: I discussed the final pathology report of the patient provided  a copy of this report. I discussed the margins.  We also discussed the final staging along with previously performed ER/PR testing.  The initial biopsy must have removed all the evidence of DCIS.  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 Return to clinic after radiation is complete

## 2022-12-07 ENCOUNTER — Telehealth: Payer: Self-pay

## 2022-12-07 NOTE — Progress Notes (Signed)
Radiation Oncology         [phone] ________________________________  Name: MODENA STEEDLEY MRN: [id number]  Date: 12/08/2022  DOB: [birth year]  Follow-Up Visit Note  Outpatient  CC: Thana Ates, MD  Serena Croissant, MD  Diagnosis:   No diagnosis found.   No evidence of residual carcinoma s/p lumpectomy   Stage 0 (cTis (DCIS), cN0, cM0) Left Breast, Intermediate grade ductal carcinoma in situ, ER+ / PR+ / Her2 not assessed   CHIEF COMPLAINT: Here to discuss management of left breast cancer  Narrative:  The patient returns today for follow-up.     On her breast clinic consultation date of 10/21/22, she opted to pursue genetic testing. Results showed no clinically significant variants detected by [facility] genetic testing.   She opted to proceed with a left breast lumpectomy without nodal biopsies on 11/12/22 under the care of Dr. Magnus Ivan. Pathology from the procedure revealed: no evidence of residual DCIS.   Based on a detailed discussion with Dr. Pamelia Hoit, she has opted to proceed with antiestrogen therapy consisting of tamoxifen x 5 years. She will return to Dr. Pamelia Hoit following XRT to initiate antiestrogen therapy.   Symptomatically, the patient reports: ***        ALLERGIES:  is allergic to ergotamine-caffeine, aspirin, other, pseudoephedrine, stadol [butorphanol], doxycycline, and morphine and codeine.  Meds: Current Outpatient Medications  Medication Sig Dispense Refill   atorvastatin (LIPITOR) 10 MG tablet Take 1 tablet (10 mg total) by mouth daily. 90 tablet 3   cholecalciferol (VITAMIN D) 1000 UNITS tablet Take 1,000 Units by mouth daily.     cyclobenzaprine (FLEXERIL) 10 MG tablet Take 10 mg by mouth at bedtime as needed for muscle spasms.     furosemide (LASIX) 20 MG tablet Take 1 tablet (20 mg total) by mouth daily for 5 days a week 70 tablet 3   metoprolol succinate (TOPROL-XL) 25 MG 24 hr tablet Take 1 tablet (25 mg total) by mouth daily. 90 tablet 3   Multiple  Vitamins-Minerals (CENTRUM SILVER ADULT 50+) TABS Take 1 tablet by mouth daily.     omeprazole (PRILOSEC) 20 MG capsule Take 1 capsule (20 mg total) by mouth. 1 to 2 times per day 90 capsule 4   Prasterone (INTRAROSA) 6.5 MG INST Place 6.5 mg vaginally daily. 84 each 1   spironolactone (ALDACTONE) 25 MG tablet Take 2 tablets (50 mg total) by mouth daily once daily 180 tablet 3   topiramate (TOPAMAX) 50 MG tablet Take 1 tablet (50 mg total) by mouth 2 (two) times daily. 180 tablet 3   traMADol (ULTRAM) 50 MG tablet Take 1 tablet (50 mg total) by mouth every 6 (six) hours as needed for moderate pain or severe pain. 25 tablet 0   No current facility-administered medications for this encounter.   Facility-Administered Medications Ordered in Other Encounters  Medication Dose Route Frequency Provider Last Rate Last Admin   sodium chloride irrigation 0.9 %    PRN Eugenia Mcalpine, MD   6,000 mL at 06/12/11 0705    Physical Findings:  vitals were not taken for this visit. .     General: Alert and oriented, in no acute distress HEENT: Head is normocephalic. Extraocular movements are intact. Oropharynx is clear. Neck: Neck is supple, no palpable cervical or supraclavicular lymphadenopathy. Heart: Regular in rate and rhythm with no murmurs, rubs, or gallops. Chest: Clear to auscultation bilaterally, with no rhonchi, wheezes, or rales. Abdomen: Soft, nontender, nondistended, with no rigidity or guarding. Extremities: No  cyanosis or edema. Lymphatics: see Neck Exam Musculoskeletal: symmetric strength and muscle tone throughout. Neurologic: No obvious focalities. Speech is fluent.  Psychiatric: Judgment and insight are intact. Affect is appropriate. Breast exam reveals ***  Lab Findings: Lab Results  Component Value Date   WBC 6.8 10/21/2022   HGB 14.3 10/21/2022   HCT 44.0 10/21/2022   MCV 96.3 10/21/2022   PLT 193 10/21/2022    @LASTCHEMISTRY @  Radiographic Findings: MM Breast Surgical  Specimen  Result Date: 11/12/2022 CLINICAL DATA:  Evaluate surgical specimen following lumpectomy for LEFT breast DCIS EXAM: SPECIMEN RADIOGRAPH OF THE LEFT BREAST COMPARISON:  Previous exam(s). FINDINGS: Status post excision of the LEFT breast. The radioactive seed, RIBBON biopsy clip and X biopsy clip are present and intact within the specimen. IMPRESSION: Specimen radiograph of the LEFT breast. Electronically Signed   By: Harmon Pier M.D.   On: 11/12/2022 09:15   MM LT RADIOACTIVE SEED LOC MAMMO GUIDE  Result Date: 11/10/2022 CLINICAL DATA:  72 year old female presents for radioactive seed localization of LEFT breast DCIS. EXAM: MAMMOGRAPHIC GUIDED RADIOACTIVE SEED LOCALIZATION OF THE LEFT BREAST COMPARISON:  Previous exam(s). FINDINGS: Patient presents for radioactive seed localization prior to . I met with the patient and we discussed the procedure of seed localization including benefits and alternatives. We discussed the high likelihood of a successful procedure. We discussed the risks of the procedure including infection, bleeding, tissue injury and further surgery. We discussed the low dose of radioactivity involved in the procedure. Informed, written consent was given. The usual time-out protocol was performed immediately prior to the procedure. Using mammographic guidance, sterile technique, 1% lidocaine and an I-125 radioactive seed, the RIBBON biopsy clip was localized using a LATERAL approach. The follow-up mammogram images confirm the seed in the expected location and were marked for Dr. Magnus Ivan. Follow-up survey of the patient confirms presence of the radioactive seed. Order number of I-125 seed:  [id number]. Total activity:  0.253 millicuries.  Reference Date: 10/02/2022 The patient tolerated the procedure well and was released from the [facility]. She was given instructions regarding seed removal. IMPRESSION: Radioactive seed localization LEFT breast. No apparent complications.  Electronically Signed   By: Harmon Pier M.D.   On: 11/10/2022 10:17    Impression/Plan: We discussed adjuvant radiotherapy today.  I recommend *** in order to ***.  I reviewed the logistics, benefits, risks, and potential side effects of this treatment in detail. Risks may include but not necessary be limited to acute and late injury tissue in the radiation fields such as skin irritation (change in color/pigmentation, itching, dryness, pain, peeling). She may experience fatigue. We also discussed possible risk of long term cosmetic changes or scar tissue. There is also a smaller risk for lung toxicity, ***cardiac toxicity, ***brachial plexopathy, ***lymphedema, ***musculoskeletal changes, ***rib fragility or ***induction of a second malignancy, ***late chronic non-healing soft tissue wound.    The patient asked good questions which I answered to her satisfaction. She is enthusiastic about proceeding with treatment. A consent form has been *** signed and placed in her chart.  A total of *** medically necessary complex treatment devices will be fabricated and supervised by me: *** fields with MLCs for custom blocks to protect heart, and lungs;  and, a Vac-lok. MORE COMPLEX DEVICES MAY BE MADE IN DOSIMETRY FOR FIELD IN FIELD BEAMS FOR DOSE HOMOGENEITY.  I have requested : 3D Simulation which is medically necessary to give adequate dose to at risk tissues while sparing lungs and heart.  I  have requested a DVH of the following structures: lungs, heart, *** lumpectomy cavity.    The patient will receive *** Gy in *** fractions to the *** with *** fields.  This will be *** followed by a boost.  On date of service, in total, I spent *** minutes on this encounter. Patient was seen in person.  _____________________________________   Lonie Peak, MD  This document serves as a record of services personally performed by Lonie Peak, MD. It was created on her behalf by Neena Rhymes, a trained medical scribe.  The creation of this record is based on the scribe's personal observations and the provider's statements to them. This document has been checked and approved by the attending provider.

## 2022-12-07 NOTE — Telephone Encounter (Signed)
RN attempted to call pt to obtain pre consult information but pt was working today according to family. Pt will not get off work until [employer]. Nursing staff will have to obtain information in clinic.

## 2022-12-08 ENCOUNTER — Other Ambulatory Visit: Payer: Self-pay

## 2022-12-08 ENCOUNTER — Ambulatory Visit
Admission: RE | Admit: 2022-12-08 | Payer: Commercial Managed Care - PPO | Source: Ambulatory Visit | Admitting: Radiation Oncology

## 2022-12-08 ENCOUNTER — Ambulatory Visit
Admission: RE | Admit: 2022-12-08 | Discharge: 2022-12-08 | Disposition: A | Discharge status: Home / Self Care | Payer: Commercial Managed Care - PPO | Source: Ambulatory Visit | Attending: Radiation Oncology | Admitting: Radiation Oncology

## 2022-12-08 ENCOUNTER — Encounter: Payer: Self-pay | Admitting: Radiation Oncology

## 2022-12-08 VITALS — BP 133/75 | HR 73 | Temp 97.1°F | Resp 18 | Ht 59.0 in | Wt 193.2 lb

## 2022-12-08 DIAGNOSIS — Z79899 Other long term (current) drug therapy: Secondary | ICD-10-CM | POA: Insufficient documentation

## 2022-12-08 DIAGNOSIS — Z170 Estrogen receptor positive status [ER+]: Secondary | ICD-10-CM | POA: Insufficient documentation

## 2022-12-08 DIAGNOSIS — D0512 Intraductal carcinoma in situ of left breast: Secondary | ICD-10-CM | POA: Insufficient documentation

## 2022-12-08 DIAGNOSIS — Z79810 Long term (current) use of selective estrogen receptor modulators (SERMs): Secondary | ICD-10-CM | POA: Insufficient documentation

## 2022-12-09 DIAGNOSIS — D0512 Intraductal carcinoma in situ of left breast: Secondary | ICD-10-CM | POA: Diagnosis not present

## 2022-12-15 ENCOUNTER — Encounter: Payer: Self-pay | Admitting: *Deleted

## 2022-12-15 DIAGNOSIS — D0512 Intraductal carcinoma in situ of left breast: Secondary | ICD-10-CM

## 2022-12-22 ENCOUNTER — Encounter: Payer: Self-pay | Admitting: *Deleted

## 2022-12-23 ENCOUNTER — Ambulatory Visit
Admission: RE | Admit: 2022-12-23 | Discharge: 2022-12-23 | Disposition: A | Discharge status: Home / Self Care | Payer: Commercial Managed Care - PPO | Source: Ambulatory Visit | Attending: Radiation Oncology | Admitting: Radiation Oncology

## 2022-12-23 ENCOUNTER — Other Ambulatory Visit: Payer: Self-pay

## 2022-12-23 DIAGNOSIS — D0512 Intraductal carcinoma in situ of left breast: Secondary | ICD-10-CM | POA: Insufficient documentation

## 2022-12-23 DIAGNOSIS — Z510 Encounter for antineoplastic radiation therapy: Secondary | ICD-10-CM | POA: Diagnosis not present

## 2022-12-23 LAB — RAD ONC ARIA SESSION SUMMARY
Course Elapsed Days: 0
Plan Fractions Treated to Date: 1
Plan Prescribed Dose Per Fraction: 2.67 Gy
Plan Total Fractions Prescribed: 15
Plan Total Prescribed Dose: 40.05 Gy
Reference Point Dosage Given to Date: 2.67 Gy
Reference Point Session Dosage Given: 2.67 Gy
Session Number: 1

## 2022-12-24 ENCOUNTER — Ambulatory Visit
Admission: RE | Admit: 2022-12-24 | Discharge: 2022-12-24 | Disposition: A | Discharge status: Home / Self Care | Payer: Commercial Managed Care - PPO | Source: Ambulatory Visit | Attending: Radiation Oncology | Admitting: Radiation Oncology

## 2022-12-24 ENCOUNTER — Other Ambulatory Visit: Payer: Self-pay

## 2022-12-24 ENCOUNTER — Other Ambulatory Visit (HOSPITAL_COMMUNITY): Payer: Self-pay

## 2022-12-24 DIAGNOSIS — C50912 Malignant neoplasm of unspecified site of left female breast: Secondary | ICD-10-CM | POA: Diagnosis not present

## 2022-12-24 DIAGNOSIS — D0512 Intraductal carcinoma in situ of left breast: Secondary | ICD-10-CM | POA: Diagnosis not present

## 2022-12-24 DIAGNOSIS — Z510 Encounter for antineoplastic radiation therapy: Secondary | ICD-10-CM | POA: Diagnosis not present

## 2022-12-24 LAB — RAD ONC ARIA SESSION SUMMARY
Course Elapsed Days: 1
Plan Fractions Treated to Date: 2
Plan Prescribed Dose Per Fraction: 2.67 Gy
Plan Total Fractions Prescribed: 15
Plan Total Prescribed Dose: 40.05 Gy
Reference Point Dosage Given to Date: 5.34 Gy
Reference Point Session Dosage Given: 2.67 Gy
Session Number: 2

## 2022-12-25 ENCOUNTER — Ambulatory Visit
Admission: RE | Admit: 2022-12-25 | Discharge: 2022-12-25 | Disposition: A | Discharge status: Home / Self Care | Payer: Commercial Managed Care - PPO | Source: Ambulatory Visit | Attending: Radiation Oncology | Admitting: Radiation Oncology

## 2022-12-25 ENCOUNTER — Other Ambulatory Visit: Payer: Self-pay

## 2022-12-25 ENCOUNTER — Other Ambulatory Visit (HOSPITAL_COMMUNITY): Payer: Self-pay

## 2022-12-25 ENCOUNTER — Ambulatory Visit: Payer: Commercial Managed Care - PPO

## 2022-12-25 DIAGNOSIS — Z510 Encounter for antineoplastic radiation therapy: Secondary | ICD-10-CM | POA: Diagnosis not present

## 2022-12-25 DIAGNOSIS — D0512 Intraductal carcinoma in situ of left breast: Secondary | ICD-10-CM | POA: Diagnosis not present

## 2022-12-25 LAB — RAD ONC ARIA SESSION SUMMARY
Course Elapsed Days: 2
Plan Fractions Treated to Date: 3
Plan Prescribed Dose Per Fraction: 2.67 Gy
Plan Total Fractions Prescribed: 15
Plan Total Prescribed Dose: 40.05 Gy
Reference Point Dosage Given to Date: 8.01 Gy
Reference Point Session Dosage Given: 2.67 Gy
Session Number: 3

## 2022-12-25 MED ORDER — CHLORHEXIDINE GLUCONATE 0.12 % MT SOLN
Freq: Two times a day (BID) | OROMUCOSAL | 1 refills | Status: DC
  Filled 2022-12-25: qty 473, 15d supply, fill #0

## 2022-12-28 ENCOUNTER — Ambulatory Visit
Admission: RE | Admit: 2022-12-28 | Discharge: 2022-12-28 | Disposition: A | Discharge status: Home / Self Care | Payer: Commercial Managed Care - PPO | Source: Ambulatory Visit | Attending: Radiation Oncology | Admitting: Radiation Oncology

## 2022-12-28 ENCOUNTER — Other Ambulatory Visit: Payer: Self-pay

## 2022-12-28 ENCOUNTER — Ambulatory Visit: Payer: Commercial Managed Care - PPO | Admitting: Hematology and Oncology

## 2022-12-28 ENCOUNTER — Ambulatory Visit
Admission: RE | Admit: 2022-12-28 | Discharge: 2022-12-28 | Disposition: A | Discharge status: Home / Self Care | Payer: Commercial Managed Care - PPO | Source: Ambulatory Visit | Attending: Radiation Oncology

## 2022-12-28 DIAGNOSIS — D0512 Intraductal carcinoma in situ of left breast: Secondary | ICD-10-CM

## 2022-12-28 DIAGNOSIS — Z510 Encounter for antineoplastic radiation therapy: Secondary | ICD-10-CM | POA: Diagnosis not present

## 2022-12-28 LAB — RAD ONC ARIA SESSION SUMMARY
Course Elapsed Days: 5
Plan Fractions Treated to Date: 4
Plan Prescribed Dose Per Fraction: 2.67 Gy
Plan Total Fractions Prescribed: 15
Plan Total Prescribed Dose: 40.05 Gy
Reference Point Dosage Given to Date: 10.68 Gy
Reference Point Session Dosage Given: 2.67 Gy
Session Number: 4

## 2022-12-28 MED ORDER — ALRA NON-METALLIC DEODORANT (RAD-ONC)
1.0000 | Freq: Once | TOPICAL | Status: AC
  Administered 2022-12-28: 1 via TOPICAL

## 2022-12-28 MED ORDER — RADIAPLEXRX EX GEL
Freq: Once | CUTANEOUS | Status: AC
  Administered 2022-12-28: 1 via TOPICAL

## 2022-12-29 ENCOUNTER — Other Ambulatory Visit: Payer: Self-pay

## 2022-12-29 ENCOUNTER — Ambulatory Visit
Admission: RE | Admit: 2022-12-29 | Discharge: 2022-12-29 | Disposition: A | Discharge status: Home / Self Care | Payer: Commercial Managed Care - PPO | Source: Ambulatory Visit | Attending: Radiation Oncology

## 2022-12-29 DIAGNOSIS — Z510 Encounter for antineoplastic radiation therapy: Secondary | ICD-10-CM | POA: Diagnosis not present

## 2022-12-29 DIAGNOSIS — D0512 Intraductal carcinoma in situ of left breast: Secondary | ICD-10-CM | POA: Diagnosis not present

## 2022-12-29 LAB — RAD ONC ARIA SESSION SUMMARY
Course Elapsed Days: 6
Plan Fractions Treated to Date: 5
Plan Prescribed Dose Per Fraction: 2.67 Gy
Plan Total Fractions Prescribed: 15
Plan Total Prescribed Dose: 40.05 Gy
Reference Point Dosage Given to Date: 13.35 Gy
Reference Point Session Dosage Given: 2.67 Gy
Session Number: 5

## 2022-12-30 ENCOUNTER — Other Ambulatory Visit: Payer: Self-pay

## 2022-12-30 ENCOUNTER — Ambulatory Visit
Admission: RE | Admit: 2022-12-30 | Discharge: 2022-12-30 | Disposition: A | Discharge status: Home / Self Care | Payer: Commercial Managed Care - PPO | Source: Ambulatory Visit | Attending: Radiation Oncology | Admitting: Radiation Oncology

## 2022-12-30 DIAGNOSIS — D0512 Intraductal carcinoma in situ of left breast: Secondary | ICD-10-CM | POA: Diagnosis not present

## 2022-12-30 LAB — RAD ONC ARIA SESSION SUMMARY
Course Elapsed Days: 7
Plan Fractions Treated to Date: 6
Plan Prescribed Dose Per Fraction: 2.67 Gy
Plan Total Fractions Prescribed: 15
Plan Total Prescribed Dose: 40.05 Gy
Reference Point Dosage Given to Date: 16.02 Gy
Reference Point Session Dosage Given: 2.67 Gy
Session Number: 6

## 2022-12-31 ENCOUNTER — Other Ambulatory Visit: Payer: Self-pay

## 2022-12-31 ENCOUNTER — Ambulatory Visit
Admission: RE | Admit: 2022-12-31 | Discharge: 2022-12-31 | Disposition: A | Discharge status: Home / Self Care | Payer: Commercial Managed Care - PPO | Source: Ambulatory Visit | Attending: Radiation Oncology | Admitting: Radiation Oncology

## 2022-12-31 DIAGNOSIS — Z510 Encounter for antineoplastic radiation therapy: Secondary | ICD-10-CM | POA: Diagnosis not present

## 2022-12-31 DIAGNOSIS — D0512 Intraductal carcinoma in situ of left breast: Secondary | ICD-10-CM | POA: Diagnosis not present

## 2022-12-31 LAB — RAD ONC ARIA SESSION SUMMARY
Course Elapsed Days: 8
Plan Fractions Treated to Date: 7
Plan Prescribed Dose Per Fraction: 2.67 Gy
Plan Total Fractions Prescribed: 15
Plan Total Prescribed Dose: 40.05 Gy
Reference Point Dosage Given to Date: 18.69 Gy
Reference Point Session Dosage Given: 2.67 Gy
Session Number: 7

## 2023-01-01 ENCOUNTER — Other Ambulatory Visit: Payer: Self-pay

## 2023-01-01 ENCOUNTER — Ambulatory Visit
Admission: RE | Admit: 2023-01-01 | Discharge: 2023-01-01 | Disposition: A | Discharge status: Home / Self Care | Payer: Commercial Managed Care - PPO | Source: Ambulatory Visit | Attending: Radiation Oncology | Admitting: Radiation Oncology

## 2023-01-01 DIAGNOSIS — Z510 Encounter for antineoplastic radiation therapy: Secondary | ICD-10-CM | POA: Diagnosis not present

## 2023-01-01 DIAGNOSIS — D0512 Intraductal carcinoma in situ of left breast: Secondary | ICD-10-CM | POA: Diagnosis not present

## 2023-01-01 LAB — RAD ONC ARIA SESSION SUMMARY
Course Elapsed Days: 9
Plan Fractions Treated to Date: 8
Plan Prescribed Dose Per Fraction: 2.67 Gy
Plan Total Fractions Prescribed: 15
Plan Total Prescribed Dose: 40.05 Gy
Reference Point Dosage Given to Date: 21.36 Gy
Reference Point Session Dosage Given: 2.67 Gy
Session Number: 8

## 2023-01-04 ENCOUNTER — Ambulatory Visit
Admission: RE | Admit: 2023-01-04 | Discharge: 2023-01-04 | Disposition: A | Discharge status: Home / Self Care | Payer: Commercial Managed Care - PPO | Source: Ambulatory Visit | Attending: Radiation Oncology

## 2023-01-04 ENCOUNTER — Ambulatory Visit
Admission: RE | Admit: 2023-01-04 | Discharge: 2023-01-04 | Disposition: A | Discharge status: Home / Self Care | Payer: Commercial Managed Care - PPO | Source: Ambulatory Visit | Attending: Radiation Oncology | Admitting: Radiation Oncology

## 2023-01-04 ENCOUNTER — Other Ambulatory Visit: Payer: Self-pay

## 2023-01-04 DIAGNOSIS — Z510 Encounter for antineoplastic radiation therapy: Secondary | ICD-10-CM | POA: Diagnosis not present

## 2023-01-04 DIAGNOSIS — D0512 Intraductal carcinoma in situ of left breast: Secondary | ICD-10-CM | POA: Diagnosis not present

## 2023-01-04 LAB — RAD ONC ARIA SESSION SUMMARY
Course Elapsed Days: 12
Plan Fractions Treated to Date: 9
Plan Prescribed Dose Per Fraction: 2.67 Gy
Plan Total Fractions Prescribed: 15
Plan Total Prescribed Dose: 40.05 Gy
Reference Point Dosage Given to Date: 24.03 Gy
Reference Point Session Dosage Given: 2.67 Gy
Session Number: 9

## 2023-01-05 ENCOUNTER — Other Ambulatory Visit: Payer: Self-pay

## 2023-01-05 ENCOUNTER — Ambulatory Visit
Admission: RE | Admit: 2023-01-05 | Discharge: 2023-01-05 | Disposition: A | Discharge status: Home / Self Care | Payer: Commercial Managed Care - PPO | Source: Ambulatory Visit | Attending: Radiation Oncology

## 2023-01-05 DIAGNOSIS — Z510 Encounter for antineoplastic radiation therapy: Secondary | ICD-10-CM | POA: Diagnosis not present

## 2023-01-05 DIAGNOSIS — D0512 Intraductal carcinoma in situ of left breast: Secondary | ICD-10-CM | POA: Diagnosis not present

## 2023-01-05 LAB — RAD ONC ARIA SESSION SUMMARY
Course Elapsed Days: 13
Plan Fractions Treated to Date: 10
Plan Prescribed Dose Per Fraction: 2.67 Gy
Plan Total Fractions Prescribed: 15
Plan Total Prescribed Dose: 40.05 Gy
Reference Point Dosage Given to Date: 26.7 Gy
Reference Point Session Dosage Given: 2.67 Gy
Session Number: 10

## 2023-01-06 ENCOUNTER — Ambulatory Visit
Admission: RE | Admit: 2023-01-06 | Discharge: 2023-01-06 | Disposition: A | Discharge status: Home / Self Care | Payer: Commercial Managed Care - PPO | Source: Ambulatory Visit | Attending: Radiation Oncology

## 2023-01-06 ENCOUNTER — Other Ambulatory Visit: Payer: Self-pay

## 2023-01-06 DIAGNOSIS — Z510 Encounter for antineoplastic radiation therapy: Secondary | ICD-10-CM | POA: Diagnosis not present

## 2023-01-06 DIAGNOSIS — D0512 Intraductal carcinoma in situ of left breast: Secondary | ICD-10-CM | POA: Diagnosis not present

## 2023-01-06 LAB — RAD ONC ARIA SESSION SUMMARY
Course Elapsed Days: 14
Plan Fractions Treated to Date: 11
Plan Prescribed Dose Per Fraction: 2.67 Gy
Plan Total Fractions Prescribed: 15
Plan Total Prescribed Dose: 40.05 Gy
Reference Point Dosage Given to Date: 29.37 Gy
Reference Point Session Dosage Given: 2.67 Gy
Session Number: 11

## 2023-01-07 ENCOUNTER — Ambulatory Visit
Admission: RE | Admit: 2023-01-07 | Discharge: 2023-01-07 | Disposition: A | Discharge status: Home / Self Care | Payer: Commercial Managed Care - PPO | Source: Ambulatory Visit | Attending: Radiation Oncology | Admitting: Radiation Oncology

## 2023-01-07 ENCOUNTER — Other Ambulatory Visit: Payer: Self-pay

## 2023-01-07 DIAGNOSIS — Z510 Encounter for antineoplastic radiation therapy: Secondary | ICD-10-CM | POA: Diagnosis not present

## 2023-01-07 DIAGNOSIS — D0512 Intraductal carcinoma in situ of left breast: Secondary | ICD-10-CM | POA: Diagnosis not present

## 2023-01-07 LAB — RAD ONC ARIA SESSION SUMMARY
Course Elapsed Days: 15
Plan Fractions Treated to Date: 12
Plan Prescribed Dose Per Fraction: 2.67 Gy
Plan Total Fractions Prescribed: 15
Plan Total Prescribed Dose: 40.05 Gy
Reference Point Dosage Given to Date: 32.04 Gy
Reference Point Session Dosage Given: 2.67 Gy
Session Number: 12

## 2023-01-08 ENCOUNTER — Other Ambulatory Visit: Payer: Self-pay

## 2023-01-08 ENCOUNTER — Ambulatory Visit
Admission: RE | Admit: 2023-01-08 | Discharge: 2023-01-08 | Disposition: A | Discharge status: Home / Self Care | Payer: Commercial Managed Care - PPO | Source: Ambulatory Visit | Attending: Radiation Oncology

## 2023-01-08 DIAGNOSIS — D0512 Intraductal carcinoma in situ of left breast: Secondary | ICD-10-CM | POA: Diagnosis not present

## 2023-01-08 DIAGNOSIS — Z510 Encounter for antineoplastic radiation therapy: Secondary | ICD-10-CM | POA: Diagnosis not present

## 2023-01-08 LAB — RAD ONC ARIA SESSION SUMMARY
Course Elapsed Days: 16
Plan Fractions Treated to Date: 13
Plan Prescribed Dose Per Fraction: 2.67 Gy
Plan Total Fractions Prescribed: 15
Plan Total Prescribed Dose: 40.05 Gy
Reference Point Dosage Given to Date: 34.71 Gy
Reference Point Session Dosage Given: 2.67 Gy
Session Number: 13

## 2023-01-11 ENCOUNTER — Other Ambulatory Visit: Payer: Self-pay

## 2023-01-11 ENCOUNTER — Ambulatory Visit
Admission: RE | Admit: 2023-01-11 | Discharge: 2023-01-11 | Disposition: A | Discharge status: Home / Self Care | Payer: Commercial Managed Care - PPO | Source: Ambulatory Visit | Attending: Radiation Oncology

## 2023-01-11 ENCOUNTER — Other Ambulatory Visit (HOSPITAL_COMMUNITY): Payer: Self-pay

## 2023-01-11 DIAGNOSIS — D0512 Intraductal carcinoma in situ of left breast: Secondary | ICD-10-CM | POA: Diagnosis not present

## 2023-01-11 DIAGNOSIS — Z510 Encounter for antineoplastic radiation therapy: Secondary | ICD-10-CM | POA: Diagnosis not present

## 2023-01-11 LAB — RAD ONC ARIA SESSION SUMMARY
Course Elapsed Days: 19
Plan Fractions Treated to Date: 14
Plan Prescribed Dose Per Fraction: 2.67 Gy
Plan Total Fractions Prescribed: 15
Plan Total Prescribed Dose: 40.05 Gy
Reference Point Dosage Given to Date: 37.38 Gy
Reference Point Session Dosage Given: 2.67 Gy
Session Number: 14

## 2023-01-12 ENCOUNTER — Ambulatory Visit
Admission: RE | Admit: 2023-01-12 | Discharge: 2023-01-12 | Disposition: A | Discharge status: Home / Self Care | Payer: Commercial Managed Care - PPO | Source: Ambulatory Visit | Attending: Radiation Oncology | Admitting: Radiation Oncology

## 2023-01-12 ENCOUNTER — Other Ambulatory Visit: Payer: Self-pay

## 2023-01-12 DIAGNOSIS — D0512 Intraductal carcinoma in situ of left breast: Secondary | ICD-10-CM | POA: Diagnosis not present

## 2023-01-12 DIAGNOSIS — Z510 Encounter for antineoplastic radiation therapy: Secondary | ICD-10-CM | POA: Diagnosis not present

## 2023-01-12 LAB — RAD ONC ARIA SESSION SUMMARY
Course Elapsed Days: 20
Plan Fractions Treated to Date: 15
Plan Prescribed Dose Per Fraction: 2.67 Gy
Plan Total Fractions Prescribed: 15
Plan Total Prescribed Dose: 40.05 Gy
Reference Point Dosage Given to Date: 40.05 Gy
Reference Point Session Dosage Given: 2.67 Gy
Session Number: 15

## 2023-01-13 NOTE — Radiation Completion Notes (Signed)
Patient Name: Vicki Krueger, Vicki Krueger MRN: [id number] Date of Birth: 1951/02/16 Referring Physician: Serena Croissant, M.D. Date of Service: 2023-01-13 Radiation Oncologist: Lonie Peak, M.D. [facility] [place]                             RADIATION ONCOLOGY END OF TREATMENT NOTE     Diagnosis: D05.12 Intraductal carcinoma in situ of left breast Staging on 2022-12-08: Ductal carcinoma in situ (DCIS) of left breast T=pTis (DCIS), N=pN0, M=cM0 Staging on 2022-10-21: Ductal carcinoma in situ (DCIS) of left breast T=cTis (DCIS), N=cN0, M=cM0 Intent: Curative     ==========DELIVERED PLANS==========  First Treatment Date: 2022-12-23 - Last Treatment Date: 2023-01-12   Plan Name: Breast_L_BH Site: Breast, Left Technique: 3D Mode: Photon Dose Per Fraction: 2.67 Gy Prescribed Dose (Delivered / Prescribed): 40.05 Gy / 40.05 Gy Prescribed Fxs (Delivered / Prescribed): 15 / 15     ==========ON TREATMENT VISIT DATES========== 2022-12-28, 2023-01-04, 2023-01-11     ==========UPCOMING VISITS==========       ==========APPENDIX - ON TREATMENT VISIT NOTES==========   See weekly On Treatment Notes in Epic for details.

## 2023-01-14 ENCOUNTER — Other Ambulatory Visit (HOSPITAL_COMMUNITY): Payer: Self-pay

## 2023-01-18 ENCOUNTER — Telehealth: Payer: Self-pay | Admitting: Adult Health

## 2023-01-18 ENCOUNTER — Other Ambulatory Visit (HOSPITAL_COMMUNITY): Payer: Self-pay

## 2023-01-18 DIAGNOSIS — G5702 Lesion of sciatic nerve, left lower limb: Secondary | ICD-10-CM | POA: Diagnosis not present

## 2023-01-18 DIAGNOSIS — M545 Low back pain, unspecified: Secondary | ICD-10-CM | POA: Diagnosis not present

## 2023-01-18 MED ORDER — TIZANIDINE HCL 4 MG PO TABS
4.0000 mg | ORAL_TABLET | Freq: Three times a day (TID) | ORAL | 0 refills | Status: DC
  Filled 2023-01-18: qty 30, 10d supply, fill #0

## 2023-01-18 NOTE — Telephone Encounter (Signed)
Per IB message on 01/08/23. I called patient and scheduled Survivorship Care plan appointment. Patient is aware of date and time.

## 2023-01-22 ENCOUNTER — Other Ambulatory Visit (HOSPITAL_COMMUNITY): Payer: Self-pay

## 2023-01-22 ENCOUNTER — Inpatient Hospital Stay: Payer: Commercial Managed Care - PPO | Attending: Hematology and Oncology | Admitting: Hematology and Oncology

## 2023-01-22 VITALS — BP 128/65 | HR 80 | Temp 97.7°F | Resp 18 | Ht 59.0 in | Wt 190.5 lb

## 2023-01-22 DIAGNOSIS — D0512 Intraductal carcinoma in situ of left breast: Secondary | ICD-10-CM | POA: Insufficient documentation

## 2023-01-22 DIAGNOSIS — Z170 Estrogen receptor positive status [ER+]: Secondary | ICD-10-CM | POA: Diagnosis not present

## 2023-01-22 DIAGNOSIS — Z923 Personal history of irradiation: Secondary | ICD-10-CM | POA: Diagnosis not present

## 2023-01-22 MED ORDER — ANASTROZOLE 1 MG PO TABS
1.0000 mg | ORAL_TABLET | Freq: Every day | ORAL | 3 refills | Status: DC
  Filled 2023-01-22: qty 90, 90d supply, fill #0
  Filled 2023-04-22: qty 90, 90d supply, fill #1
  Filled 2023-07-13: qty 90, 90d supply, fill #2
  Filled 2023-10-19: qty 90, 90d supply, fill #3

## 2023-01-22 NOTE — Assessment & Plan Note (Signed)
10/09/2022:Screening mammogram detected left breast calcifications measuring 0.3 cm, stereotactic biopsy with intermediate grade DCIS with necrosis and calcifications ER 100%, PR 80%  11/12/2022: No residual DCIS identified in the surgery specimen. 12/24/2022-01/12/2023: Adjuvant radiation  Treatment plan: Adjuvant tamoxifen x 5 years  Tamoxifen counseling: We discussed the risks and benefits of tamoxifen. These include but not limited to insomnia, hot flashes, mood changes, vaginal dryness, and weight gain. Although rare, serious side effects including endometrial cancer, risk of blood clots were also discussed. We strongly believe that the benefits far outweigh the risks. Patient understands these risks and consented to starting treatment. Planned treatment duration is 5 years.  I discussed with her the role of low-dose tamoxifen.  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 Return to clinic in 3 months for survivorship care plan visit

## 2023-01-22 NOTE — Progress Notes (Signed)
Patient Care Team: Thana Ates, MD as PCP - General (Internal Medicine) Serena Croissant, MD as Consulting Physician (Hematology and Oncology) Lonie Peak, MD as Attending Physician (Radiation Oncology) Abigail Miyamoto, MD as Consulting Physician (General Surgery) Donnelly Angelica, RN as Oncology Nurse Navigator Pershing Proud, RN as Oncology Nurse Navigator  DIAGNOSIS:  Encounter Diagnosis  Name Primary?   Ductal carcinoma in situ (DCIS) of left breast Yes    SUMMARY OF ONCOLOGIC HISTORY: Oncology History  Ductal carcinoma in situ (DCIS) of left breast  10/09/2022 Initial Diagnosis   Screening mammogram detected left breast calcifications measuring 0.3 cm, stereotactic biopsy with intermediate grade DCIS with necrosis and calcifications ER 100%, PR 80%   10/21/2022 Cancer Staging   Staging form: Breast, AJCC 8th Edition - Clinical: Stage 0 (cTis (DCIS), cN0, cM0, G2, ER+, PR+, HER2: Not Assessed) - Signed by Serena Croissant, MD on 10/21/2022 Stage prefix: Initial diagnosis Histologic grading system: 3 grade system   10/27/2022 Genetic Testing   Negative [facility] Common Hereditary Cancers +RNA Panel.  Report date is 10/27/2022.    The [facility] Common Hereditary Cancers + RNA Panel includes sequencing, deletion/duplication, and RNA analysis of the following 48 genes: APC, ATM, AXIN2, BAP1, BARD1, BMPR1A, BRCA1, BRCA2, BRIP1, CDH1, CDK4*, CDKN2A*, CHEK2, CTNNA1, DICER1, EPCAM* (del/dup only), FH, GREM1* (promoter dup analysis only), HOXB13*, KIT*, MBD4*, MEN1, MLH1, MSH2, MSH3, MSH6, MUTYH, NF1, NTHL1, PALB2, PDGFRA*, PMS2, POLD1, POLE, PTEN, RAD51C, RAD51D, SDHA (sequencing only), SDHB, SDHC, SDHD, SMAD4, SMARCA4, STK11, TP53, TSC1, TSC2, VHL.  *Genes without RNA analysis.    11/12/2022 Surgery   Left lumpectomy: No residual DCIS identified.     CHIEF COMPLIANT: Follow-up after radiation is complete  History of Present Illness   The patient, with a history of DCIS, presents for  follow-up after completing radiation therapy. She reports a minor burn from the radiation, but overall tolerated the treatment well. She has been prescribed tamoxifen as part of her ongoing treatment plan. However, during the discussion of potential side effects, the patient revealed a past history of a blood clot in the left leg, which required hospitalization and treatment with Heparin. Given this history, the decision was made to switch the patient to anastrozole instead of tamoxifen. The patient also reports a history of early menopause, with cessation of menstrual cycles around age 28-47. She has a history of hot flashes, but these have resolved. She is currently taking vitamin D and has regular bone density scans to monitor for osteoporosis, with the next scan due in December. The patient also mentions an ongoing issue with her back, for which she has seen her primary care team and been prescribed Xanax.        ALLERGIES:  is allergic to ergotamine-caffeine, aspirin, other, pseudoephedrine, stadol [butorphanol], doxycycline, and morphine and codeine.  MEDICATIONS:  Current Outpatient Medications  Medication Sig Dispense Refill   anastrozole (ARIMIDEX) 1 MG tablet Take 1 tablet (1 mg total) by mouth daily. 90 tablet 3   atorvastatin (LIPITOR) 10 MG tablet Take 1 tablet (10 mg total) by mouth daily. 90 tablet 3   chlorhexidine (PERIDEX) 0.12 % solution Rinse with one capful and spit twice daily 473 mL 1   cholecalciferol (VITAMIN D) 1000 UNITS tablet Take 1,000 Units by mouth daily.     cyclobenzaprine (FLEXERIL) 10 MG tablet Take 10 mg by mouth at bedtime as needed for muscle spasms.     furosemide (LASIX) 20 MG tablet Take 1 tablet (20 mg total) by  mouth daily for 5 days a week 70 tablet 3   metoprolol succinate (TOPROL-XL) 25 MG 24 hr tablet Take 1 tablet (25 mg total) by mouth daily. 90 tablet 3   Multiple Vitamins-Minerals (CENTRUM SILVER ADULT 50+) TABS Take 1 tablet by mouth daily.      omeprazole (PRILOSEC) 20 MG capsule Take 1 capsule (20 mg total) by mouth. 1 to 2 times per day 90 capsule 4   spironolactone (ALDACTONE) 25 MG tablet Take 2 tablets (50 mg total) by mouth daily once daily 180 tablet 3   tiZANidine (ZANAFLEX) 4 MG tablet Take 1 tablet (4 mg total) by mouth 3 (three) times daily as needed for muscle spasm. 30 tablet 0   topiramate (TOPAMAX) 50 MG tablet Take 1 tablet (50 mg total) by mouth 2 (two) times daily. 180 tablet 3   No current facility-administered medications for this visit.   Facility-Administered Medications Ordered in Other Visits  Medication Dose Route Frequency Provider Last Rate Last Admin   sodium chloride irrigation 0.9 %    PRN Eugenia Mcalpine, MD   6,000 mL at 06/12/11 0705    PHYSICAL EXAMINATION: ECOG PERFORMANCE STATUS: 1 - Symptomatic but completely ambulatory  Vitals:   01/22/23 1137  BP: 128/65  Pulse: 80  Resp: 18  Temp: 97.7 F (36.5 C)  SpO2: 99%   Filed Weights   01/22/23 1137  Weight: 190 lb 8 oz (86.4 kg)      LABORATORY DATA:  I have reviewed the data as listed    Latest Ref Rng & Units 10/21/2022    1:05 PM 04/09/2021    1:57 PM 03/07/2018   10:13 PM  CMP  Glucose 70 - 99 mg/dL 98  161  096   BUN 8 - 23 mg/dL 15  16  17    Creatinine 0.44 - 1.00 mg/dL 0.45  4.09  8.11   Sodium 135 - 145 mmol/L 143  144  141   Potassium 3.5 - 5.1 mmol/L 3.9  3.7  3.5   Chloride 98 - 111 mmol/L 108  110  110   CO2 22 - 32 mmol/L 27  26    Calcium 8.9 - 10.3 mg/dL 9.9  9.2    Total Protein 6.5 - 8.1 g/dL 6.7     Total Bilirubin 0.3 - 1.2 mg/dL 0.3     Alkaline Phos 38 - 126 U/L 99     AST 15 - 41 U/L 22     ALT 0 - 44 U/L 21       Lab Results  Component Value Date   WBC 6.8 10/21/2022   HGB 14.3 10/21/2022   HCT 44.0 10/21/2022   MCV 96.3 10/21/2022   PLT 193 10/21/2022   NEUTROABS 3.8 10/21/2022    ASSESSMENT & PLAN:  Ductal carcinoma in situ (DCIS) of left breast 10/09/2022:Screening mammogram detected left  breast calcifications measuring 0.3 cm, stereotactic biopsy with intermediate grade DCIS with necrosis and calcifications ER 100%, PR 80%  11/12/2022: No residual DCIS identified in the surgery specimen. 12/24/2022-01/12/2023: Adjuvant radiation  Treatment plan: Adjuvant anastrozole x 5 years (chosen because she has a previous history of blood clot)  Anastrozole counseling: We discussed the risks and benefits of anti-estrogen therapy with aromatase inhibitors. These include but not limited to insomnia, hot flashes, mood changes, vaginal dryness, bone density loss, and weight gain. We strongly believe that the benefits far outweigh the risks. Patient understands these risks and consented to starting treatment. Planned treatment duration  is 5 years.   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 Return to clinic in 3 months for survivorship care plan visit     No orders of the defined types were placed in this encounter.  The patient has a good understanding of the overall plan. she agrees with it. she will call with any problems that may develop before the next visit here. Total time spent: 30 mins including face to face time and time spent for planning, charting and co-ordination of care   Tamsen Meek, MD 01/22/23

## 2023-01-28 NOTE — Progress Notes (Signed)
Vicki Krueger presents today for follow-up after completing radiation to her left breast on 01-12-23.   Pain: no pan to report Skin: skin is doing well, using vitamin E oil to skin, encouraged use for two months ROM: no concerns Fatigue: doing well, able to work 12 hour shifts presently Lymphedema: none to report MedOnc F/U: Lillard Anes on 04-29-23.  Other issues of note: No concerns or questions at this time. Pt overall dong well.   Pt reports Yes No Comments  Tamoxifen []  [x]    Letrozole []  [x]    Anastrazole [x]  []  1mg   Mammogram [x]  Date:  []  Encouraged to keep yearly mammogram appointments.

## 2023-02-04 ENCOUNTER — Other Ambulatory Visit (HOSPITAL_COMMUNITY): Payer: Self-pay

## 2023-02-04 DIAGNOSIS — R32 Unspecified urinary incontinence: Secondary | ICD-10-CM | POA: Diagnosis not present

## 2023-02-04 MED ORDER — TOLTERODINE TARTRATE ER 2 MG PO CP24
2.0000 mg | ORAL_CAPSULE | Freq: Every day | ORAL | 1 refills | Status: DC
Start: 2023-02-04 — End: 2023-04-12
  Filled 2023-02-04: qty 30, 30d supply, fill #0
  Filled 2023-03-05: qty 30, 30d supply, fill #1

## 2023-02-11 ENCOUNTER — Telehealth: Payer: Self-pay

## 2023-02-11 ENCOUNTER — Encounter: Payer: Self-pay | Admitting: Radiation Oncology

## 2023-02-11 ENCOUNTER — Ambulatory Visit
Admission: RE | Admit: 2023-02-11 | Discharge: 2023-02-11 | Disposition: A | Discharge status: Home / Self Care | Payer: Commercial Managed Care - PPO | Source: Ambulatory Visit | Attending: Radiation Oncology | Admitting: Radiation Oncology

## 2023-02-11 NOTE — Telephone Encounter (Signed)
 RN called pt for telephone follow up. Note completed and routed to Dr. Basilio Cairo for review.

## 2023-02-16 ENCOUNTER — Telehealth: Payer: Self-pay

## 2023-02-16 NOTE — Telephone Encounter (Signed)
Notified Patient of completion of Critical Illness Form. Fax transmission confirmation received. Copy of form placed for pick-up and emailed to Patient as requested. No other needs or concerns voiced at this time.

## 2023-03-05 ENCOUNTER — Other Ambulatory Visit (HOSPITAL_COMMUNITY): Payer: Self-pay

## 2023-03-05 MED ORDER — OMEPRAZOLE 40 MG PO CPDR
40.0000 mg | DELAYED_RELEASE_CAPSULE | Freq: Two times a day (BID) | ORAL | 1 refills | Status: DC
  Filled 2023-03-05: qty 180, 90d supply, fill #0
  Filled 2023-07-13: qty 180, 90d supply, fill #1

## 2023-03-22 ENCOUNTER — Other Ambulatory Visit (HOSPITAL_COMMUNITY): Payer: Self-pay

## 2023-03-30 ENCOUNTER — Inpatient Hospital Stay: Admission: RE | Admit: 2023-03-30 | Payer: Commercial Managed Care - PPO | Source: Ambulatory Visit

## 2023-04-01 ENCOUNTER — Other Ambulatory Visit: Payer: Self-pay | Admitting: Internal Medicine

## 2023-04-01 DIAGNOSIS — Z780 Asymptomatic menopausal state: Secondary | ICD-10-CM

## 2023-04-09 ENCOUNTER — Other Ambulatory Visit (HOSPITAL_COMMUNITY): Payer: Self-pay

## 2023-04-12 ENCOUNTER — Other Ambulatory Visit: Payer: Self-pay

## 2023-04-12 ENCOUNTER — Other Ambulatory Visit (HOSPITAL_COMMUNITY): Payer: Self-pay

## 2023-04-12 MED ORDER — TOLTERODINE TARTRATE ER 2 MG PO CP24
2.0000 mg | ORAL_CAPSULE | Freq: Every day | ORAL | 1 refills | Status: DC
  Filled 2023-04-12: qty 30, 30d supply, fill #0
  Filled 2023-05-13: qty 30, 30d supply, fill #1

## 2023-04-13 ENCOUNTER — Other Ambulatory Visit: Payer: Self-pay

## 2023-04-13 ENCOUNTER — Other Ambulatory Visit (HOSPITAL_BASED_OUTPATIENT_CLINIC_OR_DEPARTMENT_OTHER): Payer: Self-pay

## 2023-04-13 ENCOUNTER — Other Ambulatory Visit (HOSPITAL_COMMUNITY): Payer: Self-pay

## 2023-04-22 ENCOUNTER — Other Ambulatory Visit (HOSPITAL_COMMUNITY): Payer: Self-pay

## 2023-04-26 ENCOUNTER — Encounter: Payer: Commercial Managed Care - PPO | Admitting: Adult Health

## 2023-04-29 ENCOUNTER — Encounter: Payer: Self-pay | Admitting: Adult Health

## 2023-04-29 ENCOUNTER — Inpatient Hospital Stay: Payer: Commercial Managed Care - PPO | Attending: Hematology and Oncology | Admitting: Adult Health

## 2023-04-29 VITALS — BP 148/74 | HR 80 | Temp 97.2°F | Resp 18 | Wt 192.4 lb

## 2023-04-29 DIAGNOSIS — Z79811 Long term (current) use of aromatase inhibitors: Secondary | ICD-10-CM | POA: Insufficient documentation

## 2023-04-29 DIAGNOSIS — D0512 Intraductal carcinoma in situ of left breast: Secondary | ICD-10-CM | POA: Diagnosis not present

## 2023-04-29 DIAGNOSIS — Z923 Personal history of irradiation: Secondary | ICD-10-CM | POA: Diagnosis not present

## 2023-04-29 NOTE — Progress Notes (Signed)
 SURVIVORSHIP VISIT:  BRIEF ONCOLOGIC HISTORY:  Oncology History  Ductal carcinoma in situ (DCIS) of left breast  10/09/2022 Initial Diagnosis   Screening mammogram detected left breast calcifications measuring 0.3 cm, stereotactic biopsy with intermediate grade DCIS with necrosis and calcifications ER 100%, PR 80%   10/21/2022 Cancer Staging   Staging form: Breast, AJCC 8th Edition - Clinical: Stage 0 (cTis (DCIS), cN0, cM0, G2, ER+, PR+, HER2: Not Assessed) - Signed by Odean Potts, MD on 10/21/2022 Stage prefix: Initial diagnosis Histologic grading system: 3 grade system   10/27/2022 Genetic Testing   Negative [facility] Common Hereditary Cancers +RNA Panel.  Report date is 10/27/2022.    The [facility] Common Hereditary Cancers + RNA Panel includes sequencing, deletion/duplication, and RNA analysis of the following 48 genes: APC, ATM, AXIN2, BAP1, BARD1, BMPR1A, BRCA1, BRCA2, BRIP1, CDH1, CDK4*, CDKN2A*, CHEK2, CTNNA1, DICER1, EPCAM* (del/dup only), FH, GREM1* (promoter dup analysis only), HOXB13*, KIT*, MBD4*, MEN1, MLH1, MSH2, MSH3, MSH6, MUTYH, NF1, NTHL1, PALB2, PDGFRA*, PMS2, POLD1, POLE, PTEN, RAD51C, RAD51D, SDHA (sequencing only), SDHB, SDHC, SDHD, SMAD4, SMARCA4, STK11, TP53, TSC1, TSC2, VHL.  *Genes without RNA analysis.    11/12/2022 Surgery   Left lumpectomy: No residual DCIS identified.   12/23/2022 - 01/12/2023 Radiation Therapy   Plan Name: Breast_L_BH Site: Breast, Left Technique: 3D Mode: Photon Dose Per Fraction: 2.67 Gy Prescribed Dose (Delivered / Prescribed): 40.05 Gy / 40.05 Gy Prescribed Fxs (Delivered / Prescribed): 15 / 15   01/2023 -  Anti-estrogen oral therapy   Anastrozole  x 5 years     INTERVAL HISTORY:    Vicki Krueger to review her survivorship care plan detailing her treatment course for breast cancer, as well as monitoring long-term side effects of that treatment, education regarding health maintenance, screening, and overall wellness and health promotion.      Overall, Vicki Krueger reports feeling quite well today.  She has been tolerating anastrozole  well with minimal concerns.  REVIEW OF SYSTEMS:  Review of Systems  Constitutional:  Negative for appetite change, chills, fatigue, fever and unexpected weight change.  HENT:   Negative for hearing loss, lump/mass and trouble swallowing.   Eyes:  Negative for eye problems and icterus.  Respiratory:  Negative for chest tightness, cough and shortness of breath.   Cardiovascular:  Negative for chest pain, leg swelling and palpitations.  Gastrointestinal:  Negative for abdominal distention, abdominal pain, constipation, diarrhea, nausea and vomiting.  Endocrine: Negative for hot flashes.  Genitourinary:  Negative for difficulty urinating.   Musculoskeletal:  Negative for arthralgias.  Skin:  Negative for itching and rash.  Neurological:  Negative for dizziness, extremity weakness, headaches and numbness.  Hematological:  Negative for adenopathy. Does not bruise/bleed easily.  Psychiatric/Behavioral:  Negative for depression. The patient is not nervous/anxious.    Breast: Denies any new nodularity, masses, tenderness, nipple changes, or nipple discharge.       PAST MEDICAL/SURGICAL HISTORY:  Past Medical History:  Diagnosis Date   Acute meniscal tear of knee RIGHT   Arthritis    Cataract immature    Esophageal stricture    GERD (gastroesophageal reflux disease)    H/O cardiac murmur    H/O hiatal hernia    Heart murmur    Hepatitis 04/20/2004   treatment x 1 year no problem now   History of esophageal dilatation    History of hepatitis C 2006  PER BLOOD TEST -- TX FOR 1 YR--  NO SYMPTOMS SINCE--  POSS. DUE TO NEEDLE STICK  (PT A NURSE)  History of meningitis X4  LAST HAD IN 2007   Hypertension    Hypertension    Intermittent palpitations 07/27/2013   occasional-tx. Metoprolol  as needed   Left knee pain    Swelling of right knee joint    Past Surgical History:  Procedure Laterality  Date   APPENDECTOMY     APPENDECTOMY     BREAST BIOPSY Left 10/09/2022   MM LT BREAST BX W LOC DEV EA AD LESION IMG BX SPEC STEREO GUIDE 10/09/2022 [facility]   BREAST BIOPSY Left 10/09/2022   MM LT BREAST BX W LOC DEV 1ST LESION IMAGE BX SPEC STEREO GUIDE 10/09/2022 [facility]   BREAST BIOPSY  11/10/2022   MM LT RADIOACTIVE SEED LOC MAMMO GUIDE 11/10/2022 [facility]   BREAST LUMPECTOMY WITH RADIOACTIVE SEED LOCALIZATION Left 11/12/2022   Procedure: LEFT BREAST LUMPECTOMY WITH RADIOACTIVE SEED LOCALIZATION;  Surgeon: Vernetta Berg, MD;  Location: [facility];  Service: General;  Laterality: Left;   CESAREAN SECTION  04/21/1987   COLONOSCOPY WITH PROPOFOL  N/A 08/15/2013   Procedure: COLONOSCOPY WITH PROPOFOL ;  Surgeon: Gladis MARLA Louder, MD;  Location: [facility];  Service: Endoscopy;  Laterality: N/A;   DILATION AND CURETTAGE OF UTERUS     DX LAPAROSCOPY  GYN   ESOPHAGEAL MANOMETRY N/A 09/25/2013   Procedure: ESOPHAGEAL MANOMETRY (EM);  Surgeon: Gladis MARLA Louder, MD;  Location: [facility];  Service: Endoscopy;  Laterality: N/A;   ESOPHAGOGASTRODUODENOSCOPY (EGD) WITH PROPOFOL  N/A 08/15/2013   Procedure: ESOPHAGOGASTRODUODENOSCOPY (EGD) WITH PROPOFOL ;  Surgeon: Gladis MARLA Louder, MD;  Location: [facility];  Service: Endoscopy;  Laterality: N/A;   HYSTEROSCOPY WITH D & C  07/20/2002   JOINT REPLACEMENT     s/p RTKA   KNEE ARTHROSCOPY  06/11/2011   Procedure: ARTHROSCOPY KNEE;  Surgeon: Lamar Prentice Collet, MD;  Location: [facility];  Service: Orthopedics;;  with debridement  Local with MAC   LAPAROSCOPIC CHOLECYSTECTOMY  08/19/2002   LEFT KNEE ARTHROSCOPY  X2  LAST ONE 2000   SHOULDER ARTHROSCOPY WITH ROTATOR CUFF REPAIR Left 04/15/2021   Procedure: SHOULDER ARTHROSCOPY WITH SUBACROMIAL DECOMPRESSION, DISTAL CLAVICLE RESECTION, ROTATOR CUFF REPAIR,BICEPS TENOTOMY, REMOVAL LOOSE BODIES;  Surgeon: Collet Lamar, MD;  Location: [facility];  Service: Orthopedics;  Laterality: Left;  with interscalene block   TONSILLECTOMY AND ADENOIDECTOMY  CHILD   TOTAL KNEE ARTHROPLASTY Right 08/10/2012   Procedure: TOTAL KNEE ARTHROPLASTY;  Surgeon: Dempsey LULLA Moan, MD;  Location: [facility];  Service: Orthopedics;  Laterality: Right;   TUBAL LIGATION       ALLERGIES:  Allergies  Allergen Reactions   Ergotamine-Caffeine Nausea And Vomiting   Aspirin Other (See Comments)    GI UPSET   Other Other (See Comments)    Actifed - hyper   Pseudoephedrine Other (See Comments)    HYPER   Stadol [Butorphanol]     Hypotension    Doxycycline Nausea Only   Morphine And Codeine Rash     CURRENT MEDICATIONS:  Outpatient Encounter Medications as of 04/29/2023  Medication Sig   anastrozole  (ARIMIDEX ) 1 MG tablet Take 1 tablet (1 mg total) by mouth daily.   atorvastatin  (LIPITOR) 10 MG tablet Take 1 tablet (10 mg total) by mouth daily.   chlorhexidine  (PERIDEX ) 0.12 % solution Rinse with one capful and spit twice daily   cholecalciferol (VITAMIN D) 1000 UNITS tablet Take 1,000 Units by mouth daily.   cyclobenzaprine (FLEXERIL) 10 MG tablet Take 10 mg by mouth at bedtime as needed for  muscle spasms.   furosemide  (LASIX ) 20 MG tablet Take 1 tablet (20 mg total) by mouth daily for 5 days a week   metoprolol  succinate (TOPROL -XL) 25 MG 24 hr tablet Take 1 tablet (25 mg total) by mouth daily.   Multiple Vitamins-Minerals (CENTRUM SILVER ADULT 50+) TABS Take 1 tablet by mouth daily.   omeprazole  (PRILOSEC) 40 MG capsule Take 1 capsule (40 mg total) by mouth 1 to 2 times daily.   spironolactone  (ALDACTONE ) 25 MG tablet Take 2 tablets (50 mg total) by mouth daily once daily   tiZANidine  (ZANAFLEX ) 4 MG tablet Take 1 tablet (4 mg total) by mouth 3 (three) times daily as needed for muscle spasm.   tolterodine  (DETROL  LA) 2 MG 24 hr capsule Take 1 capsule (2 mg total) by mouth daily.   topiramate  (TOPAMAX ) 50 MG tablet Take 1 tablet (50 mg total) by  mouth 2 (two) times daily.   Facility-Administered Encounter Medications as of 04/29/2023  Medication   sodium chloride  irrigation 0.9 %     ONCOLOGIC FAMILY HISTORY:  Family History  Problem Relation Age of Onset   Lung cancer Father 73   Lung cancer Paternal Aunt        dx 22s   Lung cancer Paternal Uncle        dx 97s   Colon cancer Maternal Grandfather        dx 32s   Skin cancer Paternal Grandmother    Breast cancer Cousin        pat female cousin; dx 70s   Ovarian cancer Cousin        x2 pat cousins; dx 58s and dx 57s     SOCIAL HISTORY:  Social History   Socioeconomic History   Marital status: Married    Spouse name: Not on file   Number of children: Not on file   Years of education: Not on file   Highest education level: Not on file  Occupational History   Not on file  Tobacco Use   Smoking status: Never   Smokeless tobacco: Never  Vaping Use   Vaping status: Never Used  Substance and Sexual Activity   Alcohol use: No   Drug use: No   Sexual activity: Not on file  Other Topics Concern   Not on file  Social History Narrative   Not on file   Social Drivers of Health   Financial Resource Strain: Not on file  Food Insecurity: Not on file  Transportation Needs: Not on file  Physical Activity: Not on file  Stress: Not on file  Social Connections: Unknown (09/01/2021)   Received from [facility], [facility]   Social Network    Social Network: Not on file  Intimate Partner Violence: Unknown (07/24/2021)   Received from [employer], [facility]   HITS    Physically Hurt: Not on file    Insult or Talk Down To: Not on file    Threaten Physical Harm: Not on file    Scream or Curse: Not on file     OBSERVATIONS/OBJECTIVE:  BP (!) 148/74 (BP Location: Left Arm, Patient Position: Sitting)   Pulse 80   Temp (!) 97.2 F (36.2 C) (Temporal)   Resp 18   Wt 192 lb 6.4 oz (87.3 kg)   SpO2 98%   BMI 38.86 kg/m  GENERAL: Patient is a well  appearing female in no acute distress HEENT:  Sclerae anicteric.  Oropharynx clear and moist. No ulcerations or evidence of  oropharyngeal candidiasis. Neck is supple.  NODES:  No cervical, supraclavicular, or axillary lymphadenopathy palpated.  BREAST EXAM: Left breast status postlumpectomy and radiation no sign of local recurrence right breast is benign. LUNGS:  Clear to auscultation bilaterally.  No wheezes or rhonchi. HEART:  Regular rate and rhythm. No murmur appreciated. ABDOMEN:  Soft, nontender.  Positive, normoactive bowel sounds. No organomegaly palpated. MSK:  No focal spinal tenderness to palpation. Full range of motion bilaterally in the upper extremities. EXTREMITIES:  No peripheral edema.   SKIN:  Clear with no obvious rashes or skin changes. No nail dyscrasia. NEURO:  Nonfocal. Well oriented.  Appropriate affect.   LABORATORY DATA:  None for this visit.  DIAGNOSTIC IMAGING:  None for this visit.      ASSESSMENT AND PLAN:  Ms.. Krueger is a pleasant 73 y.o. female with Stage 0 left breast DCIS, ER+/PR+, diagnosed in 09/2022, treated with lumpectomy, adjuvant radiation therapy, and anti-estrogen therapy with Anastrozole  beginning in 01/2023.  She presents to the Survivorship Clinic for our initial meeting and routine follow-up post-completion of treatment for breast cancer.    1. Stage 0 left breast cancer:  Vicki Krueger is continuing to recover from definitive treatment for breast cancer. She will follow-up with her medical oncologist, Dr.  Odean in 6 months with history and physical exam per surveillance protocol.  She will continue her anti-estrogen therapy with Anastrozole . Thus far, she is tolerating the Anastrozole  well, with minimal side effects. Her mammogram is due 08/2023; orders placed today.   Today, a comprehensive survivorship care plan and treatment summary was reviewed with the patient today detailing her breast cancer diagnosis, treatment course, potential  late/long-term effects of treatment, appropriate follow-up care with recommendations for the future, and patient education resources.  A copy of this summary, along with a letter will be sent to the patient's primary care provider via mail/fax/In Basket message after today's visit.    2. Bone health:  Given Vicki Krueger's age/history of breast cancer and her current treatment regimen including anti-estrogen therapy with Anastrozole , she is at risk for bone demineralization.  She is scheduled for bone density testing in 11/2023.  She was given education on specific activities to promote bone health.  3. Cancer screening:  Due to Vicki Krueger's history and her age, she should receive screening for skin cancers, colon cancer, and gynecologic cancers.  The information and recommendations are listed on the patient's comprehensive care plan/treatment summary and were reviewed in detail with the patient.    4. Health maintenance and wellness promotion: Vicki Krueger was encouraged to consume 5-7 servings of fruits and vegetables per day. We reviewed the [employer] handout.  She was also encouraged to engage in moderate to vigorous exercise for 30 minutes per day most days of the week.  She was instructed to limit her alcohol consumption and continue to abstain from tobacco use.     5. Support services/counseling: It is not uncommon for this period of the patient's cancer care trajectory to be one of many emotions and stressors.   She was given information regarding our available services and encouraged to contact me with any questions or for help enrolling in any of our support group/programs.    Follow up instructions:    -Return to cancer center in 8 months for follow-up with Dr. Odean -Mammogram due in 08/2023 -Bone density testing in August 2025 -She is welcome to return back to the Survivorship Clinic at any time; no additional follow-up needed at  this time.  -Consider referral back to survivorship  as a long-term survivor for continued surveillance  The patient was provided an opportunity to ask questions and all were answered. The patient agreed with the plan and demonstrated an understanding of the instructions.   Total encounter time:45 minutes*in face-to-face visit time, chart review, lab review, care coordination, order entry, and documentation of the encounter time.    Morna Kendall, NP 04/29/23 10:50 AM Medical Oncology and Hematology [facility] [address] [place], [place] [address] Tel. [phone]    Fax. [phone]  *Total Encounter Time as defined by the Centers for Medicare and Medicaid Services includes, in addition to the face-to-face time of a patient visit (documented in the note above) non-face-to-face time: obtaining and reviewing outside history, ordering and reviewing medications, tests or procedures, care coordination (communications with other health care professionals or caregivers) and documentation in the medical record.

## 2023-05-13 ENCOUNTER — Other Ambulatory Visit (HOSPITAL_COMMUNITY): Payer: Self-pay

## 2023-05-13 DIAGNOSIS — Z853 Personal history of malignant neoplasm of breast: Secondary | ICD-10-CM | POA: Diagnosis not present

## 2023-05-13 DIAGNOSIS — Z780 Asymptomatic menopausal state: Secondary | ICD-10-CM | POA: Diagnosis not present

## 2023-05-13 DIAGNOSIS — N958 Other specified menopausal and perimenopausal disorders: Secondary | ICD-10-CM | POA: Diagnosis not present

## 2023-05-13 DIAGNOSIS — E2839 Other primary ovarian failure: Secondary | ICD-10-CM | POA: Diagnosis not present

## 2023-06-11 ENCOUNTER — Other Ambulatory Visit (HOSPITAL_COMMUNITY): Payer: Self-pay

## 2023-06-11 MED ORDER — TOLTERODINE TARTRATE ER 2 MG PO CP24
2.0000 mg | ORAL_CAPSULE | Freq: Every day | ORAL | 3 refills | Status: DC
  Filled 2023-06-11: qty 30, 30d supply, fill #0
  Filled 2023-07-13: qty 30, 30d supply, fill #1
  Filled 2023-08-13: qty 30, 30d supply, fill #2
  Filled 2023-09-16: qty 30, 30d supply, fill #3

## 2023-06-24 ENCOUNTER — Other Ambulatory Visit (HOSPITAL_COMMUNITY): Payer: Self-pay

## 2023-06-25 DIAGNOSIS — D0512 Intraductal carcinoma in situ of left breast: Secondary | ICD-10-CM | POA: Diagnosis not present

## 2023-07-13 ENCOUNTER — Other Ambulatory Visit (HOSPITAL_COMMUNITY): Payer: Self-pay

## 2023-07-13 ENCOUNTER — Other Ambulatory Visit: Payer: Self-pay

## 2023-07-22 ENCOUNTER — Other Ambulatory Visit (HOSPITAL_COMMUNITY): Payer: Self-pay

## 2023-07-22 DIAGNOSIS — G43919 Migraine, unspecified, intractable, without status migrainosus: Secondary | ICD-10-CM | POA: Diagnosis not present

## 2023-07-22 DIAGNOSIS — E782 Mixed hyperlipidemia: Secondary | ICD-10-CM | POA: Diagnosis not present

## 2023-07-22 DIAGNOSIS — K219 Gastro-esophageal reflux disease without esophagitis: Secondary | ICD-10-CM | POA: Diagnosis not present

## 2023-07-22 DIAGNOSIS — Z0001 Encounter for general adult medical examination with abnormal findings: Secondary | ICD-10-CM | POA: Diagnosis not present

## 2023-07-22 DIAGNOSIS — E559 Vitamin D deficiency, unspecified: Secondary | ICD-10-CM | POA: Diagnosis not present

## 2023-07-22 DIAGNOSIS — Z79899 Other long term (current) drug therapy: Secondary | ICD-10-CM | POA: Diagnosis not present

## 2023-07-22 DIAGNOSIS — Z853 Personal history of malignant neoplasm of breast: Secondary | ICD-10-CM | POA: Diagnosis not present

## 2023-07-22 DIAGNOSIS — I10 Essential (primary) hypertension: Secondary | ICD-10-CM | POA: Diagnosis not present

## 2023-07-22 DIAGNOSIS — G8929 Other chronic pain: Secondary | ICD-10-CM | POA: Diagnosis not present

## 2023-07-22 MED ORDER — FUROSEMIDE 20 MG PO TABS
20.0000 mg | ORAL_TABLET | Freq: Every day | ORAL | 1 refills | Status: AC | PRN
  Filled 2023-07-22: qty 26, 90d supply, fill #0
  Filled 2024-02-17: qty 26, 90d supply, fill #1

## 2023-07-22 MED ORDER — WEGOVY 0.25 MG/0.5ML ~~LOC~~ SOAJ
0.2500 mg | SUBCUTANEOUS | 2 refills | Status: DC
Start: 2023-07-22 — End: 2024-01-24
  Filled 2023-07-22: qty 2, 28d supply, fill #0

## 2023-07-22 MED ORDER — PHENTERMINE HCL 15 MG PO CAPS
15.0000 mg | ORAL_CAPSULE | Freq: Every day | ORAL | 2 refills | Status: DC
Start: 2023-07-22 — End: 2023-10-14
  Filled 2023-07-22: qty 30, 30d supply, fill #0
  Filled 2023-08-20: qty 30, 30d supply, fill #1
  Filled 2023-09-16: qty 30, 30d supply, fill #2

## 2023-08-09 ENCOUNTER — Other Ambulatory Visit: Payer: Self-pay

## 2023-08-09 ENCOUNTER — Other Ambulatory Visit (HOSPITAL_COMMUNITY): Payer: Self-pay

## 2023-08-09 MED ORDER — PEG-3350/ELECTROLYTES 236 G PO SOLR
ORAL | 0 refills | Status: DC
  Filled 2023-08-09: qty 4000, 1d supply, fill #0

## 2023-08-09 MED ORDER — BISACODYL 5 MG PO TBEC
DELAYED_RELEASE_TABLET | ORAL | 0 refills | Status: DC
  Filled 2023-08-09: qty 4, 2d supply, fill #0

## 2023-08-13 ENCOUNTER — Other Ambulatory Visit (HOSPITAL_COMMUNITY): Payer: Self-pay

## 2023-08-20 ENCOUNTER — Other Ambulatory Visit (HOSPITAL_COMMUNITY): Payer: Self-pay

## 2023-08-23 ENCOUNTER — Other Ambulatory Visit (HOSPITAL_COMMUNITY): Payer: Self-pay

## 2023-08-23 DIAGNOSIS — M5442 Lumbago with sciatica, left side: Secondary | ICD-10-CM | POA: Diagnosis not present

## 2023-08-23 DIAGNOSIS — M47816 Spondylosis without myelopathy or radiculopathy, lumbar region: Secondary | ICD-10-CM | POA: Diagnosis not present

## 2023-08-23 DIAGNOSIS — G8929 Other chronic pain: Secondary | ICD-10-CM | POA: Diagnosis not present

## 2023-08-23 DIAGNOSIS — M533 Sacrococcygeal disorders, not elsewhere classified: Secondary | ICD-10-CM | POA: Diagnosis not present

## 2023-08-23 MED ORDER — TRAMADOL HCL 50 MG PO TABS
50.0000 mg | ORAL_TABLET | Freq: Two times a day (BID) | ORAL | 0 refills | Status: DC | PRN
Start: 2023-08-23 — End: 2024-01-24
  Filled 2023-08-23: qty 28, 14d supply, fill #0

## 2023-09-02 DIAGNOSIS — M533 Sacrococcygeal disorders, not elsewhere classified: Secondary | ICD-10-CM | POA: Diagnosis not present

## 2023-09-10 ENCOUNTER — Other Ambulatory Visit (HOSPITAL_COMMUNITY): Payer: Self-pay

## 2023-09-16 ENCOUNTER — Ambulatory Visit
Admission: RE | Admit: 2023-09-16 | Discharge: 2023-09-16 | Disposition: A | Discharge status: Home / Self Care | Payer: Commercial Managed Care - PPO | Source: Ambulatory Visit | Attending: Adult Health | Admitting: Adult Health

## 2023-09-16 ENCOUNTER — Other Ambulatory Visit: Payer: Self-pay | Admitting: Adult Health

## 2023-09-16 ENCOUNTER — Other Ambulatory Visit (HOSPITAL_COMMUNITY): Payer: Self-pay

## 2023-09-16 ENCOUNTER — Other Ambulatory Visit: Payer: Self-pay

## 2023-09-16 DIAGNOSIS — D0512 Intraductal carcinoma in situ of left breast: Secondary | ICD-10-CM

## 2023-09-16 DIAGNOSIS — Z86000 Personal history of in-situ neoplasm of breast: Secondary | ICD-10-CM | POA: Diagnosis not present

## 2023-09-16 DIAGNOSIS — R920 Mammographic microcalcification found on diagnostic imaging of breast: Secondary | ICD-10-CM | POA: Diagnosis not present

## 2023-09-16 DIAGNOSIS — R921 Mammographic calcification found on diagnostic imaging of breast: Secondary | ICD-10-CM

## 2023-09-16 MED ORDER — TOPIRAMATE 50 MG PO TABS
100.0000 mg | ORAL_TABLET | Freq: Every day | ORAL | 3 refills | Status: AC
Start: 2023-09-16 — End: ?
  Filled 2023-09-16: qty 180, 90d supply, fill #0
  Filled 2023-12-07: qty 180, 90d supply, fill #1
  Filled 2024-03-14: qty 180, 90d supply, fill #2

## 2023-09-17 DIAGNOSIS — Z1211 Encounter for screening for malignant neoplasm of colon: Secondary | ICD-10-CM | POA: Diagnosis not present

## 2023-09-17 DIAGNOSIS — K6389 Other specified diseases of intestine: Secondary | ICD-10-CM | POA: Diagnosis not present

## 2023-09-17 DIAGNOSIS — K529 Noninfective gastroenteritis and colitis, unspecified: Secondary | ICD-10-CM | POA: Diagnosis not present

## 2023-09-17 DIAGNOSIS — K6289 Other specified diseases of anus and rectum: Secondary | ICD-10-CM | POA: Diagnosis not present

## 2023-09-20 ENCOUNTER — Ambulatory Visit
Admission: RE | Admit: 2023-09-20 | Discharge: 2023-09-20 | Disposition: A | Discharge status: Home / Self Care | Source: Ambulatory Visit | Attending: Adult Health | Admitting: Adult Health

## 2023-09-20 ENCOUNTER — Other Ambulatory Visit (HOSPITAL_COMMUNITY): Payer: Self-pay

## 2023-09-20 DIAGNOSIS — R921 Mammographic calcification found on diagnostic imaging of breast: Secondary | ICD-10-CM

## 2023-09-20 DIAGNOSIS — D0512 Intraductal carcinoma in situ of left breast: Secondary | ICD-10-CM

## 2023-09-21 ENCOUNTER — Other Ambulatory Visit: Payer: Self-pay | Admitting: Adult Health

## 2023-09-21 ENCOUNTER — Encounter: Payer: Self-pay | Admitting: Adult Health

## 2023-09-21 DIAGNOSIS — R921 Mammographic calcification found on diagnostic imaging of breast: Secondary | ICD-10-CM

## 2023-10-14 ENCOUNTER — Other Ambulatory Visit (HOSPITAL_COMMUNITY): Payer: Self-pay

## 2023-10-14 MED ORDER — TOLTERODINE TARTRATE ER 2 MG PO CP24
2.0000 mg | ORAL_CAPSULE | Freq: Every day | ORAL | 3 refills | Status: DC
Start: 2023-10-14 — End: 2024-01-24
  Filled 2023-10-14: qty 30, 30d supply, fill #0
  Filled 2023-11-19 (×2): qty 30, 30d supply, fill #1
  Filled 2023-12-07 – 2023-12-21 (×3): qty 30, 30d supply, fill #2
  Filled 2024-01-21: qty 30, 30d supply, fill #3

## 2023-10-14 MED ORDER — PHENTERMINE HCL 15 MG PO CAPS
15.0000 mg | ORAL_CAPSULE | Freq: Every day | ORAL | 2 refills | Status: DC
Start: 2023-10-14 — End: 2024-01-21
  Filled 2023-10-14: qty 30, 30d supply, fill #0
  Filled 2023-11-19: qty 30, 30d supply, fill #1
  Filled 2023-12-07 – 2023-12-21 (×2): qty 30, 30d supply, fill #2

## 2023-10-14 MED ORDER — SPIRONOLACTONE 25 MG PO TABS
50.0000 mg | ORAL_TABLET | Freq: Every day | ORAL | 3 refills | Status: AC
  Filled 2023-10-14: qty 180, 90d supply, fill #0
  Filled 2024-01-12: qty 180, 90d supply, fill #1
  Filled 2024-04-11: qty 180, 90d supply, fill #2

## 2023-10-15 ENCOUNTER — Other Ambulatory Visit (HOSPITAL_COMMUNITY): Payer: Self-pay

## 2023-10-19 ENCOUNTER — Other Ambulatory Visit (HOSPITAL_COMMUNITY): Payer: Self-pay

## 2023-11-19 ENCOUNTER — Other Ambulatory Visit (HOSPITAL_COMMUNITY): Payer: Self-pay

## 2023-11-21 DIAGNOSIS — B308 Other viral conjunctivitis: Secondary | ICD-10-CM | POA: Diagnosis not present

## 2023-11-22 ENCOUNTER — Other Ambulatory Visit: Payer: Commercial Managed Care - PPO

## 2023-12-07 ENCOUNTER — Other Ambulatory Visit (HOSPITAL_COMMUNITY): Payer: Self-pay

## 2023-12-07 MED ORDER — ATORVASTATIN CALCIUM 10 MG PO TABS
10.0000 mg | ORAL_TABLET | Freq: Every day | ORAL | 1 refills | Status: AC
  Filled 2023-12-07: qty 90, 90d supply, fill #0
  Filled 2024-03-14: qty 90, 90d supply, fill #1

## 2023-12-07 MED ORDER — METOPROLOL SUCCINATE ER 25 MG PO TB24
25.0000 mg | ORAL_TABLET | Freq: Every day | ORAL | 1 refills | Status: AC
  Filled 2023-12-07: qty 90, 90d supply, fill #0
  Filled 2024-03-10: qty 90, 90d supply, fill #1

## 2023-12-08 ENCOUNTER — Other Ambulatory Visit (HOSPITAL_COMMUNITY): Payer: Self-pay

## 2023-12-09 ENCOUNTER — Other Ambulatory Visit (HOSPITAL_COMMUNITY): Payer: Self-pay

## 2023-12-21 ENCOUNTER — Other Ambulatory Visit (HOSPITAL_COMMUNITY): Payer: Self-pay

## 2023-12-21 ENCOUNTER — Other Ambulatory Visit: Payer: Self-pay

## 2023-12-22 ENCOUNTER — Other Ambulatory Visit (HOSPITAL_COMMUNITY): Payer: Self-pay

## 2024-01-12 ENCOUNTER — Other Ambulatory Visit: Payer: Self-pay | Admitting: Hematology and Oncology

## 2024-01-12 ENCOUNTER — Other Ambulatory Visit (HOSPITAL_COMMUNITY): Payer: Self-pay

## 2024-01-12 MED ORDER — ANASTROZOLE 1 MG PO TABS
1.0000 mg | ORAL_TABLET | Freq: Every day | ORAL | 3 refills | Status: AC
  Filled 2024-01-12: qty 90, 90d supply, fill #0
  Filled 2024-04-18: qty 90, 90d supply, fill #1

## 2024-01-21 ENCOUNTER — Other Ambulatory Visit (HOSPITAL_COMMUNITY): Payer: Self-pay

## 2024-01-21 MED ORDER — PHENTERMINE HCL 15 MG PO CAPS
15.0000 mg | ORAL_CAPSULE | Freq: Every day | ORAL | 2 refills | Status: DC
  Filled 2024-01-21: qty 30, 30d supply, fill #0
  Filled 2024-02-22: qty 30, 30d supply, fill #1
  Filled 2024-03-22: qty 30, 30d supply, fill #2

## 2024-01-24 ENCOUNTER — Inpatient Hospital Stay: Attending: Hematology and Oncology | Admitting: Hematology and Oncology

## 2024-01-24 ENCOUNTER — Other Ambulatory Visit (HOSPITAL_COMMUNITY): Payer: Self-pay

## 2024-01-24 VITALS — BP 143/75 | HR 82 | Temp 97.9°F | Resp 18 | Ht 59.0 in | Wt 178.9 lb

## 2024-01-24 DIAGNOSIS — Z79811 Long term (current) use of aromatase inhibitors: Secondary | ICD-10-CM | POA: Diagnosis not present

## 2024-01-24 DIAGNOSIS — D0512 Intraductal carcinoma in situ of left breast: Secondary | ICD-10-CM | POA: Insufficient documentation

## 2024-01-24 DIAGNOSIS — N941 Unspecified dyspareunia: Secondary | ICD-10-CM | POA: Diagnosis not present

## 2024-01-24 DIAGNOSIS — Z6835 Body mass index (BMI) 35.0-35.9, adult: Secondary | ICD-10-CM | POA: Diagnosis not present

## 2024-01-24 DIAGNOSIS — Z780 Asymptomatic menopausal state: Secondary | ICD-10-CM | POA: Diagnosis not present

## 2024-01-24 DIAGNOSIS — Z01419 Encounter for gynecological examination (general) (routine) without abnormal findings: Secondary | ICD-10-CM | POA: Diagnosis not present

## 2024-01-24 DIAGNOSIS — R32 Unspecified urinary incontinence: Secondary | ICD-10-CM | POA: Diagnosis not present

## 2024-01-24 MED ORDER — TOLTERODINE TARTRATE ER 2 MG PO CP24
2.0000 mg | ORAL_CAPSULE | Freq: Every day | ORAL | 1 refills | Status: DC
  Filled 2024-01-24 – 2024-02-17 (×2): qty 30, 30d supply, fill #0
  Filled 2024-03-22: qty 30, 30d supply, fill #1

## 2024-01-24 NOTE — Assessment & Plan Note (Signed)
 10/09/2022:Screening mammogram detected left breast calcifications measuring 0.3 cm, stereotactic biopsy with intermediate grade DCIS with necrosis and calcifications ER 100%, PR 80%  11/12/2022: No residual DCIS identified in the surgery specimen. 12/24/2022-01/12/2023: Adjuvant radiation   Treatment plan: Adjuvant anastrozole  x 5 years (chosen because she has a previous history of blood clot) Anastrozole  toxicities:  Surveillance of breast cancer: Breast exam 01/24/2024: Benign Mammogram 09/20/2023: Stereotactic biopsy was attempted but the calcifications could not be reproduced.  50-month follow-up mammogram to be done in December 2025  Return to clinic in 1 year for follow-up

## 2024-01-24 NOTE — Progress Notes (Signed)
 Patient Care Team: Dwight Trula SQUIBB, MD as PCP - General (Internal Medicine) Odean Potts, MD as Consulting Physician (Hematology and Oncology) Izell Domino, MD as Attending Physician (Radiation Oncology) Vernetta Berg, MD as Consulting Physician (General Surgery)  DIAGNOSIS:  Encounter Diagnoses  Name Primary?   Postmenopausal estrogen deficiency    Ductal carcinoma in situ (DCIS) of left breast Yes    SUMMARY OF ONCOLOGIC HISTORY: Oncology History  Ductal carcinoma in situ (DCIS) of left breast  10/09/2022 Initial Diagnosis   Screening mammogram detected left breast calcifications measuring 0.3 cm, stereotactic biopsy with intermediate grade DCIS with necrosis and calcifications ER 100%, PR 80%   10/21/2022 Cancer Staging   Staging form: Breast, AJCC 8th Edition - Clinical: Stage 0 (cTis (DCIS), cN0, cM0, G2, ER+, PR+, HER2: Not Assessed) - Signed by Odean Potts, MD on 10/21/2022 Stage prefix: Initial diagnosis Histologic grading system: 3 grade system   10/27/2022 Genetic Testing   Negative [facility] Common Hereditary Cancers +RNA Panel.  Report date is 10/27/2022.    The [facility] Common Hereditary Cancers + RNA Panel includes sequencing, deletion/duplication, and RNA analysis of the following 48 genes: APC, ATM, AXIN2, BAP1, BARD1, BMPR1A, BRCA1, BRCA2, BRIP1, CDH1, CDK4*, CDKN2A*, CHEK2, CTNNA1, DICER1, EPCAM* (del/dup only), FH, GREM1* (promoter dup analysis only), HOXB13*, KIT*, MBD4*, MEN1, MLH1, MSH2, MSH3, MSH6, MUTYH, NF1, NTHL1, PALB2, PDGFRA*, PMS2, POLD1, POLE, PTEN, RAD51C, RAD51D, SDHA (sequencing only), SDHB, SDHC, SDHD, SMAD4, SMARCA4, STK11, TP53, TSC1, TSC2, VHL.  *Genes without RNA analysis.    11/12/2022 Surgery   Left lumpectomy: No residual DCIS identified.   12/08/2022 Cancer Staging   Staging form: Breast, AJCC 8th Edition - Pathologic stage from 12/08/2022: Stage 0 (pTis (DCIS), pN0, cM0, ER+, PR+) - Signed by Crawford Morna Pickle, NP on 04/29/2023 Stage  prefix: Initial diagnosis Method of lymph node assessment: Axillary lymph node dissection Nuclear grade: G2 Multigene prognostic tests performed: None   12/23/2022 - 01/12/2023 Radiation Therapy   Plan Name: Breast_L_BH Site: Breast, Left Technique: 3D Mode: Photon Dose Per Fraction: 2.67 Gy Prescribed Dose (Delivered / Prescribed): 40.05 Gy / 40.05 Gy Prescribed Fxs (Delivered / Prescribed): 15 / 15   01/2023 -  Anti-estrogen oral therapy   Anastrozole  x 5 years     CHIEF COMPLIANT:   HISTORY OF PRESENT ILLNESS:  History of Present Illness Vicki Krueger is a 73 year old female who presents for a follow-up visit regarding her breast health and medication management.  She underwent a mammogram in June following a recommendation for a biopsy. The radiologist found no concerning findings and advised a follow-up in six months. She experiences some tenderness in the breast area but notes no increase in the size of any lumps.  She attempted to use Wegovy  for weight loss but found it cost-prohibitive. She has been using phentermine  instead, resulting in a weight loss of 15 to 18 pounds.     ALLERGIES:  is allergic to ergotamine-caffeine, aspirin, other, pseudoephedrine, stadol [butorphanol], doxycycline, and morphine and codeine.  MEDICATIONS:  Current Outpatient Medications  Medication Sig Dispense Refill   anastrozole  (ARIMIDEX ) 1 MG tablet Take 1 tablet (1 mg total) by mouth daily. 90 tablet 3   atorvastatin  (LIPITOR) 10 MG tablet Take 1 tablet (10 mg total) by mouth daily. 90 tablet 1   cholecalciferol (VITAMIN D) 1000 UNITS tablet Take 1,000 Units by mouth daily.     cyclobenzaprine (FLEXERIL) 10 MG tablet Take 10 mg by mouth at bedtime as needed for muscle spasms.  furosemide  (LASIX ) 20 MG tablet Take 1 tablet (20 mg total) by mouth daily as needed with a maximum of 2 days per week. 30 tablet 1   metoprolol  succinate (TOPROL -XL) 25 MG 24 hr tablet Take 1 tablet (25 mg total)  by mouth daily. 90 tablet 1   Multiple Vitamins-Minerals (CENTRUM SILVER ADULT 50+) TABS Take 1 tablet by mouth daily.     omeprazole  (PRILOSEC) 40 MG capsule Take 1 capsule (40 mg total) by mouth 1 to 2 times daily. 180 capsule 1   phentermine  15 MG capsule Take 1 capsule (15 mg total) by mouth daily. 30 capsule 2   spironolactone  (ALDACTONE ) 25 MG tablet Take 2 tablets (50 mg total) by mouth daily. 180 tablet 3   tolterodine  (DETROL  LA) 2 MG 24 hr capsule Take 1 capsule (2 mg total) by mouth daily. 30 capsule 3   topiramate  (TOPAMAX ) 50 MG tablet Take 2 tablets (100 mg total) by mouth once daily. 180 tablet 3   No current facility-administered medications for this visit.   Facility-Administered Medications Ordered in Other Visits  Medication Dose Route Frequency Provider Last Rate Last Admin   sodium chloride  irrigation 0.9 %    PRN Gerome Charleston, MD   6,000 mL at 06/12/11 0705    PHYSICAL EXAMINATION: ECOG PERFORMANCE STATUS: 1 - Symptomatic but completely ambulatory  Vitals:   01/24/24 0915  BP: (!) 143/75  Pulse: 82  Resp: 18  Temp: 97.9 F (36.6 C)  SpO2: 100%   Filed Weights   01/24/24 0915  Weight: 178 lb 14.4 oz (81.1 kg)    Physical Exam BREAST: Breasts normal, scar appears okay.  (exam performed in the presence of a chaperone)  LABORATORY DATA:  I have reviewed the data as listed    Latest Ref Rng & Units 10/21/2022    1:05 PM 04/09/2021    1:57 PM 03/07/2018   10:13 PM  CMP  Glucose 70 - 99 mg/dL 98  895  882   BUN 8 - 23 mg/dL 15  16  17    Creatinine 0.44 - 1.00 mg/dL 8.83  9.02  9.09   Sodium 135 - 145 mmol/L 143  144  141   Potassium 3.5 - 5.1 mmol/L 3.9  3.7  3.5   Chloride 98 - 111 mmol/L 108  110  110   CO2 22 - 32 mmol/L 27  26    Calcium  8.9 - 10.3 mg/dL 9.9  9.2    Total Protein 6.5 - 8.1 g/dL 6.7     Total Bilirubin 0.3 - 1.2 mg/dL 0.3     Alkaline Phos 38 - 126 U/L 99     AST 15 - 41 U/L 22     ALT 0 - 44 U/L 21       Lab Results   Component Value Date   WBC 6.8 10/21/2022   HGB 14.3 10/21/2022   HCT 44.0 10/21/2022   MCV 96.3 10/21/2022   PLT 193 10/21/2022   NEUTROABS 3.8 10/21/2022    ASSESSMENT & PLAN:  Ductal carcinoma in situ (DCIS) of left breast 10/09/2022:Screening mammogram detected left breast calcifications measuring 0.3 cm, stereotactic biopsy with intermediate grade DCIS with necrosis and calcifications ER 100%, PR 80%  11/12/2022: No residual DCIS identified in the surgery specimen. 12/24/2022-01/12/2023: Adjuvant radiation   Treatment plan: Adjuvant anastrozole  x 5 years (chosen because she has a previous history of blood clot) Anastrozole  toxicities: Slight joint pains Mild hot flashes  Surveillance of breast cancer:  Breast exam 01/24/2024: Benign Mammogram 09/20/2023: Stereotactic biopsy was attempted but the calcifications could not be reproduced.  50-month follow-up mammogram to be done in December 2025  Return to clinic in 1 year for follow-up   Assessment & Plan Ductal carcinoma in situ (DCIS) of left breast No recurrence or progression on recent mammogram. - Continue current management. - Follow-up mammogram in December. - Reassess in one year unless new symptoms arise.  Asymptomatic menopausal state Mild ankle aching likely due to anastrozole . Tolerating medication well. - Continue anastrozole  as prescribed.  Chronic back issue Recent injection provided temporary relief. Some discomfort persists but functional at work. - Encourage continuation of stretches and exercises.      No orders of the defined types were placed in this encounter.  The patient has a good understanding of the overall plan. she agrees with it. she will call with any problems that may develop before the next visit here. Total time spent: 30 mins including face to face time and time spent for planning, charting and co-ordination of care   Naomi MARLA Chad, MD 01/24/24

## 2024-02-17 ENCOUNTER — Other Ambulatory Visit (HOSPITAL_COMMUNITY): Payer: Self-pay

## 2024-02-17 MED ORDER — FLUZONE HIGH-DOSE 0.5 ML IM SUSY
0.5000 mL | PREFILLED_SYRINGE | Freq: Once | INTRAMUSCULAR | 0 refills | Status: AC
  Filled 2024-02-17: qty 0.5, 1d supply, fill #0

## 2024-02-17 MED ORDER — OMEPRAZOLE 40 MG PO CPDR
40.0000 mg | DELAYED_RELEASE_CAPSULE | Freq: Two times a day (BID) | ORAL | 0 refills | Status: AC
  Filled 2024-02-17: qty 180, 90d supply, fill #0

## 2024-02-22 ENCOUNTER — Other Ambulatory Visit (HOSPITAL_COMMUNITY): Payer: Self-pay

## 2024-03-10 ENCOUNTER — Other Ambulatory Visit (HOSPITAL_COMMUNITY): Payer: Self-pay

## 2024-03-14 ENCOUNTER — Other Ambulatory Visit (HOSPITAL_COMMUNITY): Payer: Self-pay

## 2024-03-22 ENCOUNTER — Other Ambulatory Visit: Payer: Self-pay

## 2024-03-22 ENCOUNTER — Other Ambulatory Visit (HOSPITAL_COMMUNITY): Payer: Self-pay

## 2024-03-23 ENCOUNTER — Encounter

## 2024-03-30 ENCOUNTER — Ambulatory Visit
Admission: RE | Admit: 2024-03-30 | Discharge: 2024-03-30 | Disposition: A | Discharge status: Home / Self Care | Source: Ambulatory Visit | Attending: Adult Health | Admitting: Adult Health

## 2024-03-30 ENCOUNTER — Other Ambulatory Visit: Payer: Self-pay | Admitting: Adult Health

## 2024-03-30 DIAGNOSIS — R928 Other abnormal and inconclusive findings on diagnostic imaging of breast: Secondary | ICD-10-CM

## 2024-03-30 DIAGNOSIS — K1321 Leukoplakia of oral mucosa, including tongue: Secondary | ICD-10-CM | POA: Diagnosis not present

## 2024-03-30 DIAGNOSIS — K141 Geographic tongue: Secondary | ICD-10-CM | POA: Diagnosis not present

## 2024-03-30 DIAGNOSIS — R921 Mammographic calcification found on diagnostic imaging of breast: Secondary | ICD-10-CM

## 2024-03-30 DIAGNOSIS — K136 Irritative hyperplasia of oral mucosa: Secondary | ICD-10-CM | POA: Diagnosis not present

## 2024-04-11 ENCOUNTER — Other Ambulatory Visit (HOSPITAL_COMMUNITY): Payer: Self-pay

## 2024-04-18 ENCOUNTER — Other Ambulatory Visit (HOSPITAL_COMMUNITY): Payer: Self-pay

## 2024-04-18 ENCOUNTER — Other Ambulatory Visit: Payer: Self-pay

## 2024-04-18 MED ORDER — PHENTERMINE HCL 15 MG PO CAPS
15.0000 mg | ORAL_CAPSULE | Freq: Every day | ORAL | 2 refills | Status: AC
  Filled 2024-04-19: qty 30, 30d supply, fill #0
  Filled 2024-05-23: qty 30, 30d supply, fill #1

## 2024-04-18 MED ORDER — TOLTERODINE TARTRATE ER 2 MG PO CP24
2.0000 mg | ORAL_CAPSULE | Freq: Every day | ORAL | 1 refills | Status: AC
  Filled 2024-04-18: qty 30, 30d supply, fill #0
  Filled 2024-05-23: qty 30, 30d supply, fill #1

## 2024-04-19 ENCOUNTER — Other Ambulatory Visit (HOSPITAL_COMMUNITY): Payer: Self-pay

## 2024-05-23 ENCOUNTER — Other Ambulatory Visit (HOSPITAL_COMMUNITY): Payer: Self-pay

## 2024-05-24 ENCOUNTER — Other Ambulatory Visit (HOSPITAL_COMMUNITY): Payer: Self-pay

## 2024-05-24 MED ORDER — AMOXICILLIN 500 MG PO CAPS
500.0000 mg | ORAL_CAPSULE | Freq: Three times a day (TID) | ORAL | 0 refills | Status: AC
  Filled 2024-05-24: qty 21, 7d supply, fill #0

## 2024-10-02 ENCOUNTER — Encounter

## 2024-10-05 ENCOUNTER — Encounter
